# Patient Record
Sex: Female | Born: 1945 | Race: White | Hispanic: No | Smoking: Former smoker
Health system: Southern US, Community
[De-identification: ages and names within clinical notes are randomized; demographics above are authoritative.]

## PROBLEM LIST (undated history)

## (undated) DIAGNOSIS — I10 Essential (primary) hypertension: Secondary | ICD-10-CM

## (undated) DIAGNOSIS — K623 Rectal prolapse: Secondary | ICD-10-CM

## (undated) DIAGNOSIS — M1990 Unspecified osteoarthritis, unspecified site: Secondary | ICD-10-CM

## (undated) DIAGNOSIS — N200 Calculus of kidney: Secondary | ICD-10-CM

## (undated) DIAGNOSIS — K219 Gastro-esophageal reflux disease without esophagitis: Secondary | ICD-10-CM

## (undated) DIAGNOSIS — J45909 Unspecified asthma, uncomplicated: Secondary | ICD-10-CM

## (undated) DIAGNOSIS — G4730 Sleep apnea, unspecified: Secondary | ICD-10-CM

## (undated) DIAGNOSIS — D649 Anemia, unspecified: Secondary | ICD-10-CM

## (undated) DIAGNOSIS — K279 Peptic ulcer, site unspecified, unspecified as acute or chronic, without hemorrhage or perforation: Secondary | ICD-10-CM

## (undated) DIAGNOSIS — F419 Anxiety disorder, unspecified: Secondary | ICD-10-CM

## (undated) DIAGNOSIS — M797 Fibromyalgia: Secondary | ICD-10-CM

## (undated) DIAGNOSIS — E785 Hyperlipidemia, unspecified: Secondary | ICD-10-CM

## (undated) DIAGNOSIS — K8020 Calculus of gallbladder without cholecystitis without obstruction: Secondary | ICD-10-CM

## (undated) DIAGNOSIS — E039 Hypothyroidism, unspecified: Secondary | ICD-10-CM

## (undated) DIAGNOSIS — N390 Urinary tract infection, site not specified: Secondary | ICD-10-CM

## (undated) HISTORY — DX: Gastro-esophageal reflux disease without esophagitis: K21.9

## (undated) HISTORY — DX: Anxiety disorder, unspecified: F41.9

## (undated) HISTORY — PX: SHOULDER ARTHROSCOPY: SHX128

## (undated) HISTORY — DX: Essential (primary) hypertension: I10

## (undated) HISTORY — DX: Hyperlipidemia, unspecified: E78.5

## (undated) HISTORY — DX: Sleep apnea, unspecified: G47.30

## (undated) HISTORY — DX: Calculus of gallbladder without cholecystitis without obstruction: K80.20

## (undated) HISTORY — DX: Unspecified asthma, uncomplicated: J45.909

## (undated) HISTORY — DX: Rectal prolapse: K62.3

## (undated) HISTORY — DX: Anemia, unspecified: D64.9

## (undated) HISTORY — PX: CHOLECYSTECTOMY: SHX55

## (undated) HISTORY — PX: ELBOW ARTHROSCOPY: SUR87

## (undated) HISTORY — DX: Unspecified osteoarthritis, unspecified site: M19.90

## (undated) HISTORY — PX: LAPAROSCOPIC ASSISTED VAGINAL HYSTERECTOMY: SHX5398

## (undated) HISTORY — PX: OTHER SURGICAL HISTORY: SHX169

## (undated) HISTORY — DX: Hypothyroidism, unspecified: E03.9

## (undated) HISTORY — DX: Urinary tract infection, site not specified: N39.0

## (undated) HISTORY — DX: Calculus of kidney: N20.0

## (undated) HISTORY — PX: TONSILLECTOMY: SUR1361

## (undated) HISTORY — DX: Peptic ulcer, site unspecified, unspecified as acute or chronic, without hemorrhage or perforation: K27.9

## (undated) HISTORY — DX: Fibromyalgia: M79.7

---

## 1980-08-28 DIAGNOSIS — E063 Autoimmune thyroiditis: Secondary | ICD-10-CM | POA: Insufficient documentation

## 2000-08-28 DIAGNOSIS — M47819 Spondylosis without myelopathy or radiculopathy, site unspecified: Secondary | ICD-10-CM | POA: Insufficient documentation

## 2011-02-26 DIAGNOSIS — I10 Essential (primary) hypertension: Secondary | ICD-10-CM | POA: Insufficient documentation

## 2011-05-10 DIAGNOSIS — M3501 Sicca syndrome with keratoconjunctivitis: Secondary | ICD-10-CM | POA: Insufficient documentation

## 2011-05-10 DIAGNOSIS — H16229 Keratoconjunctivitis sicca, not specified as Sjogren's, unspecified eye: Secondary | ICD-10-CM | POA: Insufficient documentation

## 2011-05-10 DIAGNOSIS — H04129 Dry eye syndrome of unspecified lacrimal gland: Secondary | ICD-10-CM | POA: Insufficient documentation

## 2011-05-10 DIAGNOSIS — H2510 Age-related nuclear cataract, unspecified eye: Secondary | ICD-10-CM | POA: Insufficient documentation

## 2011-05-27 DIAGNOSIS — M797 Fibromyalgia: Secondary | ICD-10-CM | POA: Insufficient documentation

## 2011-07-02 DIAGNOSIS — K2100 Gastro-esophageal reflux disease with esophagitis, without bleeding: Secondary | ICD-10-CM | POA: Insufficient documentation

## 2012-04-23 DIAGNOSIS — K1120 Sialoadenitis, unspecified: Secondary | ICD-10-CM | POA: Insufficient documentation

## 2012-07-08 DIAGNOSIS — G250 Essential tremor: Secondary | ICD-10-CM | POA: Insufficient documentation

## 2012-11-26 DIAGNOSIS — L609 Nail disorder, unspecified: Secondary | ICD-10-CM | POA: Insufficient documentation

## 2013-01-12 DIAGNOSIS — Z0000 Encounter for general adult medical examination without abnormal findings: Secondary | ICD-10-CM | POA: Insufficient documentation

## 2013-01-12 DIAGNOSIS — R0602 Shortness of breath: Secondary | ICD-10-CM | POA: Insufficient documentation

## 2013-01-12 DIAGNOSIS — E039 Hypothyroidism, unspecified: Secondary | ICD-10-CM | POA: Insufficient documentation

## 2013-05-12 DIAGNOSIS — J45909 Unspecified asthma, uncomplicated: Secondary | ICD-10-CM | POA: Insufficient documentation

## 2013-05-12 DIAGNOSIS — R2689 Other abnormalities of gait and mobility: Secondary | ICD-10-CM | POA: Insufficient documentation

## 2013-05-12 DIAGNOSIS — L900 Lichen sclerosus et atrophicus: Secondary | ICD-10-CM | POA: Insufficient documentation

## 2014-12-27 DIAGNOSIS — B379 Candidiasis, unspecified: Secondary | ICD-10-CM | POA: Insufficient documentation

## 2015-01-24 DIAGNOSIS — M205X9 Other deformities of toe(s) (acquired), unspecified foot: Secondary | ICD-10-CM | POA: Insufficient documentation

## 2015-07-18 DIAGNOSIS — N941 Unspecified dyspareunia: Secondary | ICD-10-CM | POA: Insufficient documentation

## 2015-08-29 DIAGNOSIS — N3281 Overactive bladder: Secondary | ICD-10-CM | POA: Insufficient documentation

## 2015-08-29 DIAGNOSIS — M62838 Other muscle spasm: Secondary | ICD-10-CM | POA: Insufficient documentation

## 2016-07-02 DIAGNOSIS — Z9181 History of falling: Secondary | ICD-10-CM | POA: Insufficient documentation

## 2016-12-31 DIAGNOSIS — H9192 Unspecified hearing loss, left ear: Secondary | ICD-10-CM | POA: Insufficient documentation

## 2016-12-31 DIAGNOSIS — H9193 Unspecified hearing loss, bilateral: Secondary | ICD-10-CM | POA: Insufficient documentation

## 2016-12-31 DIAGNOSIS — G8929 Other chronic pain: Secondary | ICD-10-CM | POA: Insufficient documentation

## 2016-12-31 DIAGNOSIS — E785 Hyperlipidemia, unspecified: Secondary | ICD-10-CM | POA: Insufficient documentation

## 2016-12-31 DIAGNOSIS — R29898 Other symptoms and signs involving the musculoskeletal system: Secondary | ICD-10-CM | POA: Insufficient documentation

## 2017-02-20 DIAGNOSIS — H0288B Meibomian gland dysfunction left eye, upper and lower eyelids: Secondary | ICD-10-CM | POA: Insufficient documentation

## 2017-02-20 DIAGNOSIS — H0288A Meibomian gland dysfunction right eye, upper and lower eyelids: Secondary | ICD-10-CM | POA: Insufficient documentation

## 2017-02-20 DIAGNOSIS — H04123 Dry eye syndrome of bilateral lacrimal glands: Secondary | ICD-10-CM | POA: Insufficient documentation

## 2017-05-21 DIAGNOSIS — H0102B Squamous blepharitis left eye, upper and lower eyelids: Secondary | ICD-10-CM | POA: Insufficient documentation

## 2017-05-21 DIAGNOSIS — H0102A Squamous blepharitis right eye, upper and lower eyelids: Secondary | ICD-10-CM | POA: Insufficient documentation

## 2017-05-21 DIAGNOSIS — R55 Syncope and collapse: Secondary | ICD-10-CM | POA: Insufficient documentation

## 2017-05-21 DIAGNOSIS — R42 Dizziness and giddiness: Secondary | ICD-10-CM | POA: Insufficient documentation

## 2017-05-21 DIAGNOSIS — R200 Anesthesia of skin: Secondary | ICD-10-CM | POA: Insufficient documentation

## 2017-08-19 DIAGNOSIS — M79671 Pain in right foot: Secondary | ICD-10-CM | POA: Insufficient documentation

## 2017-08-19 DIAGNOSIS — M79672 Pain in left foot: Secondary | ICD-10-CM | POA: Insufficient documentation

## 2017-08-28 DIAGNOSIS — R251 Tremor, unspecified: Secondary | ICD-10-CM | POA: Insufficient documentation

## 2018-02-18 DIAGNOSIS — M26623 Arthralgia of bilateral temporomandibular joint: Secondary | ICD-10-CM | POA: Insufficient documentation

## 2019-02-06 DIAGNOSIS — Z634 Disappearance and death of family member: Secondary | ICD-10-CM | POA: Insufficient documentation

## 2019-10-13 DIAGNOSIS — R195 Other fecal abnormalities: Secondary | ICD-10-CM | POA: Insufficient documentation

## 2020-10-10 ENCOUNTER — Encounter: Payer: Self-pay | Admitting: Pulmonary Disease

## 2020-10-10 ENCOUNTER — Ambulatory Visit (INDEPENDENT_AMBULATORY_CARE_PROVIDER_SITE_OTHER): Payer: Medicare Other | Admitting: Pulmonary Disease

## 2020-10-10 ENCOUNTER — Other Ambulatory Visit: Payer: Self-pay

## 2020-10-10 VITALS — BP 128/76 | HR 91 | Temp 98.3°F | Ht 64.0 in | Wt 158.4 lb

## 2020-10-10 DIAGNOSIS — J4540 Moderate persistent asthma, uncomplicated: Secondary | ICD-10-CM | POA: Diagnosis not present

## 2020-10-10 MED ORDER — SPIRIVA RESPIMAT 1.25 MCG/ACT IN AERS: 2.0000 | INHALATION_SPRAY | Freq: Every day | RESPIRATORY_TRACT | 3 refills | Status: DC

## 2020-10-10 MED ORDER — ALBUTEROL SULFATE HFA 108 (90 BASE) MCG/ACT IN AERS: 2.0000 | INHALATION_SPRAY | Freq: Four times a day (QID) | RESPIRATORY_TRACT | 6 refills | Status: DC | PRN

## 2020-10-10 MED ORDER — SPIRIVA RESPIMAT 1.25 MCG/ACT IN AERS: 2.0000 | INHALATION_SPRAY | Freq: Every day | RESPIRATORY_TRACT | 0 refills | Status: DC

## 2020-10-10 NOTE — Progress Notes (Signed)
Synopsis: Referred in January 2022 for shortness of breath  Subjective:   PATIENT ID: Mariah Snyder GENDER: female DOB: Feb 11, 1946, MRN: [id number]   HPI  Chief Complaint  Patient presents with  . Consult    SOB   Mariah Snyder is a 75 year old woman, former smoker with asthma and GERD who is referred to pulmonary clinic for shortness of breath.   She reports having progressive shortness of breath and wheezing since September. She notices these symptoms when she walks. She reports being diagnosed with asthma in 2014 when she initially developed the shortness of breath and wheezing. She has been on advair HFA since then with adquate control of her symptoms.   She tested positive for covid on 10/03/20 which led to fatigue and worsening of her shortness of breath. She also reports sinus congestion and post nasal drip since covid.   Her asthma triggers include seasonal allergies worse in the fall and spring. Strong perfumes, colonges and cigarette smoke also make her breathing worse. She has not required prednisone or antibiotics in the past year.   She has positional sleep apnea based on previous sleep testing and trys to avoid sleeping on her back.   She was exposed to second hand smoke growing up as her father smoked in the home. She also smoked socially in the past having cigarettes when drinking.   Her mother and youngest brother have asthma.   Past Medical History:  Diagnosis Date  . Asthma      Family History  Problem Relation Age of Onset  . Asthma Mother      Social History   Socioeconomic History  . Marital status: Not on file    Spouse name: Not on file  . Number of children: Not on file  . Years of education: Not on file  . Highest education level: Not on file  Occupational History  . Not on file  Tobacco Use  . Smoking status: Former Smoker    Types: Cigarettes  . Smokeless tobacco: Never Used  Substance and Sexual Activity  . Alcohol use: Not on file  . Drug  use: Not on file  . Sexual activity: Not on file  Other Topics Concern  . Not on file  Social History Narrative  . Not on file   Social Determinants of Health   Financial Resource Strain: Not on file  Food Insecurity: Not on file  Transportation Needs: Not on file  Physical Activity: Not on file  Stress: Not on file  Social Connections: Not on file  Intimate Partner Violence: Not on file     Allergies  Allergen Reactions  . Sulfa Antibiotics Other (See Comments)    Causes headaches. Causes headaches. Causes headaches.   Dub Amis Extracts [Gramineae Pollens] Itching and Other (See Comments)    Runny itchy eyes & nose. Rhinitis  Runny itchy eyes & nose. Rhinitis  Rhinitis  Runny itchy eyes & nose. Rhinitis  Runny itchy eyes & nose. Rhinitis   stuffy nose, itchy eyes, asthma   . Molds & Smuts Itching and Other (See Comments)    Runny itchy eyes & nose. Runny itchy eyes & nose. Runny itchy eyes & nose. Runny itchy eyes & nose.      Outpatient Medications Prior to Visit  Medication Sig Dispense Refill  . acetaminophen (TYLENOL) 325 MG tablet Take 650 mg by mouth every 6 (six) hours as needed.    Marland Kitchen amLODipine (NORVASC) 5 MG tablet Take by mouth.    Marland Kitchen  aspirin 81 MG EC tablet Take by mouth.    Marland Kitchen atorvastatin (LIPITOR) 40 MG tablet Take by mouth.    . baclofen (LIORESAL) 10 MG tablet Take by mouth.    . Bepotastine Besilate 1.5 % SOLN Apply to eye.    . busPIRone (BUSPAR) 5 MG tablet 1 3 times daily for anxiety    . conjugated estrogens (PREMARIN) vaginal cream Place vaginally.    . fluticasone-salmeterol (ADVAIR HFA) 230-21 MCG/ACT inhaler Inhale 2 puffs into the lungs 2 (two) times daily.    Marland Kitchen levothyroxine (SYNTHROID) 88 MCG tablet Take by mouth.    Marland Kitchen lisinopril (ZESTRIL) 5 MG tablet Take 5 mg by mouth daily.    . Omega-3 Fatty Acids (FISH OIL) 1000 MG CAPS Take 1 capsule by mouth daily.    Marland Kitchen omeprazole (PRILOSEC) 20 MG capsule Take by mouth.    . pregabalin  (LYRICA) 75 MG capsule Take by mouth.    . propranolol (INDERAL) 10 MG tablet Take by mouth.    . simvastatin (ZOCOR) 20 MG tablet Take by mouth.    . tolterodine (DETROL LA) 4 MG 24 hr capsule Take by mouth.     No facility-administered medications prior to visit.    Review of Systems  Constitutional: Positive for malaise/fatigue. Negative for chills, fever and weight loss.  HENT: Positive for congestion. Negative for sinus pain and sore throat.   Eyes: Negative.   Respiratory: Positive for cough, sputum production, shortness of breath and wheezing. Negative for hemoptysis.   Cardiovascular: Negative for chest pain, palpitations, orthopnea, claudication, leg swelling and PND.  Gastrointestinal: Positive for heartburn. Negative for abdominal pain, nausea and vomiting.  Genitourinary: Negative.   Musculoskeletal: Negative.   Skin: Negative for rash.  Neurological: Negative.   Endo/Heme/Allergies: Negative.   Psychiatric/Behavioral: Negative.    Objective:   Vitals:   10/10/20 1446  BP: 128/76  Pulse: 91  Temp: 98.3 F (36.8 C)  SpO2: 97%  Weight: 158 lb 6.4 oz (71.8 kg)  Height: 5\' 4"  (1.626 m)     Physical Exam Constitutional:      General: She is not in acute distress.    Appearance: She is normal weight. She is not ill-appearing.  HENT:     Head: Normocephalic and atraumatic.     Nose:     Comments: Deferred due to Covid 19 mask requirement    Mouth/Throat:     Comments: Deferred due to Covid 19 mask requirement Eyes:     General: No scleral icterus.    Conjunctiva/sclera: Conjunctivae normal.     Pupils: Pupils are equal, round, and reactive to light.  Cardiovascular:     Rate and Rhythm: Normal rate and regular rhythm.     Pulses: Normal pulses.     Heart sounds: Normal heart sounds. No murmur heard.   Pulmonary:     Effort: Pulmonary effort is normal.     Breath sounds: Normal breath sounds. No wheezing, rhonchi or rales.  Abdominal:     General: Bowel  sounds are normal.     Palpations: Abdomen is soft.  Musculoskeletal:     Right lower leg: No edema.     Left lower leg: No edema.  Lymphadenopathy:     Cervical: No cervical adenopathy.  Skin:    General: Skin is warm and dry.  Neurological:     General: No focal deficit present.     Mental Status: She is alert.  Psychiatric:  Mood and Affect: Mood normal.        Behavior: Behavior normal.        Thought Content: Thought content normal.        Judgment: Judgment normal.    CBC No results found for: WBC, RBC, HGB, HCT, PLT, MCV, MCH, MCHC, RDW, LYMPHSABS, MONOABS, EOSABS, BASOSABS   Chest imaging:  PFT: No flowsheet data found.    Assessment & Plan:   Moderate persistent asthma without complication  Discussion: Nataya Bastedo is a 75 year old woman, former smoker with asthma and GERD who is referred to pulmonary clinic for shortness of breath.   She has moderate persistent asthma based on her current symptoms. She is to continue advair HFA 2 puffs twice daily. We will add LAMA therapy to her regimen and start her on spiriva respimat 2 puffs daily. She can use albuterol inhaler as needed.   We will consider obtaining pulmonary function testing and labs in the future. Her current symptoms may be related to her recent covid 19 infection.   Follow up in 3 months.  Freda Jackson, MD [facility] Office: [phone]   Current Outpatient Medications:  .  acetaminophen (TYLENOL) 325 MG tablet, Take 650 mg by mouth every 6 (six) hours as needed., Disp: , Rfl:  .  albuterol (VENTOLIN HFA) 108 (90 Base) MCG/ACT inhaler, Inhale 2 puffs into the lungs every 6 (six) hours as needed for wheezing or shortness of breath., Disp: 8 g, Rfl: 6 .  amLODipine (NORVASC) 5 MG tablet, Take by mouth., Disp: , Rfl:  .  aspirin 81 MG EC tablet, Take by mouth., Disp: , Rfl:  .  atorvastatin (LIPITOR) 40 MG tablet, Take by mouth., Disp: , Rfl:  .  baclofen (LIORESAL) 10  MG tablet, Take by mouth., Disp: , Rfl:  .  Bepotastine Besilate 1.5 % SOLN, Apply to eye., Disp: , Rfl:  .  busPIRone (BUSPAR) 5 MG tablet, 1 3 times daily for anxiety, Disp: , Rfl:  .  conjugated estrogens (PREMARIN) vaginal cream, Place vaginally., Disp: , Rfl:  .  fluticasone-salmeterol (ADVAIR HFA) 230-21 MCG/ACT inhaler, Inhale 2 puffs into the lungs 2 (two) times daily., Disp: , Rfl:  .  levothyroxine (SYNTHROID) 88 MCG tablet, Take by mouth., Disp: , Rfl:  .  lisinopril (ZESTRIL) 5 MG tablet, Take 5 mg by mouth daily., Disp: , Rfl:  .  Omega-3 Fatty Acids (FISH OIL) 1000 MG CAPS, Take 1 capsule by mouth daily., Disp: , Rfl:  .  omeprazole (PRILOSEC) 20 MG capsule, Take by mouth., Disp: , Rfl:  .  pregabalin (LYRICA) 75 MG capsule, Take by mouth., Disp: , Rfl:  .  propranolol (INDERAL) 10 MG tablet, Take by mouth., Disp: , Rfl:  .  simvastatin (ZOCOR) 20 MG tablet, Take by mouth., Disp: , Rfl:  .  Tiotropium Bromide Monohydrate (SPIRIVA RESPIMAT) 1.25 MCG/ACT AERS, Inhale 2 puffs into the lungs daily., Disp: 12 g, Rfl: 3 .  Tiotropium Bromide Monohydrate (SPIRIVA RESPIMAT) 1.25 MCG/ACT AERS, Inhale 2 puffs into the lungs daily., Disp: 4 g, Rfl: 0 .  tolterodine (DETROL LA) 4 MG 24 hr capsule, Take by mouth., Disp: , Rfl:

## 2020-10-10 NOTE — Patient Instructions (Signed)
Start spiriva respimat inhaler 2 puffs daily  Continue Advair inhaler 2 puffs twice daily  You can use albuterol inhaler 1-2 puffs every 4-6 hours as needed for shortness of breath, cough, wheezing or chest tightness.

## 2020-10-17 ENCOUNTER — Encounter: Payer: Self-pay | Admitting: Pulmonary Disease

## 2020-10-31 ENCOUNTER — Telehealth: Payer: Self-pay | Admitting: Pulmonary Disease

## 2020-10-31 NOTE — Telephone Encounter (Signed)
ATC patient unable to reach LM to call back office (x1)  

## 2020-10-31 NOTE — Telephone Encounter (Signed)
Has a medical provider examined her for this diagnosis? Or is it only based on complaints from the patient? What symptoms has she been having.   If this diagnosis was not made by an in person exam, then please schedule her to be seen by one of our Nurse practitioners this week.  Thanks, Wille Glaser

## 2020-10-31 NOTE — Telephone Encounter (Signed)
Noted.   Mariah Snyder

## 2020-10-31 NOTE — Telephone Encounter (Signed)
Spoke with pt and scheduled her to see Wyn Quaker., FNP on 11/01/20 at 11:30 to evaluate for possible thrush. Pt stated understanding. Nothing further needed at this time.   Routing to Wyn Quaker, FNP as Juluis Rainier

## 2020-10-31 NOTE — Telephone Encounter (Signed)
Spoke with pt who states she is currently using Adavir 2 puffs daily and that it has given her thrush. Pt denies f/n/v/d. Pt states she does rinse mouth thoroughly after Advair use. Dr. Erin Fulling can you please advise.

## 2020-10-31 NOTE — Telephone Encounter (Signed)
Patient is returning phone call. Patient phone number is [phone].

## 2020-11-01 ENCOUNTER — Encounter: Payer: Self-pay | Admitting: Pulmonary Disease

## 2020-11-01 ENCOUNTER — Ambulatory Visit (INDEPENDENT_AMBULATORY_CARE_PROVIDER_SITE_OTHER): Payer: Medicare Other | Admitting: Pulmonary Disease

## 2020-11-01 ENCOUNTER — Other Ambulatory Visit: Payer: Self-pay

## 2020-11-01 VITALS — BP 128/88 | HR 67 | Ht 64.0 in | Wt 157.0 lb

## 2020-11-01 DIAGNOSIS — Z8616 Personal history of COVID-19: Secondary | ICD-10-CM | POA: Insufficient documentation

## 2020-11-01 DIAGNOSIS — J4540 Moderate persistent asthma, uncomplicated: Secondary | ICD-10-CM

## 2020-11-01 DIAGNOSIS — B370 Candidal stomatitis: Secondary | ICD-10-CM | POA: Insufficient documentation

## 2020-11-01 DIAGNOSIS — K219 Gastro-esophageal reflux disease without esophagitis: Secondary | ICD-10-CM | POA: Insufficient documentation

## 2020-11-01 DIAGNOSIS — R490 Dysphonia: Secondary | ICD-10-CM | POA: Insufficient documentation

## 2020-11-01 MED ORDER — NYSTATIN 100000 UNIT/ML MT SUSP: 5.0000 mL | Freq: Four times a day (QID) | OROMUCOSAL | 0 refills | Status: DC

## 2020-11-01 MED ORDER — ADVAIR HFA 230-21 MCG/ACT IN AERO: 2.0000 | INHALATION_SPRAY | Freq: Two times a day (BID) | RESPIRATORY_TRACT | 6 refills | Status: DC

## 2020-11-01 MED ORDER — SPIRIVA RESPIMAT 1.25 MCG/ACT IN AERS: 2.0000 | INHALATION_SPRAY | Freq: Every day | RESPIRATORY_TRACT | 6 refills | Status: DC

## 2020-11-01 NOTE — Assessment & Plan Note (Signed)
Daily reflux symptoms Maintained on PPI No formal gastroenterology eval Symptoms are relieved by Tums  Plan: Continue PPI Would recommend gastroenterology referral, patient declined today and would like to discuss this with primary care Would encourage increasing PPI dose, will defer to primary care based off of patient's request Continue lifestyle recommendation changes as well as food avoidance for foods that can worsen acid reflux

## 2020-11-01 NOTE — Assessment & Plan Note (Signed)
1 week of hoarseness  Plan: If hoarseness persists despite nystatin as well as adequate treatment of acid reflux greater than 6 weeks would recommend referral to ENT for vocal cord visualization

## 2020-11-01 NOTE — Assessment & Plan Note (Signed)
Status post COVID-19 infection in January/2022 Vaccinated including booster Did not require hospitalization  Plan:   continue to clinically monitor

## 2020-11-01 NOTE — Patient Instructions (Addendum)
You were seen today by Lauraine Rinne, NP  for:   1. Moderate persistent asthma without complication  Continue advair   Spiriva Respimat 1.25 >>> 2 puffs daily >>> Do this every day >>>This is not a rescue inhaler  Only use your albuterol as a rescue medication to be used if you can't catch your breath by resting or doing a relaxed purse lip breathing pattern.  - The less you use it, the better it will work when you need it. - Ok to use up to 2 puffs  every 4 hours if you must but call for immediate appointment if use goes up over your usual need - Don't leave home without it !!  (think of it like the spare tire for your car)   2. Oral thrush  - nystatin (MYCOSTATIN) 100000 UNIT/ML suspension; Take 5 mLs (500,000 Units total) by mouth 4 (four) times daily.  Dispense: 60 mL; Refill: 0  3. Gastroesophageal reflux disease, unspecified whether esophagitis present  Omeprazole 20 mg tablet  >>>Please take 1 tablet daily 15 minutes to 30 minutes before your first meal of the day as well as before your other medications >>>Try to take at the same time each day >>>take this medication daily  GERD management: >>>Avoid laying flat until 2 hours after meals >>>Elevate head of the bed including entire chest >>>Reduce size of meals and amount of fat, acid, spices, caffeine and sweets >>>If you are smoking, Please stop! >>>Decrease alcohol consumption >>>Work on maintaining a healthy weight with normal BMI   At your request will send my note to your PCP for her to review and she can decide whether or not you should be referred to gastroenterology  4. History of COVID-19  We will continue to clinically monitor  5. Hoarseness   We will continue to monitor this.  If hoarseness persists greater than 6 weeks will recommend referring you to ENT   We recommend today:   Meds ordered this encounter  Medications  . DISCONTD: nystatin (MYCOSTATIN) 100000 UNIT/ML suspension    Sig: Take 5 mLs  (500,000 Units total) by mouth 4 (four) times daily.    Dispense:  60 mL    Refill:  0  . nystatin (MYCOSTATIN) 100000 UNIT/ML suspension    Sig: Take 5 mLs (500,000 Units total) by mouth 4 (four) times daily.    Dispense:  60 mL    Refill:  0    Follow Up:    Return in about 1 week (around 11/08/2020), or if symptoms worsen or fail to improve, for Follow up with Dr. Erin Fulling.   Notification of test results are managed in the following manner: If there are  any recommendations or changes to the  plan of care discussed in office today,  we will contact you and let you know what they are. If you do not hear from [place], then your results are normal and you can view them through your  MyChart account , or a letter will be sent to you. Thank you again for trusting [place] with your care  - Thank you, [facility]    It is flu season:   >>> Best ways to protect herself from the flu: Receive the yearly flu vaccine, practice good hand hygiene washing with soap and also using hand sanitizer when available, eat a nutritious meals, get adequate rest, hydrate appropriately       Please contact the office if your symptoms worsen or you have concerns that you  are not improving.   Thank you for choosing [facility] for your healthcare, and for allowing [place] to partner with you on your healthcare journey. I am thankful to be able to provide care to you today.   Wyn Quaker FNP-C    Gastroesophageal Reflux Disease, Adult  Gastroesophageal reflux (GER) happens when acid from the stomach flows up into the tube that connects the mouth and the stomach (esophagus). Normally, food travels down the esophagus and stays in the stomach to be digested. With GER, food and stomach acid sometimes move back up into the esophagus. You may have a disease called gastroesophageal reflux disease (GERD) if the reflux:  Happens often.  Causes frequent or very bad symptoms.  Causes problems such as damage to  the esophagus. When this happens, the esophagus becomes sore and swollen. Over time, GERD can make small holes (ulcers) in the lining of the esophagus. What are the causes? This condition is caused by a problem with the muscle between the esophagus and the stomach. When this muscle is weak or not normal, it does not close properly to keep food and acid from coming back up from the stomach. The muscle can be weak because of:  Tobacco use.  Pregnancy.  Having a certain type of hernia (hiatal hernia).  Alcohol use.  Certain foods and drinks, such as coffee, chocolate, onions, and peppermint. What increases the risk?  Being overweight.  Having a disease that affects your connective tissue.  Taking NSAIDs, such a ibuprofen. What are the signs or symptoms?  Heartburn.  Difficult or painful swallowing.  The feeling of having a lump in the throat.  A bitter taste in the mouth.  Bad breath.  Having a lot of saliva.  Having an upset or bloated stomach.  Burping.  Chest pain. Different conditions can cause chest pain. Make sure you see your doctor if you have chest pain.  Shortness of breath or wheezing.  A long-term cough or a cough at night.  Wearing away of the surface of teeth (tooth enamel).  Weight loss. How is this treated?  Making changes to your diet.  Taking medicine.  Having surgery. Treatment will depend on how bad your symptoms are. Follow these instructions at home: Eating and drinking  Follow a diet as told by your doctor. You may need to avoid foods and drinks such as: ? Coffee and tea, with or without caffeine. ? Drinks that contain alcohol. ? Energy drinks and sports drinks. ? Bubbly (carbonated) drinks or sodas. ? Chocolate and cocoa. ? Peppermint and mint flavorings. ? Garlic and onions. ? Horseradish. ? Spicy and acidic foods. These include peppers, chili powder, curry powder, vinegar, hot sauces, and BBQ sauce. ? Citrus fruit juices and  citrus fruits, such as oranges, lemons, and limes. ? Tomato-based foods. These include red sauce, chili, salsa, and pizza with red sauce. ? Fried and fatty foods. These include donuts, french fries, potato chips, and high-fat dressings. ? High-fat meats. These include hot dogs, rib eye steak, sausage, ham, and bacon. ? High-fat dairy items, such as whole milk, butter, and cream cheese.  Eat small meals often. Avoid eating large meals.  Avoid drinking large amounts of liquid with your meals.  Avoid eating meals during the 2-3 hours before bedtime.  Avoid lying down right after you eat.  Do not exercise right after you eat.   Lifestyle  Do not smoke or use any products that contain nicotine or tobacco. If you need help  quitting, ask your doctor.  Try to lower your stress. If you need help doing this, ask your doctor.  If you are overweight, lose an amount of weight that is healthy for you. Ask your doctor about a safe weight loss goal.   General instructions  Pay attention to any changes in your symptoms.  Take over-the-counter and prescription medicines only as told by your doctor.  Do not take aspirin, ibuprofen, or other NSAIDs unless your doctor says it is okay.  Wear loose clothes. Do not wear anything tight around your waist.  Raise (elevate) the head of your bed about 6 inches (15 cm). You may need to use a wedge to do this.  Avoid bending over if this makes your symptoms worse.  Keep all follow-up visits. Contact a doctor if:  You have new symptoms.  You lose weight and you do not know why.  You have trouble swallowing or it hurts to swallow.  You have wheezing or a cough that keeps happening.  You have a hoarse voice.  Your symptoms do not get better with treatment. Get help right away if:  You have sudden pain in your arms, neck, jaw, teeth, or back.  You suddenly feel sweaty, dizzy, or light-headed.  You have chest pain or shortness of breath.  You  vomit and the vomit is green, yellow, or black, or it looks like blood or coffee grounds.  You faint.  Your poop (stool) is red, bloody, or black.  You cannot swallow, drink, or eat. These symptoms may represent a serious problem that is an emergency. Do not wait to see if the symptoms will go away. Get medical help right away. Call your local emergency services (911 in the [place] Do not drive yourself to the hospital. Summary  If a person has gastroesophageal reflux disease (GERD), food and stomach acid move back up into the esophagus and cause symptoms or problems such as damage to the esophagus.  Treatment will depend on how bad your symptoms are.  Follow a diet as told by your doctor.  Take all medicines only as told by your doctor. This information is not intended to replace advice given to you by your health care provider. Make sure you discuss any questions you have with your health care provider. Document Revised: 03/14/2020 Document Reviewed: 03/14/2020 Elsevier Patient Education  2021 [employer] for Gastroesophageal Reflux Disease, Adult When you have gastroesophageal reflux disease (GERD), the foods you eat and your eating habits are very important. Choosing the right foods can help ease your discomfort. Think about working with a food expert (dietitian) to help you make good choices. What are tips for following this plan? Reading food labels  Look for foods that are low in saturated fat. Foods that may help with your symptoms include: ? Foods that have less than 5% of daily value (DV) of fat. ? Foods that have 0 grams of trans fat. Cooking  Do not fry your food.  Cook your food by baking, steaming, grilling, or broiling. These are all methods that do not need a lot of fat for cooking.  To add flavor, try to use herbs that are low in spice and acidity. Meal planning  Choose healthy foods that are low in fat, such as: ? Fruits and  vegetables. ? Whole grains. ? Low-fat dairy products. ? Lean meats, fish, and poultry.  Eat small meals often instead of eating 3 large meals each day. Eat your  meals slowly in a place where you are relaxed. Avoid bending over or lying down until 2-3 hours after eating.  Limit high-fat foods such as fatty meats or fried foods.  Limit your intake of fatty foods, such as oils, butter, and shortening.  Avoid the following as told by your doctor: ? Foods that cause symptoms. These may be different for different people. Keep a food diary to keep track of foods that cause symptoms. ? Alcohol. ? Drinking a lot of liquid with meals. ? Eating meals during the 2-3 hours before bed.   Lifestyle  Stay at a healthy weight. Ask your doctor what weight is healthy for you. If you need to lose weight, work with your doctor to do so safely.  Exercise for at least 30 minutes on 5 or more days each week, or as told by your doctor.  Wear loose-fitting clothes.  Do not smoke or use any products that contain nicotine or tobacco. If you need help quitting, ask your doctor.  Sleep with the head of your bed higher than your feet. Use a wedge under the mattress or blocks under the bed frame to raise the head of the bed.  Chew sugar-free gum after meals. What foods should eat? Eat a healthy, well-balanced diet of fruits, vegetables, whole grains, low-fat dairy products, lean meats, fish, and poultry. Each person is different. Foods that may cause symptoms in one person may not cause any symptoms in another person. Work with your doctor to find foods that are safe for you. The items listed above may not be a complete list of what you can eat and drink. Contact a food expert for more options.   What foods should I avoid? Limiting some of these foods may help in managing the symptoms of GERD. Everyone is different. Talk with a food expert or your doctor to help you find the exact foods to avoid, if  any. Fruits Any fruits prepared with added fat. Any fruits that cause symptoms. For some people, this may include citrus fruits, such as oranges, grapefruit, pineapple, and lemons. Vegetables Deep-fried vegetables. [place] fries. Any vegetables prepared with added fat. Any vegetables that cause symptoms. For some people, this may include tomatoes and tomato products, chili peppers, onions and garlic, and horseradish. Grains Pastries or quick breads with added fat. Meats and other proteins High-fat meats, such as fatty beef or pork, hot dogs, ribs, ham, sausage, salami, and bacon. Fried meat or protein, including fried fish and fried chicken. Nuts and nut butters, in large amounts. Dairy Whole milk and chocolate milk. Sour cream. Cream. Ice cream. Cream cheese. Milkshakes. Fats and oils Butter. Margarine. Shortening. Ghee. Beverages Coffee and tea, with or without caffeine. Carbonated beverages. Sodas. Energy drinks. Fruit juice made with acidic fruits, such as orange or grapefruit. Tomato juice. Alcoholic drinks. Sweets and desserts Chocolate and cocoa. Donuts. Seasonings and condiments Pepper. Peppermint and spearmint. Added salt. Any condiments, herbs, or seasonings that cause symptoms. For some people, this may include curry, hot sauce, or vinegar-based salad dressings. The items listed above may not be a complete list of what you should not eat and drink. Contact a food expert for more options. Questions to ask your doctor Diet and lifestyle changes are often the first steps that are taken to manage symptoms of GERD. If diet and lifestyle changes do not help, talk with your doctor about taking medicines. Where to find more information  International Foundation for Gastrointestinal Disorders: aboutgerd.org Summary  When you  have GERD, food and lifestyle choices are very important in easing your symptoms.  Eat small meals often instead of 3 large meals a day. Eat your meals slowly and  in a place where you are relaxed.  Avoid bending over or lying down until 2-3 hours after eating.  Limit high-fat foods such as fatty meats or fried foods. This information is not intended to replace advice given to you by your health care provider. Make sure you discuss any questions you have with your health care provider. Document Revised: 03/14/2020 Document Reviewed: 03/14/2020 Elsevier Patient Education  [place].

## 2020-11-01 NOTE — Assessment & Plan Note (Signed)
Mild erythema in pharynx No visible Candida on physical exam Patient adamant that dentist all white patches yesterday  Plan: Will treat with nystatin We will hold off on systemic antifungals Patient requesting 1 week follow-up, okay to coordinate Explained to patient that likely I believe the redness/hoarseness in the macular throat is simply related to her persisting acid reflux that is poorly managed

## 2020-11-01 NOTE — Assessment & Plan Note (Signed)
Plan: Continue Advair Continue Spiriva Continue oral rinse

## 2020-11-01 NOTE — Progress Notes (Signed)
@Patient  ID: Mariah Snyder, female    DOB: [birth year], 75 y.o.   MRN: [id number]  Chief Complaint  Patient presents with  . Follow-up    Reports her Dentist  thinks she has developed thrush from Spiriva. Started spiriva 10/10/20. Reports dentists reports she has thrush in the back of the throat. She has been using advair for years so she rinses throughly. She is now using advair and spiriva. She thinks she may have developed heart burn from the spiriva as well. She stopped using the spiriva 10/28/20.      Referring provider: Esmond Harps, MD  HPI:  75 year old female former smoker [facility]  PMH: GERD, history of COVID-19 Smoker/ Smoking History: Former smoker Maintenance: Advair, Spiriva Respimat 1.25 Pt of: Dr. Erin Fulling  11/01/2020  - Visit   75 year old female former smoker [facility] for asthma.  Last seen by Dr. Erin Fulling in January/2022.  At that office visit she was encouraged to trial Spiriva Respimat 1.25 in addition to her Advair.  Patient reports that she saw her dentist yesterday who diagnosed her with thrush.  She reports that she had white patchy areas in the back of her throat at that point in time.  She is also been dealing with ongoing issues with acid reflux.  Primary care has been managing this.  She has not yet been seen by gastroenterologist.  She reports that primary care just recently increased her PPI to omeprazole 20 mg twice daily.  Previously she was taking omeprazole 20 mg in the afternoon.  Patient also reports that she has had hoarseness over the last week.  She attributes this to her recent diagnosis of thrush.  She contacted our office yesterday and she was scheduled for an acute visit at the request of Dr. Erin Fulling.  Patient is unsure yet if the Spiriva Respimat was helping with her breathing.  She feels that it may have.  She is concerned because she feels that the Spiriva Respimat may have caused the thrush.  We will discuss this today.  Patient  also had COVID-19 in January/2022.  This was managed in the outpatient setting without any antibiotics or prednisone.  Did not require hospitalization.  Patient was vaccinated including booster.  Questionaires / Pulmonary Flowsheets:   ACT:  No flowsheet data found.  MMRC: No flowsheet data found.  Epworth:  No flowsheet data found.  Tests:   FENO:  No results found for: NITRICOXIDE  PFT: No flowsheet data found.  WALK:  SIX MIN WALK 11/01/2020  Tech Comments: Patient tolerated walk well    Imaging: No results found.  Lab Results:  CBC No results found for: WBC, RBC, HGB, HCT, PLT, MCV, MCH, MCHC, RDW, LYMPHSABS, MONOABS, EOSABS, BASOSABS  BMET No results found for: NA, K, CL, CO2, GLUCOSE, BUN, CREATININE, CALCIUM, GFRNONAA, GFRAA  BNP No results found for: BNP  ProBNP No results found for: PROBNP  Specialty Problems      Pulmonary Problems   Moderate persistent asthma without complication      Allergies  Allergen Reactions  . Sulfa Antibiotics Other (See Comments)    Causes headaches. Causes headaches. Causes headaches.   Dub Amis Extracts [Gramineae Pollens] Itching and Other (See Comments)    Runny itchy eyes & nose. Rhinitis  Runny itchy eyes & nose. Rhinitis  Rhinitis  Runny itchy eyes & nose. Rhinitis  Runny itchy eyes & nose. Rhinitis   stuffy nose, itchy eyes, asthma   . Molds & Smuts Itching  and Other (See Comments)    Runny itchy eyes & nose. Runny itchy eyes & nose. Runny itchy eyes & nose. Runny itchy eyes & nose.     Immunization History  Administered Date(s) Administered  . Moderna Sars-Covid-2 Vaccination 10/31/2019, 11/28/2019, 07/26/2020    Past Medical History:  Diagnosis Date  . Asthma     Tobacco History: Social History   Tobacco Use  Smoking Status Former Smoker  . Types: Cigarettes  Smokeless Tobacco Never Used   Counseling given: Not Answered   Continue to not smoke  Outpatient Encounter  Medications as of 11/01/2020  Medication Sig  . acetaminophen (TYLENOL) 325 MG tablet Take 650 mg by mouth every 6 (six) hours as needed.  Marland Kitchen albuterol (VENTOLIN HFA) 108 (90 Base) MCG/ACT inhaler Inhale 2 puffs into the lungs every 6 (six) hours as needed for wheezing or shortness of breath.  Marland Kitchen amLODipine (NORVASC) 5 MG tablet Take by mouth.  Marland Kitchen aspirin 81 MG EC tablet Take by mouth.  Marland Kitchen atorvastatin (LIPITOR) 40 MG tablet Take by mouth.  . baclofen (LIORESAL) 10 MG tablet Take by mouth.  . Bepotastine Besilate 1.5 % SOLN Apply to eye.  . busPIRone (BUSPAR) 5 MG tablet 1 3 times daily for anxiety  . conjugated estrogens (PREMARIN) vaginal cream Place vaginally.  . fluticasone-salmeterol (ADVAIR HFA) 230-21 MCG/ACT inhaler Inhale 2 puffs into the lungs 2 (two) times daily.  . fluticasone-salmeterol (ADVAIR HFA) 230-21 MCG/ACT inhaler Inhale 2 puffs into the lungs 2 (two) times daily.  Marland Kitchen levothyroxine (SYNTHROID) 88 MCG tablet Take by mouth.  Marland Kitchen lisinopril (ZESTRIL) 5 MG tablet Take 5 mg by mouth daily.  Marland Kitchen nystatin (MYCOSTATIN) 100000 UNIT/ML suspension Take 5 mLs (500,000 Units total) by mouth 4 (four) times daily.  . Omega-3 Fatty Acids (FISH OIL) 1000 MG CAPS Take 1 capsule by mouth daily.  Marland Kitchen omeprazole (PRILOSEC) 20 MG capsule Take by mouth.  . pregabalin (LYRICA) 75 MG capsule Take by mouth.  . propranolol (INDERAL) 10 MG tablet Take by mouth.  . simvastatin (ZOCOR) 20 MG tablet Take by mouth.  . Tiotropium Bromide Monohydrate (SPIRIVA RESPIMAT) 1.25 MCG/ACT AERS Inhale 2 puffs into the lungs daily.  . Tiotropium Bromide Monohydrate (SPIRIVA RESPIMAT) 1.25 MCG/ACT AERS Inhale 2 puffs into the lungs daily.  . Tiotropium Bromide Monohydrate (SPIRIVA RESPIMAT) 1.25 MCG/ACT AERS Inhale 2 puffs into the lungs daily.  . [DISCONTINUED] nystatin (MYCOSTATIN) 100000 UNIT/ML suspension Take 5 mLs (500,000 Units total) by mouth 4 (four) times daily.  Marland Kitchen tolterodine (DETROL LA) 4 MG 24 hr capsule Take by  mouth. (Patient not taking: Reported on 11/01/2020)   No facility-administered encounter medications on file as of 11/01/2020.     Review of Systems  Review of Systems  Constitutional: Negative for activity change, fatigue and fever.  HENT: Positive for trouble swallowing and voice change. Negative for sinus pressure, sinus pain and sore throat.   Respiratory: Negative for cough, shortness of breath and wheezing.   Cardiovascular: Negative for chest pain and palpitations.  Gastrointestinal: Negative for diarrhea, nausea and vomiting.       +gerd  Musculoskeletal: Negative for arthralgias.  Neurological: Negative for dizziness.  Psychiatric/Behavioral: Negative for sleep disturbance. The patient is not nervous/anxious.      Physical Exam  BP 128/88   Pulse 67   Ht 5\' 4"  (1.626 m)   Wt 157 lb (71.2 kg)   SpO2 99%   BMI 26.95 kg/m   Wt Readings from Last 5 Encounters:  11/01/20 157 lb (71.2 kg)  10/10/20 158 lb 6.4 oz (71.8 kg)    BMI Readings from Last 5 Encounters:  11/01/20 26.95 kg/m  10/10/20 27.19 kg/m     Physical Exam Vitals and nursing note reviewed.  Constitutional:      General: She is not in acute distress.    Appearance: Normal appearance.  HENT:     Head: Normocephalic and atraumatic.     Right Ear: Tympanic membrane, ear canal and external ear normal. There is no impacted cerumen.     Left Ear: Tympanic membrane, ear canal and external ear normal. There is no impacted cerumen.     Nose: Nose normal. No congestion.     Mouth/Throat:     Mouth: Mucous membranes are moist.     Pharynx: Oropharynx is clear. Posterior oropharyngeal erythema present.     Comments: No visible Candida on physical exam.  Patient adamant during physical exam that dental provider saw white patchy areas in back of throat yesterday.  Eyes:     Pupils: Pupils are equal, round, and reactive to light.  Cardiovascular:     Rate and Rhythm: Normal rate and regular rhythm.      Pulses: Normal pulses.     Heart sounds: Normal heart sounds. No murmur heard.   Pulmonary:     Effort: Pulmonary effort is normal. No respiratory distress.     Breath sounds: Normal breath sounds. No decreased air movement. No decreased breath sounds, wheezing or rales.  Musculoskeletal:     Cervical back: Normal range of motion.  Skin:    General: Skin is warm and dry.     Capillary Refill: Capillary refill takes less than 2 seconds.  Neurological:     General: No focal deficit present.     Mental Status: She is alert and oriented to person, place, and time. Mental status is at baseline.     Gait: Gait normal.  Psychiatric:        Mood and Affect: Mood normal.        Behavior: Behavior normal.        Thought Content: Thought content normal.        Judgment: Judgment normal.       Assessment & Plan:   Moderate persistent asthma without complication Plan: Continue Advair Continue Spiriva Continue oral rinse   GERD (gastroesophageal reflux disease) Daily reflux symptoms Maintained on PPI No formal gastroenterology eval Symptoms are relieved by Tums  Plan: Continue PPI Would recommend gastroenterology referral, patient declined today and would like to discuss this with primary care Would encourage increasing PPI dose, will defer to primary care based off of patient's request Continue lifestyle recommendation changes as well as food avoidance for foods that can worsen acid reflux  Oral thrush Mild erythema in pharynx No visible Candida on physical exam Patient adamant that dentist all white patches yesterday  Plan: Will treat with nystatin We will hold off on systemic antifungals Patient requesting 1 week follow-up, okay to coordinate Explained to patient that likely I believe the redness/hoarseness in the macular throat is simply related to her persisting acid reflux that is poorly managed  History of COVID-19 Status post COVID-19 infection in  January/2022 Vaccinated including booster Did not require hospitalization  Plan:   continue to clinically monitor  Hoarseness 1 week of hoarseness  Plan: If hoarseness persists despite nystatin as well as adequate treatment of acid reflux greater than 6 weeks would recommend referral to ENT for vocal cord  visualization    Return in about 1 week (around 11/08/2020), or if symptoms worsen or fail to improve, for Follow up with Dr. Erin Fulling.   Lauraine Rinne, NP 11/01/2020   This appointment required 32 minutes of patient care (this includes precharting, chart review, review of results, face-to-face care, etc.).

## 2020-11-08 ENCOUNTER — Encounter: Payer: Self-pay | Admitting: Pulmonary Disease

## 2020-11-08 ENCOUNTER — Other Ambulatory Visit: Payer: Self-pay

## 2020-11-08 ENCOUNTER — Ambulatory Visit (INDEPENDENT_AMBULATORY_CARE_PROVIDER_SITE_OTHER): Payer: Medicare Other | Admitting: Pulmonary Disease

## 2020-11-08 VITALS — BP 126/82 | HR 82 | Temp 97.0°F | Ht 64.0 in | Wt 155.6 lb

## 2020-11-08 DIAGNOSIS — K219 Gastro-esophageal reflux disease without esophagitis: Secondary | ICD-10-CM

## 2020-11-08 DIAGNOSIS — J4540 Moderate persistent asthma, uncomplicated: Secondary | ICD-10-CM | POA: Diagnosis not present

## 2020-11-08 DIAGNOSIS — R0982 Postnasal drip: Secondary | ICD-10-CM | POA: Diagnosis not present

## 2020-11-08 MED ORDER — FLUTICASONE PROPIONATE 50 MCG/ACT NA SUSP: 1.0000 | Freq: Every day | NASAL | 2 refills | Status: DC

## 2020-11-08 NOTE — Patient Instructions (Signed)
Continue advair 2 puffs twice daily - rinse mouth after each use  Use albuterol as needed  Start fluticasone nasal spray, 1 spray per nostril daily - rinse mouth out after each use  Call in 1 month if continue to experience post-nasal drainage and we will add ipratropium nasal spray

## 2020-11-08 NOTE — Progress Notes (Signed)
Synopsis: Referred in January 2022 for shortness of breath  Subjective:   PATIENT ID: Mariah Snyder GENDER: female DOB: [birth year], MRN: [id number]   HPI  Chief Complaint  Patient presents with  . Follow-up    1wk f/u. States she has been feeling better since last week. States the thrush is under control.    Mariah Snyder is a 75 year old woman, former smoker with asthma and GERD who returns to pulmonary clinic for asthma and recent issues since starting spiraiva respimat.   She had a dentist appointment who reported she had concerning findings for thrush with white patches and redness at the right posterior area of her throat. There was concern that this was related to the spiriva inhaler she was recently started on. She had an appointment with Wyn Quaker, NP on 11/01/20 for evaluation of potential medication reaction.   Prior to her dentist raising concern for thrush, she had no issues of sore throat or oral irritation. She has been on advair for many years and consistently rinses her mouth out and has not previously had issues with thrush.   The spiriva inhaler was stopped and she was treated with 2-3 days of nystatin.   She has continued on the advair and denies any issues with her breathing currently. She is not experiencing the wheezing she previously complained of.   She complains of cough and post-nasal sinus drainage. The cough is worse in the mornings with a whitish sputum.   OV 10/10/20 She reports having progressive shortness of breath and wheezing since September. She notices these symptoms when she walks. She reports being diagnosed with asthma in 2014 when she initially developed the shortness of breath and wheezing. She has been on advair HFA since then with adquate control of her symptoms.   She tested positive for covid on 10/03/20 which led to fatigue and worsening of her shortness of breath. She also reports sinus congestion and post nasal drip since covid.   Her asthma  triggers include seasonal allergies worse in the fall and spring. Strong perfumes, colonges and cigarette smoke also make her breathing worse. She has not required prednisone or antibiotics in the past year.   She has positional sleep apnea based on previous sleep testing and trys to avoid sleeping on her back.   She was exposed to second hand smoke growing up as her father smoked in the home. She also smoked socially in the past having cigarettes when drinking.   Her mother and youngest brother have asthma.   Past Medical History:  Diagnosis Date  . Asthma      Family History  Problem Relation Age of Onset  . Asthma Mother      Social History   Socioeconomic History  . Marital status: Widowed    Spouse name: Not on file  . Number of children: Not on file  . Years of education: Not on file  . Highest education level: Not on file  Occupational History  . Not on file  Tobacco Use  . Smoking status: Former Smoker    Types: Cigarettes  . Smokeless tobacco: Never Used  Substance and Sexual Activity  . Alcohol use: Not on file  . Drug use: Not on file  . Sexual activity: Not on file  Other Topics Concern  . Not on file  Social History Narrative  . Not on file   Social Determinants of Health   Financial Resource Strain: Not on file  Food Insecurity: Not on  file  Transportation Needs: Not on file  Physical Activity: Not on file  Stress: Not on file  Social Connections: Not on file  Intimate Partner Violence: Not on file     Allergies  Allergen Reactions  . Sulfa Antibiotics Other (See Comments)    Causes headaches. Causes headaches. Causes headaches.   Dub Amis Extracts [Gramineae Pollens] Itching and Other (See Comments)    Runny itchy eyes & nose. Rhinitis  Runny itchy eyes & nose. Rhinitis  Rhinitis  Runny itchy eyes & nose. Rhinitis  Runny itchy eyes & nose. Rhinitis   stuffy nose, itchy eyes, asthma   . Molds & Smuts Itching and Other (See Comments)     Runny itchy eyes & nose. Runny itchy eyes & nose. Runny itchy eyes & nose. Runny itchy eyes & nose.      Outpatient Medications Prior to Visit  Medication Sig Dispense Refill  . acetaminophen (TYLENOL) 325 MG tablet Take 650 mg by mouth every 6 (six) hours as needed.    Marland Kitchen albuterol (VENTOLIN HFA) 108 (90 Base) MCG/ACT inhaler Inhale 2 puffs into the lungs every 6 (six) hours as needed for wheezing or shortness of breath. 8 g 6  . amLODipine (NORVASC) 5 MG tablet Take by mouth.    Marland Kitchen aspirin 81 MG EC tablet Take by mouth.    Marland Kitchen atorvastatin (LIPITOR) 40 MG tablet Take by mouth.    . baclofen (LIORESAL) 10 MG tablet Take by mouth.    . Bepotastine Besilate 1.5 % SOLN Apply to eye.    . busPIRone (BUSPAR) 5 MG tablet 1 3 times daily for anxiety    . conjugated estrogens (PREMARIN) vaginal cream Place vaginally.    . darifenacin (ENABLEX) 15 MG 24 hr tablet Take 1 tablet by mouth daily.    . fluticasone-salmeterol (ADVAIR HFA) 230-21 MCG/ACT inhaler Inhale 2 puffs into the lungs 2 (two) times daily. 1 each 6  . levothyroxine (SYNTHROID) 88 MCG tablet Take by mouth.    Marland Kitchen lisinopril (ZESTRIL) 5 MG tablet Take 5 mg by mouth daily.    Marland Kitchen nystatin (MYCOSTATIN) 100000 UNIT/ML suspension Take 5 mLs (500,000 Units total) by mouth 4 (four) times daily. 60 mL 0  . Omega-3 Fatty Acids (FISH OIL) 1000 MG CAPS Take 1 capsule by mouth daily.    Marland Kitchen omeprazole (PRILOSEC) 20 MG capsule Take by mouth.    . pregabalin (LYRICA) 75 MG capsule Take by mouth.    . propranolol (INDERAL) 10 MG tablet Take by mouth.    . simvastatin (ZOCOR) 20 MG tablet Take by mouth.    . fluticasone-salmeterol (ADVAIR HFA) 230-21 MCG/ACT inhaler Inhale 2 puffs into the lungs 2 (two) times daily.    . Tiotropium Bromide Monohydrate (SPIRIVA RESPIMAT) 1.25 MCG/ACT AERS Inhale 2 puffs into the lungs daily. 12 g 3  . Tiotropium Bromide Monohydrate (SPIRIVA RESPIMAT) 1.25 MCG/ACT AERS Inhale 2 puffs into the lungs daily. 4 g 0  .  Tiotropium Bromide Monohydrate (SPIRIVA RESPIMAT) 1.25 MCG/ACT AERS Inhale 2 puffs into the lungs daily. 1 each 6  . tolterodine (DETROL LA) 4 MG 24 hr capsule Take by mouth. (Patient not taking: Reported on 11/01/2020)     No facility-administered medications prior to visit.    Review of Systems  Constitutional: Positive for malaise/fatigue. Negative for chills, fever and weight loss.  HENT: Positive for congestion. Negative for sinus pain and sore throat.   Eyes: Negative.   Respiratory: Positive for cough, sputum production, shortness of  breath and wheezing. Negative for hemoptysis.   Cardiovascular: Negative for chest pain, palpitations, orthopnea, claudication, leg swelling and PND.  Gastrointestinal: Positive for heartburn. Negative for abdominal pain, nausea and vomiting.  Genitourinary: Negative.   Musculoskeletal: Negative.   Skin: Negative for rash.  Neurological: Negative.   Endo/Heme/Allergies: Negative.   Psychiatric/Behavioral: Negative.    Objective:   Vitals:   11/08/20 1204  BP: 126/82  Pulse: 82  Temp: (!) 97 F (36.1 C)  TempSrc: Temporal  SpO2: 96%  Weight: 155 lb 9.6 oz (70.6 kg)  Height: 5\' 4"  (1.626 m)     Physical Exam Constitutional:      Appearance: Normal appearance. She is normal weight.  HENT:     Head: Normocephalic and atraumatic.     Nose: Nose normal.     Mouth/Throat:     Mouth: Mucous membranes are moist.     Pharynx: Posterior oropharyngeal erythema (mild) present.     Comments: Whitish secretions noted along the right posterior area of her throat.  No thrush noted Eyes:     General: No scleral icterus.    Conjunctiva/sclera: Conjunctivae normal.  Cardiovascular:     Rate and Rhythm: Normal rate and regular rhythm.     Pulses: Normal pulses.     Heart sounds: Normal heart sounds.  Pulmonary:     Effort: Pulmonary effort is normal.     Breath sounds: Normal breath sounds.  Abdominal:     General: Bowel sounds are normal.      Palpations: Abdomen is soft.  Musculoskeletal:     Right lower leg: No edema.     Left lower leg: No edema.  Skin:    General: Skin is warm and dry.  Neurological:     General: No focal deficit present.  Psychiatric:        Mood and Affect: Mood normal.        Behavior: Behavior normal.        Thought Content: Thought content normal.        Judgment: Judgment normal.     CBC No results found for: WBC, RBC, HGB, HCT, PLT, MCV, MCH, MCHC, RDW, LYMPHSABS, MONOABS, EOSABS, BASOSABS   Chest imaging: CXR 10/03/2017 Radiographically clear lungs.  No pleural effusion or pneumothorax.  Cardiomediastinal silhouette unremarkable.   PFT: No flowsheet data found.    Assessment & Plan:   Post-nasal drip - Plan: fluticasone (FLONASE) 50 MCG/ACT nasal spray  Moderate persistent asthma without complication  Gastroesophageal reflux disease, unspecified whether esophagitis present  Discussion: Mariah Snyder is a 75 year old woman, former smoker with asthma and GERD who returns to pulmonary clinic for asthma and recent issues since starting spiraiva respimat.    We discussed how thrush is not typically a side effect of spiriva and that it is more likely secondary to advair use with the corticosteroid.   I believe the findings noted at her dentist's office are possibly related to post-nasal drainage leading to erythema and white secretions appeared as thrush. All in all, it is difficult to say but the patient is overall better at this time without issues of wheezing.   She is to continue with advair HFA 2 puffs twice daily and as need albuterol. Her increase in wheezing was likely related to her recent covid infeciton and post-nasal drainage.   She continues to have post-nasal drainage. We will start her on flonase 1 spray per day and add ipratropium nasal spray if needed in the future.  Follow up in 4 months.   She has moderate persistent asthma based on her current symptoms. She is to  continue advair HFA 2 puffs twice daily. We will add LAMA therapy to her regimen and start her on spiriva respimat 2 puffs daily. She can use albuterol inhaler as needed.   Freda Jackson, MD [facility] Office: [phone]   Current Outpatient Medications:  .  acetaminophen (TYLENOL) 325 MG tablet, Take 650 mg by mouth every 6 (six) hours as needed., Disp: , Rfl:  .  albuterol (VENTOLIN HFA) 108 (90 Base) MCG/ACT inhaler, Inhale 2 puffs into the lungs every 6 (six) hours as needed for wheezing or shortness of breath., Disp: 8 g, Rfl: 6 .  amLODipine (NORVASC) 5 MG tablet, Take by mouth., Disp: , Rfl:  .  aspirin 81 MG EC tablet, Take by mouth., Disp: , Rfl:  .  atorvastatin (LIPITOR) 40 MG tablet, Take by mouth., Disp: , Rfl:  .  baclofen (LIORESAL) 10 MG tablet, Take by mouth., Disp: , Rfl:  .  Bepotastine Besilate 1.5 % SOLN, Apply to eye., Disp: , Rfl:  .  busPIRone (BUSPAR) 5 MG tablet, 1 3 times daily for anxiety, Disp: , Rfl:  .  conjugated estrogens (PREMARIN) vaginal cream, Place vaginally., Disp: , Rfl:  .  darifenacin (ENABLEX) 15 MG 24 hr tablet, Take 1 tablet by mouth daily., Disp: , Rfl:  .  fluticasone (FLONASE) 50 MCG/ACT nasal spray, Place 1 spray into both nostrils daily., Disp: 48 g, Rfl: 2 .  fluticasone-salmeterol (ADVAIR HFA) 230-21 MCG/ACT inhaler, Inhale 2 puffs into the lungs 2 (two) times daily., Disp: 1 each, Rfl: 6 .  levothyroxine (SYNTHROID) 88 MCG tablet, Take by mouth., Disp: , Rfl:  .  lisinopril (ZESTRIL) 5 MG tablet, Take 5 mg by mouth daily., Disp: , Rfl:  .  nystatin (MYCOSTATIN) 100000 UNIT/ML suspension, Take 5 mLs (500,000 Units total) by mouth 4 (four) times daily., Disp: 60 mL, Rfl: 0 .  Omega-3 Fatty Acids (FISH OIL) 1000 MG CAPS, Take 1 capsule by mouth daily., Disp: , Rfl:  .  omeprazole (PRILOSEC) 20 MG capsule, Take by mouth., Disp: , Rfl:  .  pregabalin (LYRICA) 75 MG capsule, Take by mouth., Disp: , Rfl:  .   propranolol (INDERAL) 10 MG tablet, Take by mouth., Disp: , Rfl:  .  simvastatin (ZOCOR) 20 MG tablet, Take by mouth., Disp: , Rfl:

## 2020-11-18 ENCOUNTER — Telehealth: Payer: Self-pay | Admitting: Pulmonary Disease

## 2020-11-18 MED ORDER — ADVAIR HFA 115-21 MCG/ACT IN AERO: 2.0000 | INHALATION_SPRAY | Freq: Two times a day (BID) | RESPIRATORY_TRACT | 12 refills | Status: DC

## 2020-11-18 NOTE — Telephone Encounter (Signed)
Pt returning missed call.//TM

## 2020-11-18 NOTE — Telephone Encounter (Signed)
It looks like she was on advair HFA 165mcg at the PCP visit prior to the office visit with me.   At the office visit with me on 10/10/20 it was listed as advair HFA 22mcg which continued on her subsequent visits with Aaron Edelman and myself.   My apologies for this error. Please change her advair HFA back to 173mcg 2 puffs twice daily.   In the mean time, she can use the Advair HFA 2103mcg 1 puff twice daily (instead of 2 puffs) until the correct inhaler (advair hfa 154mcg) reaches her.  Thanks, Wille Glaser

## 2020-11-18 NOTE — Telephone Encounter (Signed)
Pt states she has been on Advair HFA 115 since 2019.Pt recently received Advair 241mcg in the mail and does not know why.Pt states increase in medication was never discussed. Wyn Quaker ordered med. Wanting to know which to continue.Please advise [phone]

## 2020-11-18 NOTE — Telephone Encounter (Signed)
I have called and LM on VM for the pt to call back.  I only see in the chart that she has been on the advair 230 since she has been seen in our office.

## 2020-11-18 NOTE — Telephone Encounter (Signed)
I have called and spoke with the pt and she is aware of the change and recs from [place].  Nothing further is needed.

## 2020-11-18 NOTE — Telephone Encounter (Signed)
I have called the pt and she stated that she has only been taking the advair 115 HFA and when she got her last shipment she received the Advair HFA 230.  She stated that this was never discussed with her to increase the advair and she did not want to take this without an explanation of why the increase was made.  JD please advise. Thanks

## 2020-12-06 ENCOUNTER — Encounter: Payer: Self-pay | Admitting: Neurology

## 2021-03-07 ENCOUNTER — Ambulatory Visit (INDEPENDENT_AMBULATORY_CARE_PROVIDER_SITE_OTHER): Payer: Medicare Other | Admitting: Podiatry

## 2021-03-07 ENCOUNTER — Encounter: Payer: Self-pay | Admitting: Podiatry

## 2021-03-07 ENCOUNTER — Other Ambulatory Visit: Payer: Self-pay

## 2021-03-07 VITALS — BP 135/81 | HR 73 | Temp 97.4°F | Resp 14

## 2021-03-07 DIAGNOSIS — B351 Tinea unguium: Secondary | ICD-10-CM | POA: Diagnosis not present

## 2021-03-07 DIAGNOSIS — M79674 Pain in right toe(s): Secondary | ICD-10-CM | POA: Diagnosis not present

## 2021-03-07 DIAGNOSIS — L600 Ingrowing nail: Secondary | ICD-10-CM

## 2021-03-07 DIAGNOSIS — E7800 Pure hypercholesterolemia, unspecified: Secondary | ICD-10-CM | POA: Insufficient documentation

## 2021-03-07 DIAGNOSIS — M79675 Pain in left toe(s): Secondary | ICD-10-CM

## 2021-03-12 NOTE — Progress Notes (Signed)
Subjective:   Patient ID: Mariah Snyder, female   DOB: 75 y.o.   MRN: [id number]   HPI Patient presents to the office today for concerns for concerns of her toenails becoming thickened discolored and are curving and worse particularly over the right side.  She states that she previously went to another doctor earlier this year she had the corner of the nail removed with "acid" andand started to grow back causing discomfort on the ingrown portion.  Currently denies any redness or drainage or any swelling.  No recent treatment otherwise no other concerns.   Review of Systems  All other systems reviewed and are negative.  Past Medical History:  Diagnosis Date   Asthma     History reviewed. No pertinent surgical history.   Current Outpatient Medications:    amLODipine (NORVASC) 5 MG tablet, Take by mouth., Disp: , Rfl:    aspirin 81 MG EC tablet, Take 1 tablet by mouth daily., Disp: , Rfl:    clobetasol ointment (TEMOVATE) 0.05 %, Apply to affected area twice a week as directed, Disp: , Rfl:    diclofenac Sodium (VOLTAREN) 1 % GEL, Apply topically., Disp: , Rfl:    levothyroxine (SYNTHROID) 88 MCG tablet, Take by mouth., Disp: , Rfl:    lidocaine (LIDODERM) 5 %, Place onto the skin., Disp: , Rfl:    metroNIDAZOLE (METROGEL) 0.75 % gel, Apply topically., Disp: , Rfl:    omeprazole (PRILOSEC) 20 MG capsule, Take by mouth., Disp: , Rfl:    prednisoLONE acetate (PRED FORTE) 1 % ophthalmic suspension, Apply to eye., Disp: , Rfl:    pregabalin (LYRICA) 75 MG capsule, Take by mouth., Disp: , Rfl:    primidone (MYSOLINE) 50 MG tablet, Take by mouth., Disp: , Rfl:    simvastatin (ZOCOR) 20 MG tablet, Take 20 mg by mouth daily., Disp: , Rfl:    triamcinolone cream (KENALOG) 0.1 %, Apply 2 times a day to affected areas.  Stop when smooth., Disp: , Rfl:    acetaminophen (TYLENOL) 325 MG tablet, Take 650 mg by mouth every 6 (six) hours as needed., Disp: , Rfl:    acetaminophen (TYLENOL) 325 MG tablet,  Take by mouth., Disp: , Rfl:    albuterol (VENTOLIN HFA) 108 (90 Base) MCG/ACT inhaler, Inhale 2 puffs into the lungs every 6 (six) hours as needed for wheezing or shortness of breath., Disp: 8 g, Rfl: 6   baclofen (LIORESAL) 10 MG tablet, Take by mouth., Disp: , Rfl:    Bepotastine Besilate 1.5 % SOLN, Apply to eye., Disp: , Rfl:    busPIRone (BUSPAR) 5 MG tablet, 1 3 times daily for anxiety, Disp: , Rfl:    carboxymethylcellul-glycerin (REFRESH OPTIVE) 0.5-0.9 % ophthalmic solution, Apply to eye., Disp: , Rfl:    conjugated estrogens (PREMARIN) vaginal cream, Place vaginally., Disp: , Rfl:    fluticasone-salmeterol (ADVAIR HFA) 230-21 MCG/ACT inhaler, Inhale 2 puffs into the lungs 2 (two) times daily., Disp: 3 each, Rfl: 3   lisinopril (ZESTRIL) 5 MG tablet, Take 5 mg by mouth daily., Disp: , Rfl:    Omega-3 Fatty Acids (FISH OIL) 1000 MG CAPS, Take 1 capsule by mouth daily., Disp: , Rfl:    omeprazole (PRILOSEC OTC) 20 MG tablet, Take by mouth., Disp: , Rfl:    propranolol (INDERAL) 10 MG tablet, Take by mouth., Disp: , Rfl:    White Petrolatum-Mineral Oil ([place] PETROL-MINERAL OIL-LANOLIN) 0.1-0.1 % OINT, Apply to eye., Disp: , Rfl:   Allergies  Allergen Reactions  Sulfa Antibiotics Other (See Comments)    Causes headaches. Causes headaches. Causes headaches.    Grass Extracts [Gramineae Pollens] Itching and Other (See Comments)    Runny itchy eyes & nose. Rhinitis  Runny itchy eyes & nose. Rhinitis  Rhinitis  Runny itchy eyes & nose. Rhinitis  Runny itchy eyes & nose. Rhinitis   stuffy nose, itchy eyes, asthma    Molds & Smuts Itching and Other (See Comments)    Runny itchy eyes & nose. Runny itchy eyes & nose. Runny itchy eyes & nose. Runny itchy eyes & nose.           Objective:  Physical Exam  General: AAO x3, NAD  Dermatological: There is mild incurvation present to the hallux toenails.  Nails are mildly hypertrophic and dystrophic with yellow discoloration.   Pain at the ingrown portion the nail aspect.  There is no edema, erythema.  There is no open lesions.  Vascular: Dorsalis Pedis artery and Posterior Tibial artery pedal pulses are 2/4 bilateral with immedate capillary fill time.  There is no pain with calf compression, swelling, warmth, erythema.   Neruologic: Grossly intact via light touch bilateral.   Musculoskeletal: No gross boney pedal deformities bilateral.  Tenderness to the ingrowing portion of the nail but no other area discomfort.  Gait: Unassisted, Nonantalgic.       Assessment:   Ingrown toenail without infection, symptomatic onychomycosis     Plan:  We discussed partial nail avulsion with chemical matricectomy.  She had this done previously was not happy with the outcome does not want to have this performed again today.  I did debride the nail plate and complications of bleeding.  She wants to get her regular scheduled to have this done as she cannot do them herself after starting it thickened of color causing discomfort.  Also discussed treatment options topically for nail fungus.  Trula Slade DPM

## 2021-03-13 ENCOUNTER — Encounter: Payer: Self-pay | Admitting: Pulmonary Disease

## 2021-03-13 ENCOUNTER — Ambulatory Visit (INDEPENDENT_AMBULATORY_CARE_PROVIDER_SITE_OTHER): Payer: Medicare Other | Admitting: Pulmonary Disease

## 2021-03-13 ENCOUNTER — Other Ambulatory Visit: Payer: Self-pay

## 2021-03-13 VITALS — BP 132/70 | HR 75 | Temp 97.9°F | Resp 18 | Ht 64.0 in | Wt 158.8 lb

## 2021-03-13 DIAGNOSIS — J4540 Moderate persistent asthma, uncomplicated: Secondary | ICD-10-CM

## 2021-03-13 LAB — PULMONARY FUNCTION TEST
DL/VA % pred: 101 %
DL/VA: 4.2 ml/min/mmHg/L
DLCO cor % pred: 78 %
DLCO cor: 14.41 ml/min/mmHg
DLCO unc % pred: 78 %
DLCO unc: 14.41 ml/min/mmHg
FEF 25-75 Post: 1.81 L/sec
FEF 25-75 Pre: 1.68 L/sec
FEF2575-%Change-Post: 8 %
FEF2575-%Pred-Post: 116 %
FEF2575-%Pred-Pre: 107 %
FEV1-%Change-Post: 1 %
FEV1-%Pred-Post: 82 %
FEV1-%Pred-Pre: 81 %
FEV1-Post: 1.64 L
FEV1-Pre: 1.61 L
FEV1FVC-%Change-Post: 1 %
FEV1FVC-%Pred-Pre: 110 %
FEV6-%Change-Post: 0 %
FEV6-%Pred-Post: 78 %
FEV6-%Pred-Pre: 78 %
FEV6-Post: 1.96 L
FEV6-Pre: 1.96 L
FEV6FVC-%Pred-Post: 105 %
FEV6FVC-%Pred-Pre: 105 %
FVC-%Change-Post: 0 %
FVC-%Pred-Post: 74 %
FVC-%Pred-Pre: 74 %
FVC-Post: 1.96 L
FVC-Pre: 1.96 L
Post FEV1/FVC ratio: 83 %
Post FEV6/FVC ratio: 100 %
Pre FEV1/FVC ratio: 82 %
Pre FEV6/FVC Ratio: 100 %
RV % pred: 81 %
RV: 1.84 L
TLC % pred: 78 %
TLC: 3.84 L

## 2021-03-13 MED ORDER — ADVAIR HFA 230-21 MCG/ACT IN AERO: 2.0000 | INHALATION_SPRAY | Freq: Two times a day (BID) | RESPIRATORY_TRACT | 3 refills | Status: DC

## 2021-03-13 NOTE — Progress Notes (Signed)
PFT done today. 

## 2021-03-13 NOTE — Progress Notes (Signed)
Synopsis: Referred in January 2022 for shortness of breath  Subjective:   PATIENT ID: Mariah Snyder GENDER: female DOB: 31-Dec-1945, MRN: [id number]   HPI  Chief Complaint  Patient presents with   Follow-up    Have difficulty breathing with activity.   Mariah Snyder is a 75 year old woman, former smoker with asthma and GERD who returns to pulmonary clinic for asthma follow up.   She has had an increase in shortness of breath since last visit with activity.  She stopped using Fluticasone nasal spray as this gave her nausea which led to vomiting.  She is now using simple saline nasal spray.  She denies any further sinus drainage issues.  OV 10/2020: She had a dentist appointment who reported she had concerning findings for thrush with white patches and redness at the right posterior area of her throat. There was concern that this was related to the spiriva inhaler she was recently started on. She had an appointment with Wyn Quaker, NP on 11/01/20 for evaluation of potential medication reaction.   Prior to her dentist raising concern for thrush, she had no issues of sore throat or oral irritation. She has been on advair for many years and consistently rinses her mouth out and has not previously had issues with thrush.   The spiriva inhaler was stopped and she was treated with 2-3 days of nystatin.   She has continued on the advair and denies any issues with her breathing currently. She is not experiencing the wheezing she previously complained of.   She complains of cough and post-nasal sinus drainage. The cough is worse in the mornings with a whitish sputum.   OV 10/10/20 She reports having progressive shortness of breath and wheezing since September. She notices these symptoms when she walks. She reports being diagnosed with asthma in 2014 when she initially developed the shortness of breath and wheezing. She has been on advair HFA since then with adquate control of her symptoms.   She tested  positive for covid on 10/03/20 which led to fatigue and worsening of her shortness of breath. She also reports sinus congestion and post nasal drip since covid.   Her asthma triggers include seasonal allergies worse in the fall and spring. Strong perfumes, colonges and cigarette smoke also make her breathing worse. She has not required prednisone or antibiotics in the past year.   She has positional sleep apnea based on previous sleep testing and trys to avoid sleeping on her back.   She was exposed to second hand smoke growing up as her father smoked in the home. She also smoked socially in the past having cigarettes when drinking.   Her mother and youngest brother have asthma.   Past Medical History:  Diagnosis Date   Asthma      Family History  Problem Relation Age of Onset   Asthma Mother      Social History   Socioeconomic History   Marital status: Widowed    Spouse name: Not on file   Number of children: Not on file   Years of education: Not on file   Highest education level: Not on file  Occupational History   Not on file  Tobacco Use   Smoking status: Former    Pack years: 0.00    Types: Cigarettes   Smokeless tobacco: Never  Substance and Sexual Activity   Alcohol use: Not on file   Drug use: Not on file   Sexual activity: Not on file  Other Topics Concern   Not on file  Social History Narrative   Not on file   Social Determinants of Health   Financial Resource Strain: Not on file  Food Insecurity: Not on file  Transportation Needs: Not on file  Physical Activity: Not on file  Stress: Not on file  Social Connections: Not on file  Intimate Partner Violence: Not on file     Allergies  Allergen Reactions   Sulfa Antibiotics Other (See Comments)    Causes headaches. Causes headaches. Causes headaches.    Grass Extracts [Gramineae Pollens] Itching and Other (See Comments)    Runny itchy eyes & nose. Rhinitis  Runny itchy eyes & nose. Rhinitis   Rhinitis  Runny itchy eyes & nose. Rhinitis  Runny itchy eyes & nose. Rhinitis   stuffy nose, itchy eyes, asthma    Molds & Smuts Itching and Other (See Comments)    Runny itchy eyes & nose. Runny itchy eyes & nose. Runny itchy eyes & nose. Runny itchy eyes & nose.      Outpatient Medications Prior to Visit  Medication Sig Dispense Refill   acetaminophen (TYLENOL) 325 MG tablet Take 650 mg by mouth every 6 (six) hours as needed.     acetaminophen (TYLENOL) 325 MG tablet Take by mouth.     albuterol (VENTOLIN HFA) 108 (90 Base) MCG/ACT inhaler Inhale 2 puffs into the lungs every 6 (six) hours as needed for wheezing or shortness of breath. 8 g 6   amLODipine (NORVASC) 5 MG tablet Take by mouth.     aspirin 81 MG EC tablet Take 1 tablet by mouth daily.     baclofen (LIORESAL) 10 MG tablet Take by mouth.     Bepotastine Besilate 1.5 % SOLN Apply to eye.     busPIRone (BUSPAR) 5 MG tablet 1 3 times daily for anxiety     carboxymethylcellul-glycerin (REFRESH OPTIVE) 0.5-0.9 % ophthalmic solution Apply to eye.     clobetasol ointment (TEMOVATE) 0.05 % Apply to affected area twice a week as directed     conjugated estrogens (PREMARIN) vaginal cream Place vaginally.     diclofenac Sodium (VOLTAREN) 1 % GEL Apply topically.     levothyroxine (SYNTHROID) 88 MCG tablet Take by mouth.     lidocaine (LIDODERM) 5 % Place onto the skin.     lisinopril (ZESTRIL) 5 MG tablet Take 5 mg by mouth daily.     metroNIDAZOLE (METROGEL) 0.75 % gel Apply topically.     Omega-3 Fatty Acids (FISH OIL) 1000 MG CAPS Take 1 capsule by mouth daily.     omeprazole (PRILOSEC OTC) 20 MG tablet Take by mouth.     omeprazole (PRILOSEC) 20 MG capsule Take by mouth.     prednisoLONE acetate (PRED FORTE) 1 % ophthalmic suspension Apply to eye.     pregabalin (LYRICA) 75 MG capsule Take by mouth.     primidone (MYSOLINE) 50 MG tablet Take by mouth.     simvastatin (ZOCOR) 20 MG tablet Take 20 mg by mouth daily.      triamcinolone cream (KENALOG) 0.1 % Apply 2 times a day to affected areas.  Stop when smooth.     White Petrolatum-Mineral Oil ([place] PETROL-MINERAL OIL-LANOLIN) 0.1-0.1 % OINT Apply to eye.     amLODipine (NORVASC) 5 MG tablet Take by mouth.     aspirin 81 MG EC tablet Take by mouth.     fluticasone-salmeterol (ADVAIR HFA) 115-21 MCG/ACT inhaler Inhale 2 puffs into the lungs 2 (  two) times daily. 1 each 12   levothyroxine (SYNTHROID) 88 MCG tablet Take by mouth.     lisinopril (ZESTRIL) 5 MG tablet Take by mouth.     omeprazole (PRILOSEC) 20 MG capsule Take by mouth.     pregabalin (LYRICA) 75 MG capsule Take by mouth.     propranolol (INDERAL) 10 MG tablet Take by mouth.     fluorometholone (FML) 0.1 % ophthalmic suspension Apply to eye. (Patient not taking: Reported on 03/13/2021)     ketotifen (ZADITOR) 0.025 % ophthalmic solution Apply to eye. (Patient not taking: Reported on 03/13/2021)     No facility-administered medications prior to visit.    Review of Systems  Constitutional:  Negative for chills, fever, malaise/fatigue and weight loss.  HENT:  Negative for congestion, sinus pain and sore throat.   Eyes: Negative.   Respiratory:  Positive for shortness of breath. Negative for cough, hemoptysis, sputum production and wheezing.   Cardiovascular:  Negative for chest pain, palpitations, orthopnea, claudication and leg swelling.  Gastrointestinal:  Negative for abdominal pain, heartburn, nausea and vomiting.  Genitourinary: Negative.   Musculoskeletal:  Negative for joint pain and myalgias.  Skin:  Negative for rash.  Neurological:  Negative for weakness.  Endo/Heme/Allergies: Negative.   Psychiatric/Behavioral: Negative.       Objective:   Vitals:   03/13/21 0931  BP: 132/70  Pulse: 75  Resp: 18  Temp: 97.9 F (36.6 C)  TempSrc: Oral  SpO2: 96%  Weight: 158 lb 12.8 oz (72 kg)  Height: 5\' 4"  (1.626 m)     Physical Exam Constitutional:      Appearance: Normal  appearance. She is normal weight.  HENT:     Head: Normocephalic and atraumatic.     Nose: Nose normal.     Mouth/Throat:     Mouth: Mucous membranes are moist.  Eyes:     General: No scleral icterus.    Conjunctiva/sclera: Conjunctivae normal.  Cardiovascular:     Rate and Rhythm: Normal rate and regular rhythm.     Pulses: Normal pulses.     Heart sounds: Normal heart sounds.  Pulmonary:     Effort: Pulmonary effort is normal.     Breath sounds: Normal breath sounds.  Abdominal:     General: Bowel sounds are normal.     Palpations: Abdomen is soft.  Musculoskeletal:     Right lower leg: No edema.     Left lower leg: No edema.  Skin:    General: Skin is warm and dry.  Neurological:     General: No focal deficit present.  Psychiatric:        Mood and Affect: Mood normal.        Behavior: Behavior normal.        Thought Content: Thought content normal.        Judgment: Judgment normal.    CBC No results found for: WBC, RBC, HGB, HCT, PLT, MCV, MCH, MCHC, RDW, LYMPHSABS, MONOABS, EOSABS, BASOSABS   Chest imaging: CXR 10/03/2017 Radiographically clear lungs.  No pleural effusion or pneumothorax.  Cardiomediastinal silhouette unremarkable.   PFT: PFT Results Latest Ref Rng & Units 03/13/2021  FVC-Pre L 1.96  FVC-Predicted Pre % 74  FVC-Post L 1.96  FVC-Predicted Post % 74  Pre FEV1/FVC % % 82  Post FEV1/FCV % % 83  FEV1-Pre L 1.61  FEV1-Predicted Pre % 81  FEV1-Post L 1.64  DLCO uncorrected ml/min/mmHg 14.41  DLCO UNC% % 78  DLCO corrected ml/min/mmHg 14.Mariah Snyder  DLCO COR %Predicted % 78  DLVA Predicted % 101  TLC L 3.84  TLC % Predicted % 78  RV % Predicted % 81  PFT 2022: Mild restrictive defect    Assessment & Plan:   Moderate persistent asthma without complication - Plan: fluticasone-salmeterol (ADVAIR HFA) 230-21 MCG/ACT inhaler, Pulmonary function test  Discussion: Mariah Snyder is a 75 year old woman, former smoker with asthma and GERD who returns to  pulmonary clinic for asthma follow up.  PFTs today show mild restrictive defect.  She reports an increase in exertional shortness of breath since last visit.  We will increase her Advair dosing to 230-25 MCG 2 puffs twice daily.   If she continues to have progressive shortness of breath despite the change in her inhaler therapy we will repeat chest imaging with HRCT chest.  Follow up in 4 months.   Freda Jackson, MD [facility] Office: [phone]   Current Outpatient Medications:    acetaminophen (TYLENOL) 325 MG tablet, Take 650 mg by mouth every 6 (six) hours as needed., Disp: , Rfl:    acetaminophen (TYLENOL) 325 MG tablet, Take by mouth., Disp: , Rfl:    albuterol (VENTOLIN HFA) 108 (90 Base) MCG/ACT inhaler, Inhale 2 puffs into the lungs every 6 (six) hours as needed for wheezing or shortness of breath., Disp: 8 g, Rfl: 6   amLODipine (NORVASC) 5 MG tablet, Take by mouth., Disp: , Rfl:    aspirin 81 MG EC tablet, Take 1 tablet by mouth daily., Disp: , Rfl:    baclofen (LIORESAL) 10 MG tablet, Take by mouth., Disp: , Rfl:    Bepotastine Besilate 1.5 % SOLN, Apply to eye., Disp: , Rfl:    busPIRone (BUSPAR) 5 MG tablet, 1 3 times daily for anxiety, Disp: , Rfl:    carboxymethylcellul-glycerin (REFRESH OPTIVE) 0.5-0.9 % ophthalmic solution, Apply to eye., Disp: , Rfl:    clobetasol ointment (TEMOVATE) 0.05 %, Apply to affected area twice a week as directed, Disp: , Rfl:    conjugated estrogens (PREMARIN) vaginal cream, Place vaginally., Disp: , Rfl:    diclofenac Sodium (VOLTAREN) 1 % GEL, Apply topically., Disp: , Rfl:    fluticasone-salmeterol (ADVAIR HFA) 230-21 MCG/ACT inhaler, Inhale 2 puffs into the lungs 2 (two) times daily., Disp: 3 each, Rfl: 3   levothyroxine (SYNTHROID) 88 MCG tablet, Take by mouth., Disp: , Rfl:    lidocaine (LIDODERM) 5 %, Place onto the skin., Disp: , Rfl:    lisinopril (ZESTRIL) 5 MG tablet, Take 5 mg by mouth daily., Disp: ,  Rfl:    metroNIDAZOLE (METROGEL) 0.75 % gel, Apply topically., Disp: , Rfl:    Omega-3 Fatty Acids (FISH OIL) 1000 MG CAPS, Take 1 capsule by mouth daily., Disp: , Rfl:    omeprazole (PRILOSEC OTC) 20 MG tablet, Take by mouth., Disp: , Rfl:    omeprazole (PRILOSEC) 20 MG capsule, Take by mouth., Disp: , Rfl:    prednisoLONE acetate (PRED FORTE) 1 % ophthalmic suspension, Apply to eye., Disp: , Rfl:    pregabalin (LYRICA) 75 MG capsule, Take by mouth., Disp: , Rfl:    primidone (MYSOLINE) 50 MG tablet, Take by mouth., Disp: , Rfl:    simvastatin (ZOCOR) 20 MG tablet, Take 20 mg by mouth daily., Disp: , Rfl:    triamcinolone cream (KENALOG) 0.1 %, Apply 2 times a day to affected areas.  Stop when smooth., Disp: , Rfl:    White Petrolatum-Mineral Oil ([place] PETROL-MINERAL OIL-LANOLIN) 0.1-0.1 %  OINT, Apply to eye., Disp: , Rfl:    propranolol (INDERAL) 10 MG tablet, Take by mouth., Disp: , Rfl:

## 2021-03-13 NOTE — Patient Instructions (Signed)
Start advair 230-74mcg 2 puffs twice daily.   Stop using advair 115-62mcg inhaler  Use albuterol inhaler 1-2 puffs every 4-6 hours as needed for shortness of breath  We will schedule you for pulmonary function tests at your convenience.

## 2021-04-06 NOTE — Progress Notes (Signed)
Assessment/Plan:   1.  Essential Tremor.  -This is evidenced by the symmetrical nature and longstanding hx of gradually getting worse.  We discussed nature and pathophysiology.  We discussed that this can continue to gradually get worse with time.  She is pleased with primidone 50 mg, 3 po bid.  She does c/o EDS with it and told her she could try primidone 50 mg, 2 in the AM, 4 in the PM to see if that was any better.  She generally takes a nap at 3 PM and finds it restorative without interrupting her nighttime sleep.  -She does have some head tremor, possibly with a dystonic component.  We did talk about the value of Botox along with its risks and benefits.  She can think about that and decide if she would like to proceed in the future.  2.  Possible TIA, March, 2022  -Event was very short-lived (seconds) so it is questionable whether this really represented TIA.  Nonetheless, Dr. Philippa Chester at [facility] worked this up extensively.  MRI and MRA were negative for anything acute.  Zio patch was completed and was unremarkable.  Patient now on aspirin, 81 mg daily.   Subjective:   Mariah Snyder was seen in consultation in the movement disorder clinic at the request of Cheral Bay, MD.  The evaluation is for tremor.  Patient previously cared for by Dr. Philippa Chester and those notes are reviewed.  She has also seen [place] neurolog[facility].  Patient with long history of tremor, dating back at least since 2005 (pt states that it was earlier than that).  Tremor has affected her hands bilaterally, her head and her voice.    There is a family hx of tremor in her son.  Her mother also has a history of blepharospasm.  She was last seen by Dr. Philippa Chester on November 30, 2020.  At that point in time, she was told that she could increase her primidone to 150 mg twice per day if needed (was previously on 100 mg in the morning and 150 at night).  She did that but it makes her sleepy and about 3pm, she has to take an hour sleep.  It doesn't interfere  with her nighttime sleep.  Patient is also on propranolol, 10 mg twice per day.  At the visit in March, 2022 with Dr. Philippa Chester, patient mentioned episode of visual loss.  Patient was watching TV and she was unable to read the TV screen and felt that the TV was tilting.  It resolved within seconds.  She had been off of her aspirin for more than a year.  Dr. Philippa Chester ordered MRI, MRA, Zio patch and restarted her aspirin.  MR I and MRA were completed on December 12, 2020.  MRA was negative.  MRI brain was compared to April, 2018 MRI brain and demonstrated no new lesions, and similar small vessel findings.  Zio patch done in July, 2022 was essentially unremarkable.  Affected by caffeine:  No. (1 coffee and maybe a latte in afternoon) Affected by alcohol:  in past it seemed to but not now (1 drink/week) Affected by stress:  Yes.   Affected by fatigue:  Yes.   Spills soup if on spoon:  No. Spills glass of liquid if full:  No. Affects ADL's (tying shoes, brushing teeth, etc):  No.  Current/Previously tried tremor medications: Primidone; propranolol; Lyrica (took for fibromyalgia)   Outside reports reviewed: historical medical records and referral letter/letters.  Allergies  Allergen Reactions   Sulfa  Antibiotics Other (See Comments)    Causes headaches. Causes headaches. Causes headaches.    Grass Extracts [Gramineae Pollens] Itching and Other (See Comments)    Runny itchy eyes & nose. Rhinitis  Runny itchy eyes & nose. Rhinitis  Rhinitis  Runny itchy eyes & nose. Rhinitis  Runny itchy eyes & nose. Rhinitis   stuffy nose, itchy eyes, asthma    Molds & Smuts Itching and Other (See Comments)    Runny itchy eyes & nose. Runny itchy eyes & nose. Runny itchy eyes & nose. Runny itchy eyes & nose.     Current Outpatient Medications  Medication Instructions   acetaminophen (TYLENOL) 325 MG tablet Oral   acetaminophen (TYLENOL) 650 mg, Oral, Every 6 hours PRN   albuterol (VENTOLIN HFA) 108 (90  Base) MCG/ACT inhaler 2 puffs, Inhalation, Every 6 hours PRN   amLODipine (NORVASC) 5 MG tablet Oral   aspirin 81 MG EC tablet 1 tablet, Oral, Daily   baclofen (LIORESAL) 10 MG tablet Oral   Bepotastine Besilate 1.5 % SOLN Ophthalmic   busPIRone (BUSPAR) 5 MG tablet 1 3 times daily for anxiety   carboxymethylcellul-glycerin (REFRESH OPTIVE) 0.5-0.9 % ophthalmic solution Ophthalmic   clobetasol ointment (TEMOVATE) 0.05 % Apply to affected area twice a week as directed   conjugated estrogens (PREMARIN) vaginal cream Vaginal   diclofenac Sodium (VOLTAREN) 1 % GEL Topical   fluticasone-salmeterol (ADVAIR HFA) 230-21 MCG/ACT inhaler 2 puffs, Inhalation, 2 times daily   levothyroxine (SYNTHROID) 88 MCG tablet Oral   lidocaine (LIDODERM) 5 % Transdermal   lisinopril (ZESTRIL) 5 mg, Oral, Daily   metroNIDAZOLE (METROGEL) 0.75 % gel Topical   Omega-3 Fatty Acids (FISH OIL) 1000 MG CAPS 1 capsule, Oral, Daily   omeprazole (PRILOSEC OTC) 20 MG tablet Oral   omeprazole (PRILOSEC) 20 MG capsule Oral   prednisoLONE acetate (PRED FORTE) 1 % ophthalmic suspension Ophthalmic   pregabalin (LYRICA) 75 MG capsule Oral   primidone (MYSOLINE) 50 MG tablet Oral   propranolol (INDERAL) 10 MG tablet Oral   simvastatin (ZOCOR) 20 mg, Oral, Daily   triamcinolone cream (KENALOG) 0.1 % Apply 2 times a day to affected areas.  Stop when smooth.   White Petrolatum-Mineral Oil ([place] PETROL-MINERAL OIL-LANOLIN) 0.1-0.1 % OINT Ophthalmic     Objective:   VITALS:   Vitals:   04/10/21 1011  BP: 112/72  Pulse: 73  SpO2: 93%  Weight: 157 lb (71.2 kg)  Height: 5\' 4"  (1.626 m)   Gen:  Appears stated age and in NAD. HEENT:  Normocephalic, atraumatic. The mucous membranes are moist. The superficial temporal arteries are without ropiness or tenderness. Cardiovascular: Regular rate and rhythm. Lungs: Clear to auscultation bilaterally. Neck: There are no carotid bruits noted bilaterally.  Head slightly to the left with  complex head titubation  NEUROLOGICAL:  Orientation:  The patient is alert and oriented x 3.   Cranial nerves: There is good facial symmetry. Extraocular muscles are intact and visual fields are full to confrontational testing. Speech is fluent and clear. Soft palate rises symmetrically and there is no tongue deviation. Hearing is intact to conversational tone. Tone: Tone is good throughout. Sensation: Sensation is intact to light touch touch throughout (facial, trunk, extremities). Vibration is intact at the bilateral big toe. There is no extinction with double simultaneous stimulation. There is no sensory dermatomal level identified. Coordination:  The patient has no dysdiadichokinesia or dysmetria. Motor: Strength is 5/5 in the bilateral upper and lower extremities.  Shoulder shrug is equal bilaterally.  There is no pronator drift.  There are no fasciculations noted. DTR's: Deep tendon reflexes are 1/4 at the bilateral biceps, triceps, brachioradialis, patella and achilles.  Plantar responses are downgoing bilaterally. Gait and Station: The patient is able to ambulate without difficulty. She is wide based with some decreased arm swing, R>L  MOVEMENT EXAM: Tremor:  There is tremor in the UE, noted most significantly with action/intention.  No rest tremor.  The patient is able to draw Archimedes spirals without significant difficulty.  The patient is  able to pour water from one glass to another without spilling it.      Total time spent on today's visit was 50 minutes, including both face-to-face time and nonface-to-face time.  Time included that spent on review of records (prior notes available to me/labs/imaging if pertinent), discussing treatment and goals, answering patient's questions and coordinating care.  CC:  Esmond Harps, MD

## 2021-04-10 ENCOUNTER — Ambulatory Visit (INDEPENDENT_AMBULATORY_CARE_PROVIDER_SITE_OTHER): Payer: Medicare Other | Admitting: Neurology

## 2021-04-10 ENCOUNTER — Encounter: Payer: Self-pay | Admitting: Neurology

## 2021-04-10 ENCOUNTER — Other Ambulatory Visit: Payer: Self-pay

## 2021-04-10 VITALS — BP 112/72 | HR 73 | Ht 64.0 in | Wt 157.0 lb

## 2021-04-10 DIAGNOSIS — G250 Essential tremor: Secondary | ICD-10-CM | POA: Diagnosis not present

## 2021-04-10 MED ORDER — PROPRANOLOL HCL 10 MG PO TABS: 10.0000 mg | ORAL_TABLET | Freq: Two times a day (BID) | ORAL | 1 refills | Status: DC

## 2021-04-10 MED ORDER — PRIMIDONE 50 MG PO TABS: 150.0000 mg | ORAL_TABLET | Freq: Two times a day (BID) | ORAL | 1 refills | Status: DC

## 2021-04-10 NOTE — Patient Instructions (Signed)
Essential Tremor A tremor is trembling or shaking that a person cannot control. Most tremors affect the hands or arms. Tremors can also affect the head, vocal cords, legs, and other parts of the body. Essential tremor is a tremor without a known cause. Usually, it occurs while a person is trying to perform an action. Ittends to get worse gradually as a person ages. What are the causes? The cause of this condition is not known. What increases the risk? You are more likely to develop this condition if: You have a family member with essential tremor. You are age 40 or older. You take certain medicines. What are the signs or symptoms? The main sign of a tremor is a rhythmic shaking of certain parts of your body that is uncontrolled and unintentional. You may: Have difficulty eating with a spoon or fork. Have difficulty writing. Nod your head up and down or side to side. Have a quivering voice. The shaking may: Get worse over time. Come and go. Be more noticeable on one side of your body. Get worse due to stress, fatigue, caffeine, and extreme heat or cold. How is this diagnosed? This condition may be diagnosed based on: Your symptoms and medical history. A physical exam. There is no single test to diagnose an essential tremor. However, your health care provider may order tests to rule out other causes of your condition. These may include: Blood and urine tests. Imaging studies of your brain, such as CT scan and MRI. A test that measures involuntary muscle movement (electromyogram). How is this treated? Treatment for essential tremor depends on the severity of the condition. Some tremors may go away without treatment. Mild tremors may not need treatment if they do not affect your day-to-day life. Severe tremors may need to be treated using one or more of the following options: Medicines. Lifestyle changes. Occupational or physical therapy. Follow these instructions at  home: Lifestyle  Do not use any products that contain nicotine or tobacco, such as cigarettes and e-cigarettes. If you need help quitting, ask your health care provider. Limit your caffeine intake as told by your health care provider. Try to get 8 hours of sleep each night. Find ways to manage your stress that fits your lifestyle and personality. Consider trying meditation or yoga. Try to anticipate stressful situations and allow extra time to manage them. If you are struggling emotionally with the effects of your tremor, consider working with a mental health provider.  General instructions Take over-the-counter and prescription medicines only as told by your health care provider. Avoid extreme heat and extreme cold. Keep all follow-up visits as told by your health care provider. This is important. Visits may include physical therapy visits. Contact a health care provider if: You experience any changes in the location or intensity of your tremors. You start having a tremor after starting a new medicine. You have tremor with other symptoms, such as: Numbness. Tingling. Pain. Weakness. Your tremor gets worse. Your tremor interferes with your daily life. You feel down, blue, or sad for at least 2 weeks in a row. Worrying about your tremor and what other people think about you interferes with your everyday life functions, including relationships, work, or school. Summary Essential tremor is a tremor without a known cause. Usually, it occurs when you are trying to perform an action. You are more likely to develop this condition if you have a family member with essential tremor. The main sign of a tremor is a rhythmic shaking of   certain parts of your body that is uncontrolled and unintentional. Treatment for essential tremor depends on the severity of the condition. This information is not intended to replace advice given to you by your health care provider. Make sure you discuss any  questions you have with your healthcare provider. Document Revised: 05/27/2020 Document Reviewed: 05/27/2020 Elsevier Patient Education  2022 Elsevier Inc.  

## 2021-04-28 ENCOUNTER — Other Ambulatory Visit: Payer: Self-pay

## 2021-04-28 ENCOUNTER — Ambulatory Visit (INDEPENDENT_AMBULATORY_CARE_PROVIDER_SITE_OTHER): Payer: Medicare Other | Admitting: Internal Medicine

## 2021-04-28 ENCOUNTER — Encounter: Payer: Self-pay | Admitting: Internal Medicine

## 2021-04-28 VITALS — BP 122/80 | HR 74 | Temp 97.9°F | Resp 18 | Ht 64.0 in | Wt 158.6 lb

## 2021-04-28 DIAGNOSIS — M545 Low back pain, unspecified: Secondary | ICD-10-CM | POA: Diagnosis not present

## 2021-04-28 DIAGNOSIS — L900 Lichen sclerosus et atrophicus: Secondary | ICD-10-CM

## 2021-04-28 DIAGNOSIS — Z634 Disappearance and death of family member: Secondary | ICD-10-CM

## 2021-04-28 DIAGNOSIS — H9193 Unspecified hearing loss, bilateral: Secondary | ICD-10-CM

## 2021-04-28 DIAGNOSIS — G250 Essential tremor: Secondary | ICD-10-CM

## 2021-04-28 DIAGNOSIS — I10 Essential (primary) hypertension: Secondary | ICD-10-CM

## 2021-04-28 DIAGNOSIS — E785 Hyperlipidemia, unspecified: Secondary | ICD-10-CM | POA: Diagnosis not present

## 2021-04-28 DIAGNOSIS — K2100 Gastro-esophageal reflux disease with esophagitis, without bleeding: Secondary | ICD-10-CM

## 2021-04-28 DIAGNOSIS — E063 Autoimmune thyroiditis: Secondary | ICD-10-CM

## 2021-04-28 DIAGNOSIS — N3281 Overactive bladder: Secondary | ICD-10-CM

## 2021-04-28 DIAGNOSIS — H9190 Unspecified hearing loss, unspecified ear: Secondary | ICD-10-CM | POA: Diagnosis not present

## 2021-04-28 DIAGNOSIS — J4540 Moderate persistent asthma, uncomplicated: Secondary | ICD-10-CM

## 2021-04-28 DIAGNOSIS — E7800 Pure hypercholesterolemia, unspecified: Secondary | ICD-10-CM

## 2021-04-28 DIAGNOSIS — G8929 Other chronic pain: Secondary | ICD-10-CM

## 2021-04-28 LAB — COMPREHENSIVE METABOLIC PANEL
ALT: 16 U/L (ref 0–35)
AST: 18 U/L (ref 0–37)
Albumin: 4 g/dL (ref 3.5–5.2)
Alkaline Phosphatase: 76 U/L (ref 39–117)
BUN: 18 mg/dL (ref 6–23)
CO2: 27 mEq/L (ref 19–32)
Calcium: 9.2 mg/dL (ref 8.4–10.5)
Chloride: 107 mEq/L (ref 96–112)
Creatinine, Ser: 0.71 mg/dL (ref 0.40–1.20)
GFR: 82.75 mL/min (ref >60.00)
Glucose, Bld: 87 mg/dL (ref 70–99)
Potassium: 4 mEq/L (ref 3.5–5.1)
Sodium: 141 mEq/L (ref 135–145)
Total Bilirubin: 0.2 mg/dL (ref 0.2–1.2)
Total Protein: 7.2 g/dL (ref 6.0–8.3)

## 2021-04-28 LAB — LIPID PANEL
Cholesterol: 167 mg/dL (ref 0–200)
HDL: 53 mg/dL (ref >39.00)
LDL Cholesterol: 75 mg/dL (ref 0–99)
NonHDL: 114.22
Total CHOL/HDL Ratio: 3
Triglycerides: 197 mg/dL — ABNORMAL HIGH (ref 0.0–149.0)
VLDL: 39.4 mg/dL (ref 0.0–40.0)

## 2021-04-28 LAB — CBC
HCT: 40.5 % (ref 36.0–46.0)
Hemoglobin: 13.4 g/dL (ref 12.0–15.0)
MCHC: 33 g/dL (ref 30.0–36.0)
MCV: 88 fl (ref 78.0–100.0)
Platelets: 244 10*3/uL (ref 150.0–400.0)
RBC: 4.61 Mil/uL (ref 3.87–5.11)
RDW: 14 % (ref 11.5–15.5)
WBC: 7.3 10*3/uL (ref 4.0–10.5)

## 2021-04-28 LAB — T4, FREE: Free T4: 0.89 ng/dL (ref 0.60–1.60)

## 2021-04-28 LAB — TSH: TSH: 14.22 u[IU]/mL — ABNORMAL HIGH (ref 0.35–5.50)

## 2021-04-28 MED ORDER — BUSPIRONE HCL 5 MG PO TABS: 5.0000 mg | ORAL_TABLET | Freq: Two times a day (BID) | ORAL | 3 refills | Status: DC

## 2021-04-28 MED ORDER — ESTROGENS, CONJUGATED 0.625 MG/GM VA CREA: TOPICAL_CREAM | VAGINAL | 3 refills | Status: DC

## 2021-04-28 MED ORDER — AMLODIPINE BESYLATE 5 MG PO TABS: 5.0000 mg | ORAL_TABLET | Freq: Every day | ORAL | 3 refills | Status: DC

## 2021-04-28 MED ORDER — SIMVASTATIN 20 MG PO TABS: 20.0000 mg | ORAL_TABLET | Freq: Every day | ORAL | 3 refills | Status: DC

## 2021-04-28 MED ORDER — METRONIDAZOLE 0.75 % EX GEL: Freq: Two times a day (BID) | CUTANEOUS | 3 refills | Status: DC

## 2021-04-28 MED ORDER — LISINOPRIL 5 MG PO TABS: 5.0000 mg | ORAL_TABLET | Freq: Every day | ORAL | 3 refills | Status: DC

## 2021-04-28 MED ORDER — CLOBETASOL PROPIONATE 0.05 % EX OINT: TOPICAL_OINTMENT | CUTANEOUS | 3 refills | Status: DC

## 2021-04-28 NOTE — Assessment & Plan Note (Signed)
Refilled clobetasol and estrogen ointments to use topically. Stable overall.

## 2021-04-28 NOTE — Addendum Note (Signed)
Addended by: Pricilla Holm A on: 04/28/2021 12:46 PM   Modules accepted: Level of Service

## 2021-04-28 NOTE — Progress Notes (Signed)
   Subjective:   Patient ID: Mariah Snyder, female    DOB: [birth year], 75 y.o.   MRN: [id number]  HPI The patient is a 75 YO female coming in new for ongoing medical concerns and specific concerns about thyroid (recent TSH 12, free T4 1, having fatigue and muscle cramps and other symptoms that it may be too low dosing).   PMH, [facility], social history reviewed and updated  Review of Systems  Constitutional:  Positive for fatigue.  HENT:  Positive for hearing loss.   Eyes: Negative.   Respiratory:  Negative for cough, chest tightness and shortness of breath.   Cardiovascular:  Negative for chest pain, palpitations and leg swelling.  Gastrointestinal:  Negative for abdominal distention, abdominal pain, constipation, diarrhea, nausea and vomiting.  Genitourinary:        Incontinence rare  Musculoskeletal: Negative.   Skin: Negative.   Neurological:  Positive for tremors.  Psychiatric/Behavioral: Negative.     Objective:  Physical Exam Constitutional:      Appearance: She is well-developed.  HENT:     Head: Normocephalic and atraumatic.  Cardiovascular:     Rate and Rhythm: Normal rate and regular rhythm.  Pulmonary:     Effort: Pulmonary effort is normal. No respiratory distress.     Breath sounds: Normal breath sounds. No wheezing or rales.  Abdominal:     General: Bowel sounds are normal. There is no distension.     Palpations: Abdomen is soft.     Tenderness: There is no abdominal tenderness. There is no rebound.  Musculoskeletal:     Cervical back: Normal range of motion.  Skin:    General: Skin is warm and dry.  Neurological:     Mental Status: She is alert and oriented to person, place, and time.     Coordination: Coordination normal.     Comments: tremor    Vitals:   04/28/21 1004  BP: 122/80  Pulse: 74  Resp: 18  Temp: 97.9 F (36.6 C)  TempSrc: Oral  SpO2: 94%  Weight: 158 lb 9.6 oz (71.9 kg)  Height: '5\' 4"'$  (1.626 m)    This visit occurred during the  SARS-CoV-2 public health emergency.  Safety protocols were in place, including screening questions prior to the visit, additional usage of staff PPE, and extensive cleaning of exam room while observing appropriate contact time as indicated for disinfecting solutions.   Assessment & Plan:

## 2021-04-28 NOTE — Assessment & Plan Note (Signed)
BP at goal on amlodipine 5 mg daily and lisinopril 5 mg daily. Checking CMP and adjust as needed.

## 2021-04-28 NOTE — Assessment & Plan Note (Signed)
Checking TSH and free T4 and adjust synthroid 88mcg daily as needed. 

## 2021-04-28 NOTE — Assessment & Plan Note (Signed)
Previously doing yoga which was helpful and is wanting to resume has not looked into this in the area yet since moving.

## 2021-04-28 NOTE — Assessment & Plan Note (Signed)
Checking lipid panel and adjust simvastatin 20 mg daily as needed.  

## 2021-04-28 NOTE — Assessment & Plan Note (Addendum)
Doing well advair dosing and walking regularly.

## 2021-04-28 NOTE — Assessment & Plan Note (Signed)
Taking omeprazole and continue.

## 2021-04-28 NOTE — Assessment & Plan Note (Signed)
Using buspar 5 mg BID and refilled today. Coping okay and gets enjoyment from her 2 dogs and family she sees regularly.

## 2021-04-28 NOTE — Assessment & Plan Note (Signed)
Referral to audiology for hearing assessment. 

## 2021-04-28 NOTE — Assessment & Plan Note (Signed)
Seeing neurology and reasonable control with current regimen of primidone and propranolol.

## 2021-04-28 NOTE — Assessment & Plan Note (Signed)
Denies needing any additional treatment. Has done pelvic floor training and keeps up with those exercises.

## 2021-04-28 NOTE — Patient Instructions (Signed)
We will get you in with the audiologist and for the labs today.

## 2021-05-01 ENCOUNTER — Other Ambulatory Visit: Payer: Self-pay | Admitting: Internal Medicine

## 2021-05-01 ENCOUNTER — Encounter: Payer: Self-pay | Admitting: Internal Medicine

## 2021-05-01 DIAGNOSIS — E063 Autoimmune thyroiditis: Secondary | ICD-10-CM

## 2021-05-01 MED ORDER — LEVOTHYROXINE SODIUM 100 MCG PO TABS: 100.0000 ug | ORAL_TABLET | Freq: Every day | ORAL | 1 refills | Status: DC

## 2021-05-08 MED ORDER — LEVOTHYROXINE SODIUM 112 MCG PO TABS: 112.0000 ug | ORAL_TABLET | Freq: Every day | ORAL | 3 refills | Status: DC

## 2021-05-08 MED ORDER — LEVOTHYROXINE SODIUM 100 MCG PO TABS: 100.0000 ug | ORAL_TABLET | Freq: Every day | ORAL | 1 refills | Status: DC

## 2021-05-16 ENCOUNTER — Encounter: Payer: Self-pay | Admitting: Internal Medicine

## 2021-05-23 ENCOUNTER — Other Ambulatory Visit: Payer: Self-pay

## 2021-05-23 ENCOUNTER — Ambulatory Visit: Payer: Medicare Other | Attending: Internal Medicine | Admitting: Audiologist

## 2021-05-23 DIAGNOSIS — H903 Sensorineural hearing loss, bilateral: Secondary | ICD-10-CM | POA: Insufficient documentation

## 2021-05-23 NOTE — Procedures (Signed)
  Outpatient [facility] [place], [place]  [id number] [phone]  AUDIOLOGICAL  EVALUATION  NAME: Vani Borgese     DOB:   05-31-1946      MRN: [id number]                                                                                     DATE: 05/23/2021     REFERENT: Hoyt Koch, MD STATUS: Outpatient DIAGNOSIS: Sensorineural hearing loss (SNHL) of both ears    History: Jerrianne Morine was seen for an audiological evaluation.  Laroyce Wujcik is receiving a hearing evaluation due to concerns for not hearing well and having balance issues. Kay Boeck has difficulty hearing in background noise, crowds, and when people are at a distance. She can hear that people are speaking but it is not clear. She also has a stopped up feeling in her left ear. This difficulty began gradually. No pain or pressure reported in either ear. No tinnitus present in both ears. Sharmika Parody has a history of a balance disorder. She has received physical therapy to help her compensate. She says the balance issue is due to her neurological disease. Lacosta Ax also ruptured her left eardrum at age 3, a child stuck something in her hear. It has since healed.   No other relevant case history reported.   Evaluation:  Otoscopy showed a clear view of the tympanic membranes, bilaterally Tympanometry results were consistent with normal middle ear function, bilaterally with more mobility in the left ear likely due to healing thinner after the rupture Audiometric testing was completed using conventional audiometry with supraural transducer. Speech Recognition Thresholds were consistent with pure tone averages. Word Recognition was excellent at conversation and an elevated level. Pure tone thresholds show normal sloping to a mild sensorineural hearing loss in both ears. Test results are consistent with symmetric mild hearing loss. Hearing loss not consistent with a syndrome associated with balance  issues.  Results:  The test results were reviewed with Audrea Muscat. She understood 100% of words presented at conversational level, and her hearing loss is only in the high pitches. We discussed healthy communication habits, such as only speaking face to face within five feet. Tell people not to try and talk to her from another room or with their back turned. She reported understanding. Annual follow up recommended. No need for hearing aids at this time unless symptoms change.    Recommendations: 1.   No further audiologic testing is needed unless future hearing concerns arise.    Alfonse Alpers  Audiologist, Au.D., CCC-A 05/23/2021  10:29 AM  Cc: Hoyt Koch, MD

## 2021-06-08 ENCOUNTER — Ambulatory Visit: Payer: Medicare Other | Admitting: Podiatry

## 2021-06-12 ENCOUNTER — Other Ambulatory Visit: Payer: Self-pay

## 2021-06-12 ENCOUNTER — Ambulatory Visit (INDEPENDENT_AMBULATORY_CARE_PROVIDER_SITE_OTHER): Payer: Medicare Other | Admitting: Podiatry

## 2021-06-12 DIAGNOSIS — L600 Ingrowing nail: Secondary | ICD-10-CM | POA: Diagnosis not present

## 2021-06-12 DIAGNOSIS — B351 Tinea unguium: Secondary | ICD-10-CM

## 2021-06-12 DIAGNOSIS — M79674 Pain in right toe(s): Secondary | ICD-10-CM

## 2021-06-12 DIAGNOSIS — M79675 Pain in left toe(s): Secondary | ICD-10-CM | POA: Diagnosis not present

## 2021-06-16 NOTE — Progress Notes (Signed)
Subjective: 75 year old female presents the office today for follow-up evaluation of ingrown toenails mostly to the big toes.  She states that she recently had a partial nail avulsion performed and she states it took a long time to heal and she does not want to have that performed again if possible.  She gets tenderness to the toenails but no swelling or redness or any drainage.  Objective: AAO x3, NAD DP/PT pulses palpable bilaterally, CRT less than 3 seconds There is incurvation noted to the hallux toenails bilaterally without any edema, erythema or signs of infection.  The nails are mildly hypertrophic, dystrophic with yellow discoloration.  There is no open lesions bilaterally. No pain with calf compression, swelling, warmth, erythema  Assessment: Ingrown toenails, symptomatic onychomycosis  Plan: -All treatment options discussed with the patient including all alternatives, risks, complications.  -I again discussed with her partial nail avulsion different types of medication we can use to help prevent the nail from coming back again.  She does not want it at this point discussed with in the future if symptoms continue or worsen.  I did debride the central portion ingrown toenails and complications of bleeding given the pain, ingrown toenails well to the mild nail fungus.  As a courtesy debrided other nails x8 without any complications or bleeding. -Patient encouraged to call the office with any questions, concerns, change in symptoms.

## 2021-06-23 ENCOUNTER — Ambulatory Visit (INDEPENDENT_AMBULATORY_CARE_PROVIDER_SITE_OTHER): Payer: Medicare Other

## 2021-06-23 ENCOUNTER — Other Ambulatory Visit (INDEPENDENT_AMBULATORY_CARE_PROVIDER_SITE_OTHER): Payer: Medicare Other

## 2021-06-23 ENCOUNTER — Other Ambulatory Visit: Payer: Self-pay

## 2021-06-23 DIAGNOSIS — Z23 Encounter for immunization: Secondary | ICD-10-CM

## 2021-06-23 DIAGNOSIS — E063 Autoimmune thyroiditis: Secondary | ICD-10-CM | POA: Diagnosis not present

## 2021-06-23 LAB — T4, FREE: Free T4: 0.97 ng/dL (ref 0.60–1.60)

## 2021-06-23 LAB — TSH: TSH: 6.05 u[IU]/mL — ABNORMAL HIGH (ref 0.35–5.50)

## 2021-06-23 NOTE — Progress Notes (Signed)
Pt was given High Dose flu vacc w/o any complications.

## 2021-06-26 ENCOUNTER — Encounter: Payer: Self-pay | Admitting: Internal Medicine

## 2021-06-26 DIAGNOSIS — E063 Autoimmune thyroiditis: Secondary | ICD-10-CM

## 2021-06-26 MED ORDER — LEVOTHYROXINE SODIUM 112 MCG PO TABS: 112.0000 ug | ORAL_TABLET | Freq: Every day | ORAL | 1 refills | Status: DC

## 2021-06-29 ENCOUNTER — Telehealth: Payer: Self-pay | Admitting: Internal Medicine

## 2021-06-29 NOTE — Telephone Encounter (Signed)
Left message for patient to call me back at [phone] to schedule Medicare Annual Wellness Visit   No hx of AWV eligible as of 05/04/21  Please schedule at anytime with [facility] Advisor if patient calls the office back.    40 Minutes appointment   Any questions, please call me at [phone]

## 2021-07-19 ENCOUNTER — Ambulatory Visit (INDEPENDENT_AMBULATORY_CARE_PROVIDER_SITE_OTHER): Payer: Medicare Other | Admitting: Pulmonary Disease

## 2021-07-19 ENCOUNTER — Other Ambulatory Visit: Payer: Self-pay

## 2021-07-19 ENCOUNTER — Encounter: Payer: Self-pay | Admitting: Pulmonary Disease

## 2021-07-19 VITALS — BP 118/82 | HR 71 | Temp 97.7°F | Ht 64.0 in | Wt 160.2 lb

## 2021-07-19 DIAGNOSIS — J4540 Moderate persistent asthma, uncomplicated: Secondary | ICD-10-CM

## 2021-07-19 NOTE — Progress Notes (Signed)
Synopsis: Referred in January 2022 for shortness of breath  Subjective:   PATIENT ID: [name]: female DOB: 05/16/1946, MRN: [id number]  HPI  Chief Complaint  Patient presents with   Follow-up    Wheezing when pt walks up a hill.   Mariah Snyder is a 75 year old woman, former smoker with asthma and GERD who returns to pulmonary clinic for asthma follow up.   She reports her shortness of breath is better since last visit with increasing her advair inhaler to 230-48mcg 2 puffs twice daily at last visit. She continues to have some dyspnea and wheezing with exertion on walking uphill or on an incline.   She denies any changes with her breathing due to the cold weather.  OV 03/13/21 She has had an increase in shortness of breath since last visit with activity.  She stopped using Fluticasone nasal spray as this gave her nausea which led to vomiting.  She is now using simple saline nasal spray.  She denies any further sinus drainage issues.  OV 10/2020: She had a dentist appointment who reported she had concerning findings for thrush with white patches and redness at the right posterior area of her throat. There was concern that this was related to the spiriva inhaler she was recently started on. She had an appointment with Wyn Quaker, NP on 11/01/20 for evaluation of potential medication reaction.   Prior to her dentist raising concern for thrush, she had no issues of sore throat or oral irritation. She has been on advair for many years and consistently rinses her mouth out and has not previously had issues with thrush.   The spiriva inhaler was stopped and she was treated with 2-3 days of nystatin.   She has continued on the advair and denies any issues with her breathing currently. She is not experiencing the wheezing she previously complained of.   She complains of cough and post-nasal sinus drainage. The cough is worse in the mornings with a whitish sputum.   OV 10/10/20 She  reports having progressive shortness of breath and wheezing since September. She notices these symptoms when she walks. She reports being diagnosed with asthma in 2014 when she initially developed the shortness of breath and wheezing. She has been on advair HFA since then with adquate control of her symptoms.   She tested positive for covid on 10/03/20 which led to fatigue and worsening of her shortness of breath. She also reports sinus congestion and post nasal drip since covid.   Her asthma triggers include seasonal allergies worse in the fall and spring. Strong perfumes, colonges and cigarette smoke also make her breathing worse. She has not required prednisone or antibiotics in the past year.   She has positional sleep apnea based on previous sleep testing and trys to avoid sleeping on her back.   She was exposed to second hand smoke growing up as her father smoked in the home. She also smoked socially in the past having cigarettes when drinking.   Her mother and youngest brother have asthma.   Past Medical History:  Diagnosis Date   Asthma    Asthma    Fibromyalgia    GERD (gastroesophageal reflux disease)    Hyperlipidemia    Hypertension    Hypothyroidism      Family History  Problem Relation Age of Onset   Asthma Mother    Tremor Mother    Aneurysm Father    Heart attack Brother  Died from Heart Attack at age 41   Other Brother        Post Polio Syndrome   Tremor Child      Social History   Socioeconomic History   Marital status: Widowed    Spouse name: Not on file   Number of children: 2   Years of education: Not on file   Highest education level: Not on file  Occupational History   Occupation: retired    Comment: worked in radiology  Tobacco Use   Smoking status: Former    Types: Cigarettes   Smokeless tobacco: Never  Scientific laboratory technician Use: Never used  Substance and Sexual Activity   Alcohol use: Yes    Comment: Has a beer or glass of wine  occasionally   Drug use: Never   Sexual activity: Not on file  Other Topics Concern   Not on file  Social History Narrative   Right Handed    Lives in a two story home   Social Determinants of Health   Financial Resource Strain: Not on file  Food Insecurity: Not on file  Transportation Needs: Not on file  Physical Activity: Not on file  Stress: Not on file  Social Connections: Not on file  Intimate Partner Violence: Not on file     Allergies  Allergen Reactions   Sulfa Antibiotics Other (See Comments)    Causes headaches. Causes headaches. Causes headaches.    Grass Extracts [Gramineae Pollens] Itching and Other (See Comments)    Runny itchy eyes & nose. Rhinitis  Runny itchy eyes & nose. Rhinitis  Rhinitis  Runny itchy eyes & nose. Rhinitis  Runny itchy eyes & nose. Rhinitis   stuffy nose, itchy eyes, asthma    Molds & Smuts Itching and Other (See Comments)    Runny itchy eyes & nose. Runny itchy eyes & nose. Runny itchy eyes & nose. Runny itchy eyes & nose.      Outpatient Medications Prior to Visit  Medication Sig Dispense Refill   acetaminophen (TYLENOL) 325 MG tablet Take 650 mg by mouth every 6 (six) hours as needed.     amLODipine (NORVASC) 5 MG tablet Take 1 tablet (5 mg total) by mouth daily. 90 tablet 3   aspirin 81 MG EC tablet Take 1 tablet by mouth daily.     busPIRone (BUSPAR) 5 MG tablet Take 1 tablet (5 mg total) by mouth 2 (two) times daily. Take one tablet in the morning and 1 tablet at bedtime 180 tablet 3   Carboxymethylcell-Hypromellose (GENTEAL) 0.25-0.3 % GEL Apply to eye. Apply to each eye at bedtime     carboxymethylcellul-glycerin (REFRESH OPTIVE) 0.5-0.9 % ophthalmic solution Apply to eye.     Cholecalciferol (VITAMIN D3) 50 MCG (2000 UT) capsule Take by mouth. Take one capsule daily     clobetasol ointment (TEMOVATE) 0.05 % Apply to affected area twice a week as directed 90 g 3   conjugated estrogens (PREMARIN) vaginal cream Place  vaginally 2 (two) times a week. Apply 0.5g into vagina 2 x per week 90 g 3   fluticasone-salmeterol (ADVAIR HFA) 230-21 MCG/ACT inhaler Inhale 2 puffs into the lungs 2 (two) times daily. (Patient taking differently: Inhale 2 puffs into the lungs 2 (two) times daily. 2 puffs in the morning and 2 puffs in the evening.) 3 each 3   levothyroxine (SYNTHROID) 112 MCG tablet Take 1 tablet (112 mcg total) by mouth daily. 90 tablet 1   lisinopril (ZESTRIL) 5 MG  tablet Take 1 tablet (5 mg total) by mouth daily. 90 tablet 3   metroNIDAZOLE (METROGEL) 0.75 % gel Apply topically 2 (two) times daily. 180 g 3   Omega-3 Fatty Acids (FISH OIL) 1000 MG CAPS Take 1 capsule by mouth daily.     omeprazole (PRILOSEC) 20 MG capsule Take 20 mg by mouth daily. Take two capsules daily     prednisoLONE acetate (PRED FORTE) 1 % ophthalmic suspension Apply to eye.     pregabalin (LYRICA) 75 MG capsule Take 75 mg by mouth. Take 2-3 capsules daily for nerve pain     primidone (MYSOLINE) 50 MG tablet Take 3 tablets (150 mg total) by mouth 2 (two) times daily. 540 tablet 1   propranolol (INDERAL) 10 MG tablet Take 1 tablet (10 mg total) by mouth 2 (two) times daily. 180 tablet 1   simvastatin (ZOCOR) 20 MG tablet Take 1 tablet (20 mg total) by mouth daily. 90 tablet 3   triamcinolone cream (KENALOG) 0.1 % Apply 2 times a day to affected areas.  Stop when smooth.     olopatadine (PATANOL) 0.1 % ophthalmic solution 1 drop 2 (two) times daily.     No facility-administered medications prior to visit.   Review of Systems  Constitutional:  Negative for chills, fever, malaise/fatigue and weight loss.  HENT:  Negative for congestion, sinus pain and sore throat.   Eyes: Negative.   Respiratory:  Positive for shortness of breath and wheezing. Negative for cough, hemoptysis and sputum production.   Cardiovascular:  Negative for chest pain, palpitations, orthopnea, claudication and leg swelling.  Gastrointestinal:  Negative for abdominal  pain, heartburn, nausea and vomiting.  Genitourinary: Negative.   Musculoskeletal:  Negative for joint pain and myalgias.  Skin:  Negative for rash.  Neurological:  Negative for weakness.  Endo/Heme/Allergies: Negative.   Psychiatric/Behavioral: Negative.      Objective:   Vitals:   07/19/21 0942  BP: 118/82  Pulse: 71  Temp: 97.7 F (36.5 C)  SpO2: 97%  Weight: 160 lb 3.2 oz (72.7 kg)  Height: 5\' 4"  (1.626 m)     Physical Exam Constitutional:      Appearance: Normal appearance. She is normal weight.  HENT:     Head: Normocephalic and atraumatic.     Nose: Nose normal.     Mouth/Throat:     Mouth: Mucous membranes are moist.  Eyes:     General: No scleral icterus.    Conjunctiva/sclera: Conjunctivae normal.  Cardiovascular:     Rate and Rhythm: Normal rate and regular rhythm.     Pulses: Normal pulses.     Heart sounds: Normal heart sounds.  Pulmonary:     Effort: Pulmonary effort is normal.     Breath sounds: Normal breath sounds.  Musculoskeletal:     Right lower leg: No edema.     Left lower leg: No edema.  Skin:    General: Skin is warm and dry.  Neurological:     General: No focal deficit present.    CBC    Component Value Date/Time   WBC 7.3 04/28/2021 1050   RBC 4.61 04/28/2021 1050   HGB 13.4 04/28/2021 1050   HCT 40.5 04/28/2021 1050   PLT 244.0 04/28/2021 1050   MCV 88.0 04/28/2021 1050   MCHC 33.0 04/28/2021 1050   RDW 14.0 04/28/2021 1050   Chest imaging: CXR 10/03/2017 Radiographically clear lungs.  No pleural effusion or pneumothorax.  Cardiomediastinal silhouette unremarkable.   PFT: PFT Results Latest  Ref Rng & Units 03/13/2021  FVC-Pre L 1.96  FVC-Predicted Pre % 74  FVC-Post L 1.96  FVC-Predicted Post % 74  Pre FEV1/FVC % % 82  Post FEV1/FCV % % 83  FEV1-Pre L 1.61  FEV1-Predicted Pre % 81  FEV1-Post L 1.64  DLCO uncorrected ml/min/mmHg 14.41  DLCO UNC% % 78  DLCO corrected ml/min/mmHg 14.41  DLCO COR %Predicted % 78   DLVA Predicted % 101  TLC L 3.84  TLC % Predicted % 78  RV % Predicted % 81  PFT 2022: Mild restrictive defect    Assessment & Plan:   Moderate persistent asthma without complication  Discussion: Mariah Snyder is a 75 year old woman, former smoker with asthma and GERD who returns to pulmonary clinic for asthma follow up.  She is to continue Advair 230-25 MCG 2 puffs twice daily and as needed albuterol. She will try using albuterol prior to her walks that include hills or inclines to monitor for any improvement in her symptoms of dyspnea and wheezing. There may be concern for excessive dynamic airway collapse during times of heavy exertion which could account for her symptoms. We will consider HRCT chest in the future.   Follow up in 9 months.   Freda Jackson, MD [facility] Office: [phone]   Current Outpatient Medications:    acetaminophen (TYLENOL) 325 MG tablet, Take 650 mg by mouth every 6 (six) hours as needed., Disp: , Rfl:    amLODipine (NORVASC) 5 MG tablet, Take 1 tablet (5 mg total) by mouth daily., Disp: 90 tablet, Rfl: 3   aspirin 81 MG EC tablet, Take 1 tablet by mouth daily., Disp: , Rfl:    busPIRone (BUSPAR) 5 MG tablet, Take 1 tablet (5 mg total) by mouth 2 (two) times daily. Take one tablet in the morning and 1 tablet at bedtime, Disp: 180 tablet, Rfl: 3   Carboxymethylcell-Hypromellose (GENTEAL) 0.25-0.3 % GEL, Apply to eye. Apply to each eye at bedtime, Disp: , Rfl:    carboxymethylcellul-glycerin (REFRESH OPTIVE) 0.5-0.9 % ophthalmic solution, Apply to eye., Disp: , Rfl:    Cholecalciferol (VITAMIN D3) 50 MCG (2000 UT) capsule, Take by mouth. Take one capsule daily, Disp: , Rfl:    clobetasol ointment (TEMOVATE) 0.05 %, Apply to affected area twice a week as directed, Disp: 90 g, Rfl: 3   conjugated estrogens (PREMARIN) vaginal cream, Place vaginally 2 (two) times a week. Apply 0.5g into vagina 2 x per week, Disp: 90 g, Rfl: 3    fluticasone-salmeterol (ADVAIR HFA) 230-21 MCG/ACT inhaler, Inhale 2 puffs into the lungs 2 (two) times daily. (Patient taking differently: Inhale 2 puffs into the lungs 2 (two) times daily. 2 puffs in the morning and 2 puffs in the evening.), Disp: 3 each, Rfl: 3   levothyroxine (SYNTHROID) 112 MCG tablet, Take 1 tablet (112 mcg total) by mouth daily., Disp: 90 tablet, Rfl: 1   lisinopril (ZESTRIL) 5 MG tablet, Take 1 tablet (5 mg total) by mouth daily., Disp: 90 tablet, Rfl: 3   metroNIDAZOLE (METROGEL) 0.75 % gel, Apply topically 2 (two) times daily., Disp: 180 g, Rfl: 3   Omega-3 Fatty Acids (FISH OIL) 1000 MG CAPS, Take 1 capsule by mouth daily., Disp: , Rfl:    omeprazole (PRILOSEC) 20 MG capsule, Take 20 mg by mouth daily. Take two capsules daily, Disp: , Rfl:    prednisoLONE acetate (PRED FORTE) 1 % ophthalmic suspension, Apply to eye., Disp: , Rfl:    pregabalin (LYRICA)  75 MG capsule, Take 75 mg by mouth. Take 2-3 capsules daily for nerve pain, Disp: , Rfl:    primidone (MYSOLINE) 50 MG tablet, Take 3 tablets (150 mg total) by mouth 2 (two) times daily., Disp: 540 tablet, Rfl: 1   propranolol (INDERAL) 10 MG tablet, Take 1 tablet (10 mg total) by mouth 2 (two) times daily., Disp: 180 tablet, Rfl: 1   simvastatin (ZOCOR) 20 MG tablet, Take 1 tablet (20 mg total) by mouth daily., Disp: 90 tablet, Rfl: 3   triamcinolone cream (KENALOG) 0.1 %, Apply 2 times a day to affected areas.  Stop when smooth., Disp: , Rfl:    olopatadine (PATANOL) 0.1 % ophthalmic solution, 1 drop 2 (two) times daily., Disp: , Rfl:

## 2021-07-19 NOTE — Patient Instructions (Addendum)
Continue advair inhaler 2 puffs twice daily  Use albuterol 1-2 puffs every 4-6 hours as needed. You can try to use the albuterol prior to walking uphill to see if it helps with the shortness of breath or wheezing.   Follow up in 9 months.

## 2021-08-14 ENCOUNTER — Encounter: Payer: Self-pay | Admitting: Podiatry

## 2021-08-14 ENCOUNTER — Other Ambulatory Visit: Payer: Self-pay

## 2021-08-14 ENCOUNTER — Ambulatory Visit (INDEPENDENT_AMBULATORY_CARE_PROVIDER_SITE_OTHER): Payer: Medicare Other | Admitting: Podiatry

## 2021-08-14 DIAGNOSIS — L600 Ingrowing nail: Secondary | ICD-10-CM | POA: Diagnosis not present

## 2021-08-14 DIAGNOSIS — M79674 Pain in right toe(s): Secondary | ICD-10-CM

## 2021-08-14 MED ORDER — FLUCONAZOLE 150 MG PO TABS: 150.0000 mg | ORAL_TABLET | Freq: Once | ORAL | 0 refills | Status: AC

## 2021-08-14 MED ORDER — CEPHALEXIN 500 MG PO CAPS: 500.0000 mg | ORAL_CAPSULE | Freq: Three times a day (TID) | ORAL | 0 refills | Status: DC

## 2021-08-14 NOTE — Patient Instructions (Signed)

## 2021-08-16 NOTE — Progress Notes (Signed)
Subjective: 75 year old female presents the office today for concerns of continued pain on ingrown toenail mostly her right big toe, medial aspect.  She said this point she was going to have the corner removed this is causing quite a bit of discomfort.  No recent drainage or pus.  The area is tender with pressure and with ambulation.  Objective: AAO x3, NAD DP/PT pulses palpable bilaterally, CRT less than 3 seconds Incurvation present the right medial hallux nail border with localized edema.  There is no drainage or pus or ascending cellulitis.  No significant pain to the lateral nail border today or to the other nails.  No pain with calf compression, swelling, warmth, erythema  Assessment: Ingrown toenail right medial hallux  Plan: -All treatment options discussed with the patient including all alternatives, risks, complications.  -At this time, the patient is requesting partial nail removal with chemical matricectomy to the symptomatic portion of the nail. Risks and complications were discussed with the patient for which they understand and written consent was obtained. Under sterile conditions a total of 3 mL of a mixture of 2% lidocaine plain and 0.5% Marcaine plain was infiltrated in a hallux block fashion. Once anesthetized, the skin was prepped in sterile fashion. A tourniquet was then applied. Next the medial aspect of hallux nail border was then sharply excised making sure to remove the entire offending nail border. Once the nails were ensured to be removed area was debrided and the underlying skin was intact. There is no purulence identified in the procedure. Next phenol was then applied under standard conditions and copiously irrigated. Silvadene was applied. A dry sterile dressing was applied. After application of the dressing the tourniquet was removed and there is found to be an immediate capillary refill time to the digit. The patient tolerated the procedure well any complications. Post  procedure instructions were discussed the patient for which he verbally understood. Follow-up in one week for nail check or sooner if any problems are to arise. Discussed signs/symptoms of infection and directed to call the office immediately should any occur or go directly to the emergency room. In the meantime, encouraged to call the office with any questions, concerns, changes symptoms. -Keflex -Patient encouraged to call the office with any questions, concerns, change in symptoms.

## 2021-08-18 ENCOUNTER — Encounter: Payer: Self-pay | Admitting: Internal Medicine

## 2021-08-18 ENCOUNTER — Other Ambulatory Visit (INDEPENDENT_AMBULATORY_CARE_PROVIDER_SITE_OTHER): Payer: Medicare Other

## 2021-08-18 ENCOUNTER — Other Ambulatory Visit: Payer: Self-pay

## 2021-08-18 DIAGNOSIS — E063 Autoimmune thyroiditis: Secondary | ICD-10-CM

## 2021-08-18 LAB — TSH: TSH: 1.87 u[IU]/mL (ref 0.35–5.50)

## 2021-08-18 LAB — T4, FREE: Free T4: 1.2 ng/dL (ref 0.60–1.60)

## 2021-08-28 ENCOUNTER — Encounter: Payer: Self-pay | Admitting: Podiatry

## 2021-08-28 ENCOUNTER — Other Ambulatory Visit: Payer: Self-pay

## 2021-08-28 ENCOUNTER — Ambulatory Visit (INDEPENDENT_AMBULATORY_CARE_PROVIDER_SITE_OTHER): Payer: Medicare Other | Admitting: Podiatry

## 2021-08-28 DIAGNOSIS — M79674 Pain in right toe(s): Secondary | ICD-10-CM

## 2021-08-28 DIAGNOSIS — L600 Ingrowing nail: Secondary | ICD-10-CM

## 2021-08-30 NOTE — Progress Notes (Signed)
Subjective: 75 year old female presents the office today for follow-up evaluation after undergoing right medial hallux nail border partial nail removal.  She said that the corners been doing well but she started some discomfort on the lateral aspect.  She states it is difficult for her to trim her toenails.  No swelling or redness or any drainage that she reports.  She has no new concerns.  Objective: AAO x3, NAD DP/PT pulses palpable bilaterally, CRT less than 3 seconds Status post partial nail avulsion right medial hallux and small granulation tissue is present and a scab is present.  No tenderness palpation of this border.  There is mild incurvation to the right lateral nail border with mild tenderness palpation distally but there is no edema, erythema or signs of infection.  The remainder the nails are also elongated mildly dystrophic. No pain with calf compression, swelling, warmth, erythema  Assessment: Status post partial nail avulsion, ingrown toenail  Plan: -All treatment options discussed with the patient including all alternatives, risks, complications.  -Procedure site appears to be healing well.  I did sharply debride the symptomatic portion of the right lateral nail border without any complications or bleeding.  If symptoms continue may need to pursue the partial nail avulsion.  I continue soaking, wash the foot with soap and water daily and apply a small amount of antibiotic ointment and a bandage of the day but leave the area open at nighttime.  As a courtesy debride the other nails with any complications or bleeding as well. -Patient encouraged to call the office with any questions, concerns, change in symptoms.   Trula Slade DPM

## 2021-10-04 ENCOUNTER — Encounter: Payer: Self-pay | Admitting: Internal Medicine

## 2021-10-06 ENCOUNTER — Encounter: Payer: Self-pay | Admitting: Internal Medicine

## 2021-10-09 ENCOUNTER — Other Ambulatory Visit: Payer: Self-pay

## 2021-10-09 MED ORDER — LEVOTHYROXINE SODIUM 112 MCG PO TABS: 112.0000 ug | ORAL_TABLET | Freq: Every day | ORAL | 1 refills | Status: DC

## 2021-10-11 ENCOUNTER — Other Ambulatory Visit: Payer: Self-pay

## 2021-10-11 ENCOUNTER — Encounter: Payer: Self-pay | Admitting: Internal Medicine

## 2021-10-11 ENCOUNTER — Ambulatory Visit (INDEPENDENT_AMBULATORY_CARE_PROVIDER_SITE_OTHER): Payer: Medicare Other

## 2021-10-11 ENCOUNTER — Ambulatory Visit (INDEPENDENT_AMBULATORY_CARE_PROVIDER_SITE_OTHER): Payer: Medicare Other | Admitting: Internal Medicine

## 2021-10-11 VITALS — BP 122/76 | HR 75 | Temp 97.9°F | Ht 64.0 in | Wt 158.6 lb

## 2021-10-11 DIAGNOSIS — G44329 Chronic post-traumatic headache, not intractable: Secondary | ICD-10-CM

## 2021-10-11 DIAGNOSIS — G8929 Other chronic pain: Secondary | ICD-10-CM

## 2021-10-11 DIAGNOSIS — M25552 Pain in left hip: Secondary | ICD-10-CM

## 2021-10-11 DIAGNOSIS — M5442 Lumbago with sciatica, left side: Secondary | ICD-10-CM

## 2021-10-11 NOTE — Progress Notes (Signed)
° °  Subjective:   Patient ID: Mariah [place], female    DOB: 28-Aug-1946, 76 y.o.   MRN: [id number]  HPI The patient is a 76 YO female coming in for 2 falls and injuries which have not resolved. 3 months ago fell off golf cart and pain in the low pain and radiating down into left hip and left which has not really caused tons of problems. Hurts only with lying down at night. No pain with walking. Then fall about 2 months ago with injury to head no LOC. Headaches started within a few days and have been daily multiple times a day since then. No nausea or vomiting, no change in vision. Did not seek care for them.   Review of Systems  Constitutional: Negative.   HENT: Negative.    Eyes: Negative.   Respiratory:  Negative for cough, chest tightness and shortness of breath.   Cardiovascular:  Negative for chest pain, palpitations and leg swelling.  Gastrointestinal:  Negative for abdominal distention, abdominal pain, constipation, diarrhea, nausea and vomiting.  Musculoskeletal:  Positive for arthralgias, back pain and myalgias.  Skin: Negative.   Neurological:  Positive for headaches. Negative for dizziness, speech difficulty, weakness, light-headedness and numbness.  Psychiatric/Behavioral: Negative.     Objective:  Physical Exam Constitutional:      Appearance: She is well-developed.  HENT:     Head: Normocephalic and atraumatic.  Eyes:     Extraocular Movements: Extraocular movements intact.     Pupils: Pupils are equal, round, and reactive to light.  Cardiovascular:     Rate and Rhythm: Normal rate and regular rhythm.  Pulmonary:     Effort: Pulmonary effort is normal. No respiratory distress.     Breath sounds: Normal breath sounds. No wheezing or rales.  Abdominal:     General: Bowel sounds are normal. There is no distension.     Palpations: Abdomen is soft.     Tenderness: There is no abdominal tenderness. There is no rebound.  Musculoskeletal:        General: Tenderness present.      Cervical back: Normal range of motion.     Comments: Tenderness to palpation SI region left  Skin:    General: Skin is warm and dry.  Neurological:     Mental Status: She is alert and oriented to person, place, and time.     Coordination: Coordination normal.    Vitals:   10/11/21 0907  BP: 122/76  Pulse: 75  Temp: 97.9 F (36.6 C)  TempSrc: Oral  SpO2: 96%  Weight: 158 lb 9.6 oz (71.9 kg)  Height: 5\' 4"  (1.626 m)    This visit occurred during the SARS-CoV-2 public health emergency.  Safety protocols were in place, including screening questions prior to the visit, additional usage of staff PPE, and extensive cleaning of exam room while observing appropriate contact time as indicated for disinfecting solutions.   Assessment & Plan:  Visit time 25 minutes in face to face communication with patient and coordination of care, additional 10 minutes spent in record review, coordination or care, ordering tests, communicating/referring to other healthcare professionals, documenting in medical records all on the same day of the visit for total time  35 minutes spent on the visit.

## 2021-10-11 NOTE — Patient Instructions (Signed)
We will check the x-rays today. We will hold off on the brain CT and if no change in the headaches we can do this.

## 2021-10-12 DIAGNOSIS — G44309 Post-traumatic headache, unspecified, not intractable: Secondary | ICD-10-CM | POA: Insufficient documentation

## 2021-10-12 DIAGNOSIS — M25552 Pain in left hip: Secondary | ICD-10-CM | POA: Insufficient documentation

## 2021-10-12 NOTE — Assessment & Plan Note (Signed)
Given lack of improvement in 3 months x-ray lumbar is indicated as well as pelvis and left hip.

## 2021-10-12 NOTE — Assessment & Plan Note (Signed)
She has not taken anything for these. They are daily. We will plan to give this 2-3 weeks to see if they improve. If they do not we will plan CT head. She does not have any signs of increased ICP on exam today and given that we are 2 months out from injury if there was significant ICH this would have presented by now. She could have had small ICH and the blood products could be irritating the lining of the brain causing daily headaches. Can try otc tylenol or advil for these or heat/ice.

## 2021-10-12 NOTE — Assessment & Plan Note (Signed)
Left pelvis and hip x-ray is indicated as she has not had improvement in 3 months and this is post-traumatic.

## 2021-10-13 NOTE — Progress Notes (Signed)
Assessment/Plan:    1.  Essential Tremor  -Continue primidone, 50 mg, 3 tablets twice per day.  She asked if an increase would help, but I really do not think so.  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  Discussed again about the value of Botox along with risks and benefits.  She is not interested in this therapy right now.  2.  Possible TIA, March, 2022             -Event was very short-lived (seconds) so it is questionable whether this really represented TIA.  Nonetheless, Dr. Philippa Chester at [facility] worked this up extensively.  MRI and MRA were negative for anything acute.  Zio patch was completed and was unremarkable.  Patient now on aspirin, 81 mg daily.  3.  Probable Lumbar radiculopathy  -MRI lumbar spine  -discussed PT.  Will consider once MRI completed.  Can also consider injections with Dr. Nelva Bush   Subjective:   Mariah Snyder was seen today in follow up for essential tremor.  My previous records were reviewed prior to todays visit.  Last visit, she was having some daytime sleepiness and we told the patient she could trial taking primidone, 2 tablets in the morning and 4 tablets at night instead of 3 tablets twice per day and see how she did.  She reports that she is still taking 3 po bid.  States that it was helping until she had a change in her thyroid medication.  Overall, she is feeling a bit weak.    Note that she fell about 3 months ago off a golf cart and ended up injuring her and radiating into the left leg.  She also fell about 2 months ago and hit her head.  She saw her primary care physician for these falls last week.  She was told to try over-the-counter Tylenol or Advil and see how she does for the headaches for a few weeks.  Pt states that things are just not getting better - "its in the sacroiliac joint and down the L leg."  Issue is mostly at night.  Radiates mostly posterior leg  and down lateral L leg.  She had another fall when trying to stand up and put on shoes.    Current prescribed movement disorder medications: Primidone, 50 mg, 3 tablets twice per day   ALLERGIES:   Allergies  Allergen Reactions   Sulfa Antibiotics Other (See Comments)    Causes headaches. Causes headaches. Causes headaches.    Grass Extracts [Gramineae Pollens] Itching and Other (See Comments)    Runny itchy eyes & nose. Rhinitis  Runny itchy eyes & nose. Rhinitis  Rhinitis  Runny itchy eyes & nose. Rhinitis  Runny itchy eyes & nose. Rhinitis   stuffy nose, itchy eyes, asthma    Molds & Smuts Itching and Other (See Comments)    Runny itchy eyes & nose. Runny itchy eyes & nose. Runny itchy eyes & nose. Runny itchy eyes & nose.     CURRENT MEDICATIONS:  Current Meds  Medication Sig   acetaminophen (TYLENOL) 325 MG tablet Take 650 mg by mouth every 6 (six) hours as needed.   amLODipine (NORVASC) 5 MG tablet Take 1 tablet (5 mg total) by mouth daily.   aspirin 81 MG EC tablet Take 1 tablet by mouth daily.   busPIRone (BUSPAR) 5 MG tablet Take 1 tablet (5 mg total)  by mouth 2 (two) times daily. Take one tablet in the morning and 1 tablet at bedtime   Carboxymethylcell-Hypromellose (GENTEAL) 0.25-0.3 % GEL Apply to eye. Apply to each eye at bedtime   carboxymethylcellul-glycerin (REFRESH OPTIVE) 0.5-0.9 % ophthalmic solution Apply to eye.   Cholecalciferol (VITAMIN D3) 50 MCG (2000 UT) capsule Take by mouth. Take one capsule daily   clobetasol ointment (TEMOVATE) 0.05 % Apply to affected area twice a week as directed   conjugated estrogens (PREMARIN) vaginal cream Place vaginally 2 (two) times a week. Apply 0.5g into vagina 2 x per week   fluticasone-salmeterol (ADVAIR HFA) 230-21 MCG/ACT inhaler Inhale 2 puffs into the lungs 2 (two) times daily. (Patient taking differently: Inhale 2 puffs into the lungs 2 (two) times daily. 2 puffs in the morning and 2 puffs in the evening.)    levothyroxine (SYNTHROID) 112 MCG tablet Take 1 tablet (112 mcg total) by mouth daily.   lisinopril (ZESTRIL) 5 MG tablet Take 1 tablet (5 mg total) by mouth daily.   metroNIDAZOLE (METROGEL) 0.75 % gel Apply topically 2 (two) times daily.   Omega-3 Fatty Acids (FISH OIL) 1000 MG CAPS Take 1 capsule by mouth daily.   omeprazole (PRILOSEC) 20 MG capsule Take 20 mg by mouth daily. Take two capsules daily   prednisoLONE acetate (PRED FORTE) 1 % ophthalmic suspension Apply to eye.   pregabalin (LYRICA) 75 MG capsule Take 75 mg by mouth. Take 2-3 capsules daily for nerve pain   primidone (MYSOLINE) 50 MG tablet Take 3 tablets (150 mg total) by mouth 2 (two) times daily.   propranolol (INDERAL) 10 MG tablet Take 1 tablet (10 mg total) by mouth 2 (two) times daily.   simvastatin (ZOCOR) 20 MG tablet Take 1 tablet (20 mg total) by mouth daily.   triamcinolone cream (KENALOG) 0.1 % Apply 2 times a day to affected areas.  Stop when smooth.      Objective:    PHYSICAL EXAMINATION:    VITALS:   Vitals:   10/17/21 1044  BP: 132/84  Pulse: 63  SpO2: 99%  Height: 5\' 3"  (1.6 m)    GEN:  The patient appears stated age and is in NAD. HEENT:  Normocephalic, atraumatic.  The mucous membranes are moist.  Neck/HEME:  There are no carotid bruits bilaterally.  Head to the right with head tremor, complex.  Head tremor better when neck to the L  Neurological examination:  Orientation: The patient is alert and oriented x3. Cranial nerves: There is good facial symmetry. The speech is fluent and clear. Soft palate rises symmetrically and there is no tongue deviation. Hearing is intact to conversational tone. Sensation: Sensation is intact to light touch throughout.  Vibration intact bilateral ankle Motor: Strength is at least antigravity x4. DTR's:0-1 throughout  Movement examination: Tone: There is normal tone in the UE/LE Abnormal movements: there is head tremor, as above.  There is postural  tremor, mild, RUE>LUE Coordination:  There is no decremation with RAM's Gait and Station: The patient's stride length is good I have reviewed and interpreted the following labs independently   Chemistry      Component Value Date/Time   NA 141 04/28/2021 1050   K 4.0 04/28/2021 1050   CL 107 04/28/2021 1050   CO2 27 04/28/2021 1050   BUN 18 04/28/2021 1050   CREATININE 0.71 04/28/2021 1050      Component Value Date/Time   CALCIUM 9.2 04/28/2021 1050   ALKPHOS 76 04/28/2021 1050   AST  18 04/28/2021 1050   ALT 16 04/28/2021 1050   BILITOT 0.2 04/28/2021 1050      Lab Results  Component Value Date   WBC 7.3 04/28/2021   HGB 13.4 04/28/2021   HCT 40.5 04/28/2021   MCV 88.0 04/28/2021   PLT 244.0 04/28/2021   Lab Results  Component Value Date   TSH 1.87 08/18/2021     Chemistry      Component Value Date/Time   NA 141 04/28/2021 1050   K 4.0 04/28/2021 1050   CL 107 04/28/2021 1050   CO2 27 04/28/2021 1050   BUN 18 04/28/2021 1050   CREATININE 0.71 04/28/2021 1050      Component Value Date/Time   CALCIUM 9.2 04/28/2021 1050   ALKPHOS 76 04/28/2021 1050   AST 18 04/28/2021 1050   ALT 16 04/28/2021 1050   BILITOT 0.2 04/28/2021 1050         Total time spent on today's visit was 7minutes, including both face-to-face time and nonface-to-face time.  Time included that spent on review of records (prior notes available to me/labs/imaging if pertinent), discussing treatment and goals, answering patient's questions and coordinating care.  Cc:  Hoyt Koch, MD

## 2021-10-17 ENCOUNTER — Ambulatory Visit (INDEPENDENT_AMBULATORY_CARE_PROVIDER_SITE_OTHER): Payer: Medicare Other | Admitting: Neurology

## 2021-10-17 ENCOUNTER — Other Ambulatory Visit: Payer: Self-pay

## 2021-10-17 ENCOUNTER — Encounter: Payer: Self-pay | Admitting: Neurology

## 2021-10-17 VITALS — BP 132/84 | HR 63 | Ht 63.0 in

## 2021-10-17 DIAGNOSIS — G243 Spasmodic torticollis: Secondary | ICD-10-CM | POA: Diagnosis not present

## 2021-10-17 DIAGNOSIS — G250 Essential tremor: Secondary | ICD-10-CM

## 2021-10-17 DIAGNOSIS — M5416 Radiculopathy, lumbar region: Secondary | ICD-10-CM | POA: Diagnosis not present

## 2021-10-17 NOTE — Patient Instructions (Signed)
A referral to [facility] has been placed for your MRI someone will contact you directly to schedule your appt. They are located at [address] Please contact them directly by calling [phone] with any questions regarding your referral.  

## 2021-10-30 ENCOUNTER — Ambulatory Visit (INDEPENDENT_AMBULATORY_CARE_PROVIDER_SITE_OTHER): Payer: Medicare Other | Admitting: Podiatry

## 2021-10-30 ENCOUNTER — Other Ambulatory Visit: Payer: Self-pay

## 2021-10-30 ENCOUNTER — Encounter: Payer: Self-pay | Admitting: Podiatry

## 2021-10-30 DIAGNOSIS — M79675 Pain in left toe(s): Secondary | ICD-10-CM

## 2021-10-30 DIAGNOSIS — M79674 Pain in right toe(s): Secondary | ICD-10-CM

## 2021-10-30 DIAGNOSIS — B351 Tinea unguium: Secondary | ICD-10-CM

## 2021-11-01 ENCOUNTER — Encounter: Payer: Self-pay | Admitting: Internal Medicine

## 2021-11-01 NOTE — Progress Notes (Signed)
Subjective: 76 year old female presents the office today for concerns of her nails becoming thickened discolored and elongated she cannot trim himself and causing discomfort.  In particular the right hallux nail has become somewhat thicker.  She said the ingrown toenails been doing well any significant pain.  She does not see any drainage or pus from the toenail sites.  She has no other concerns.  Objective: AAO x3, NAD DP/PT pulses palpable bilaterally, CRT less than 3 seconds Nails are hypertrophic, dystrophic, brittle, discolored, elongated 10.  Most notably the right hallux toenail is the most thickened.  Procedure site for the ingrown toenail is healed well.  No surrounding redness or drainage. Tenderness nails 1-5 bilaterally. No open lesions or pre-ulcerative lesions are identified today. No pain with calf compression, swelling, warmth, erythema  Assessment: Ingrown toenail, symptomatic onychomycosis  Plan: -All treatment options discussed with the patient including all alternatives, risks, complications.  -Sharply debrided nails x10 without any complications or bleeding.  Discussed urea nail gel to help with the thickening. -Patient encouraged to call the office with any questions, concerns, change in symptoms.   Trula Slade DPM

## 2021-11-02 ENCOUNTER — Ambulatory Visit
Admission: RE | Admit: 2021-11-02 | Discharge: 2021-11-02 | Disposition: A | Discharge status: Home / Self Care | Payer: Medicare Other | Source: Ambulatory Visit | Attending: Neurology | Admitting: Neurology

## 2021-11-02 ENCOUNTER — Telehealth: Payer: Self-pay | Admitting: Internal Medicine

## 2021-11-02 DIAGNOSIS — M5416 Radiculopathy, lumbar region: Secondary | ICD-10-CM

## 2021-11-02 NOTE — Telephone Encounter (Signed)
1) Levolthyroxine 112 mcg. 2.  Clobetasol ointment 0.05% 3.  Conjugated estrogens 0.623 mg cream 4.  Buspirone 5 mg 5.  Amlodipine 5 mg 6.  Lisinopril 5 mg. 7.  Simvastatin 20 mg 8.  Primidene 50 mg 9.  Propranolol 10 mg 10.  Advair HFA 230-21 mcg inhaler  Please send all to [facility] by mail

## 2021-11-03 ENCOUNTER — Encounter: Payer: Self-pay | Admitting: Internal Medicine

## 2021-11-03 ENCOUNTER — Encounter: Payer: Self-pay | Admitting: Neurology

## 2021-11-07 ENCOUNTER — Other Ambulatory Visit: Payer: Self-pay | Admitting: Internal Medicine

## 2021-11-07 ENCOUNTER — Encounter: Payer: Self-pay | Admitting: Gastroenterology

## 2021-11-07 MED ORDER — BUSPIRONE HCL 5 MG PO TABS: 5.0000 mg | ORAL_TABLET | Freq: Two times a day (BID) | ORAL | 3 refills | Status: DC

## 2021-11-07 MED ORDER — SIMVASTATIN 20 MG PO TABS: 20.0000 mg | ORAL_TABLET | Freq: Every day | ORAL | 3 refills | Status: DC

## 2021-11-07 MED ORDER — LISINOPRIL 5 MG PO TABS: 5.0000 mg | ORAL_TABLET | Freq: Every day | ORAL | 3 refills | Status: DC

## 2021-11-07 MED ORDER — AMLODIPINE BESYLATE 5 MG PO TABS: 5.0000 mg | ORAL_TABLET | Freq: Every day | ORAL | 3 refills | Status: DC

## 2021-11-07 MED ORDER — CLOBETASOL PROPIONATE 0.05 % EX OINT: TOPICAL_OINTMENT | CUTANEOUS | 3 refills | Status: DC

## 2021-11-07 MED ORDER — ESTROGENS CONJUGATED 0.625 MG/GM VA CREA: 1.0000 | TOPICAL_CREAM | Freq: Every day | VAGINAL | 1 refills | Status: DC

## 2021-11-07 NOTE — Telephone Encounter (Signed)
Called patient to offer an appointment to be seen for a referral to GI. Patient stated she already contacted the GI office and has scheduled an appointment with them.

## 2021-11-07 NOTE — Telephone Encounter (Signed)
Refills have been sent to the pt's mail order pharmacy.

## 2021-11-07 NOTE — Telephone Encounter (Signed)
Last fill for both medications: 02/28/21  LOV: 10/11/21

## 2021-11-08 NOTE — Telephone Encounter (Signed)
There are multiple sigs for both medications. Please call and verify dosing/sig on omeprazole and lyrica.

## 2021-11-16 ENCOUNTER — Encounter: Payer: Self-pay | Admitting: Internal Medicine

## 2021-11-17 MED ORDER — FLUCONAZOLE 150 MG PO TABS: 150.0000 mg | ORAL_TABLET | Freq: Once | ORAL | 0 refills | Status: AC

## 2021-12-04 ENCOUNTER — Other Ambulatory Visit: Payer: Medicare Other

## 2021-12-04 ENCOUNTER — Ambulatory Visit (INDEPENDENT_AMBULATORY_CARE_PROVIDER_SITE_OTHER): Payer: Medicare Other | Admitting: Gastroenterology

## 2021-12-04 ENCOUNTER — Other Ambulatory Visit: Payer: Self-pay

## 2021-12-04 ENCOUNTER — Ambulatory Visit (INDEPENDENT_AMBULATORY_CARE_PROVIDER_SITE_OTHER)
Admission: RE | Admit: 2021-12-04 | Discharge: 2021-12-04 | Disposition: A | Discharge status: Home / Self Care | Payer: Medicare Other | Source: Ambulatory Visit | Attending: Gastroenterology | Admitting: Gastroenterology

## 2021-12-04 ENCOUNTER — Encounter: Payer: Self-pay | Admitting: Gastroenterology

## 2021-12-04 VITALS — BP 124/80 | HR 66 | Ht 64.0 in | Wt 157.2 lb

## 2021-12-04 DIAGNOSIS — R152 Fecal urgency: Secondary | ICD-10-CM | POA: Diagnosis not present

## 2021-12-04 DIAGNOSIS — R197 Diarrhea, unspecified: Secondary | ICD-10-CM

## 2021-12-04 DIAGNOSIS — R159 Full incontinence of feces: Secondary | ICD-10-CM

## 2021-12-04 MED ORDER — NA SULFATE-K SULFATE-MG SULF 17.5-3.13-1.6 GM/177ML PO SOLN
1.0000 | Freq: Once | ORAL | 0 refills | Status: AC
Start: 2021-12-04 — End: 2021-12-04

## 2021-12-04 NOTE — Patient Instructions (Addendum)
It was my pleasure to provide care to you today. Based on our discussion, I am providing you with my recommendations below: ? ?RECOMMENDATION(S):  ? ?I have recommended an abdominal x-ray, stool studies, and a colonoscopy for further evaluation.  ? ?In the meantime, I would add a daily dose of Metamucil or Benefiber every day to bulk up the stools. ? ?Avoid sugars and caffeine.  ? ?Keep a food and symptom diary to identify causitive factors. ? ?Keep your bottom clean and dry without excessive wiping or using astringent cleaners. ? ?Apply a barrier cream such as zinc oxide to the perianal skin. ? ?Consider using incontinence pads to protect your skin and clothes from fecal soiling.   ? ?IMAGING: ? ?Your provider has requested that you have an abdominal x ray before leaving today. Please go to the basement floor to our Radiology department for the test. ? ?LABS:  ? ?Please proceed to the basement level for lab work before leaving today. Press "B" on the elevator. The lab is located at the first door on the left as you exit the elevator. ? ?COLONOSCOPY:  ? ?You have been scheduled for a colonoscopy. Please follow written instructions given to you at your visit today.  ? ?PREP:  ? ?Please pick up your prep supplies at the pharmacy within the next 1-3 days. ? ?INHALERS:  ? ?If you use inhalers (even only as needed), please bring them with you on the day of your procedure. ? ?COLONOSCOPY TIPS: ? ?To reduce nausea and dehydration, stay well hydrated for 3-4 days prior to the exam.  ?To prevent skin/hemorrhoid irritation - prior to wiping, put A&Dointment or vaseline on the toilet paper. ?Keep a towel or pad on the bed.  ?BEFORE STARTING YOUR PREP, drink  64oz of clear liquids in the morning. This will help to flush the colon and will ensure you are well hydrated!!!!  ?NOTE - This is in addition to the fluids required for to complete your prep. ?Use of a flavored hard candy, such as grape Anise Salvo, can counteract some  of the flavor of the prep and may prevent some nausea.  ? ?FOLLOW UP: ? ?After your procedure, you will receive a call from my office staff regarding my recommendation for follow up. ? ?BMI: ? ?If you are age 39 or older, your body mass index should be between 23-30. Your Body mass index is 26.98 kg/m?Marland Kitchen If this is out of the aforementioned range listed, please consider follow up with your Primary Care Provider. ? ?MY CHART: ? ?The [facility] providers would like to encourage you to use [facility] to communicate with providers for non-urgent requests or questions.  Due to long hold times on the telephone, sending your provider a message by [facility] may be a faster and more efficient way to get a response.  Please allow 48 business hours for a response.  Please remember that this is for non-urgent requests.  ? ?Thank you for trusting me with your gastrointestinal care!   ? ?Thornton Park, MD, MPH ? ?

## 2021-12-04 NOTE — Progress Notes (Signed)
Referring Provider: Myrlene Broker, * Primary Care Physician:  Myrlene Broker, MD   Reason for Consultation:  Diarrhea   IMPRESSION:  Diarrhea with fecal incontinence -  No improvement despite pelvic floor PT.  Must consider overflow incontinence, carbohydrate intolerance, bacterial overgrowth, diet, medications, bile salt malabsorption, autonomic dysfunction, IBS, microscopic colitis, and IBD. Colonoscopy with random colon biopsies recommended. Psyllium to add stool bulk. Loperamide to treat the diarrhea. If loperamide is not working, will add a bile acid binder.  Will proceed with anorectal manometry +/- endoscopic ultrasound +/- pelvic floor MRI given the poor response to medical therapy  History of tortuous colon requiring hospitalized colonoscopy under anesthesia at [facility] 2014  Rectal prolapse with history of pelvic floor physical therapy  PLAN: - Add a daily dose or Metamucil or Benefiber - Fecal calprotectin, pancreatic elastase, and GI pathogen panel - Abdominal x-ray - Colonoscopy with random biopsies - See patient instructions for additional recommendations   HPI: Mariah Snyder is a 76 y.o. female preferred by Dr. Okey Dupre for evaluation of diarrhea with associated abdominal discomfort. The history is obtained through the patient and review of her electronic health record. She has a history of rectal prolapse with prior physical therapy.  History is also remarkable for anemia, anxiety, asthma, fibromyalgia, reflux, hypercholesterolemia, hypertension, nephrolithiasis, sleep apnea.  She had a cholecystectomy for gallstones in 2012.  Recently relocated to [facility] from [facility] to be closer to family including her 66 year old grandmother.  Loose stools with urgency started in 2021. Did not have trouble with control until more recently. Some mucous. No blood in the stool. Some associated lower abdominal discomfort. She has concurrent urinary incontinence. Seen by  Urogynecology at [facility] 10/2020 for overactive bladder having failed Vesicare and Detrol. She completed physical therapy at [facility] for both last year but this did not help with the fecal incontinence.   Frequently feels constipated but she has had two bowel movements. No sense of complete evacuation. Significant straining. Has previously tried fiber, Metamucil, stool softeners, Miralax with incomplete relieve of symptoms.   Having frequent accidents including while singing during a recent choral concert.   She stopped eating greens because this triggers the episodes.   Seen by neurology. MRI/MRA of the head negative.   She had a colonoscopy at [facility] that had to be rescheduled to the hospital under anesthesia due to a tortuous colon. Colonoscopy 2014 at [facility] with Dr. Council Mechanic showed sigmoid diverticulosis and was otherwise normal. She had the procedure performed at the hospital because she failed conscious sedation. Surveillance recommended in 10 years.   Symptoms were not present at the time of her colonoscopy.   There is no known family history of colon cancer or polyps. No family history of stomach cancer or other GI malignancy. No family history of inflammatory bowel disease or celiac.    Past Medical History:  Diagnosis Date   Anemia    Anxiety    Arthritis    Asthma    Fibromyalgia    Gallstones    GERD (gastroesophageal reflux disease)    Hyperlipidemia    Hypertension    Hypothyroidism    Kidney stones    Peptic ulcer    Rectal prolapse    Sleep apnea    Urinary tract infection     Past Surgical History:  Procedure Laterality Date   bunyionectomy Left    CHOLECYSTECTOMY     ELBOW ARTHROSCOPY Left    LAPAROSCOPIC ASSISTED VAGINAL HYSTERECTOMY  SHOULDER ARTHROSCOPY Right    TONSILLECTOMY       Current Outpatient Medications  Medication Sig Dispense Refill   acetaminophen (TYLENOL) 325 MG tablet Take 650 mg by mouth every 6 (six) hours as needed.     amLODipine  (NORVASC) 5 MG tablet Take 1 tablet (5 mg total) by mouth daily. 90 tablet 3   busPIRone (BUSPAR) 5 MG tablet Take 1 tablet (5 mg total) by mouth 2 (two) times daily. Take one tablet in the morning and 1 tablet at bedtime 180 tablet 3   Carboxymethylcell-Hypromellose (GENTEAL) 0.25-0.3 % GEL Apply to eye. Apply to each eye at bedtime     carboxymethylcellul-glycerin (REFRESH OPTIVE) 0.5-0.9 % ophthalmic solution Apply to eye.     Cholecalciferol (VITAMIN D3) 50 MCG (2000 UT) capsule Take by mouth. Take one capsule daily     clobetasol ointment (TEMOVATE) 0.05 % Apply to affected area twice a week as directed 90 g 3   conjugated estrogens (PREMARIN) vaginal cream Place vaginally 2 (two) times a week. Apply 0.5g into vagina 2 x per week 90 g 3   fluticasone-salmeterol (ADVAIR HFA) 230-21 MCG/ACT inhaler Inhale 2 puffs into the lungs 2 (two) times daily. (Patient taking differently: Inhale 2 puffs into the lungs 2 (two) times daily. 2 puffs in the morning and 2 puffs in the evening.) 3 each 3   levothyroxine (SYNTHROID) 112 MCG tablet Take 1 tablet (112 mcg total) by mouth daily. 90 tablet 1   lisinopril (ZESTRIL) 5 MG tablet Take 1 tablet (5 mg total) by mouth daily. 90 tablet 3   Omega-3 Fatty Acids (FISH OIL) 1000 MG CAPS Take 1 capsule by mouth daily.     omeprazole (PRILOSEC) 20 MG capsule Take 20 mg by mouth daily. Take two capsules daily     prednisoLONE acetate (PRED FORTE) 1 % ophthalmic suspension Apply to eye.     pregabalin (LYRICA) 75 MG capsule Take 75 mg by mouth. Take 2-3 capsules daily for nerve pain     primidone (MYSOLINE) 50 MG tablet Take 3 tablets (150 mg total) by mouth 2 (two) times daily. 540 tablet 1   propranolol (INDERAL) 10 MG tablet Take 1 tablet (10 mg total) by mouth 2 (two) times daily. 180 tablet 1   simvastatin (ZOCOR) 20 MG tablet Take 1 tablet (20 mg total) by mouth daily. 90 tablet 3   No current facility-administered medications for this visit.    Allergies as  of 12/04/2021 - Review Complete 12/04/2021  Allergen Reaction Noted   Sulfa antibiotics Other (See Comments) 04/07/2013   Grass extracts [gramineae pollens] Itching and Other (See Comments) 07/23/2012   Molds & smuts Itching and Other (See Comments) 07/23/2012    Family History  Problem Relation Age of Onset   Asthma Mother    Tremor Mother    Aneurysm Father        AAA   Heart attack Brother        Died from Heart Attack at age 62   Heart disease Brother    Other Brother        Post Polio Syndrome   Heart disease Paternal Grandmother    Tremor Child    Colon cancer Neg Hx    Esophageal cancer Neg Hx    Pancreatic cancer Neg Hx    Colon polyps Neg Hx     Social History   Socioeconomic History   Marital status: Widowed    Spouse name: Not on file  Number of children: 2   Years of education: Not on file   Highest education level: Not on file  Occupational History   Occupation: retired    Comment: worked in radiology  Tobacco Use   Smoking status: Former    Types: Cigarettes   Smokeless tobacco: Never  Building services engineer Use: Never used  Substance and Sexual Activity   Alcohol use: Yes    Comment: Has a beer or glass of wine occasionally   Drug use: Never   Sexual activity: Not on file  Other Topics Concern   Not on file  Social History Narrative   Right Handed    Lives in a two story home   Social Determinants of Health   Financial Resource Strain: Not on file  Food Insecurity: Not on file  Transportation Needs: Not on file  Physical Activity: Not on file  Stress: Not on file  Social Connections: Not on file  Intimate Partner Violence: Not on file    Review of Systems: 12 system ROS is negative except as noted above with the addition of allergies, anxiety, arthritis, back pain, vision changes, muscle pains, shortness of breath, insomnia, urinary incontinence.   Physical Exam: General:   Alert,  well-nourished, pleasant and cooperative in NAD Head:   Normocephalic and atraumatic. Eyes:  Sclera clear, no icterus.   Conjunctiva pink. Ears:  Normal auditory acuity. Nose:  No deformity, discharge,  or lesions. Mouth:  No deformity or lesions.   Neck:  Supple; no masses or thyromegaly. Lungs:  Clear throughout to auscultation.   No wheezes. Heart:  Regular rate and rhythm; no murmurs. Abdomen:  Soft, nontender, nondistended, normal bowel sounds, no rebound or guarding. No hepatosplenomegaly.   Rectal:  Deferred  Msk:  Symmetrical. No boney deformities LAD: No inguinal or umbilical LAD Extremities:  No clubbing or edema. Neurologic:  Alert and  oriented x4;  grossly nonfocal Skin:  Intact without significant lesions or rashes. Psych:  Alert and cooperative. Normal mood and affect.  I spent over 45 minutes, including in depth chart review, independent review of results, communicating results with the patient directly, face-to-face time with the patient, coordinating care, ordering studies and medications as appropriate, and documentation.     Hayly Litsey L. Orvan Falconer, MD, MPH 12/04/2021, 10:42 AM

## 2021-12-08 ENCOUNTER — Other Ambulatory Visit: Payer: Medicare Other

## 2021-12-08 DIAGNOSIS — R152 Fecal urgency: Secondary | ICD-10-CM

## 2021-12-08 DIAGNOSIS — R197 Diarrhea, unspecified: Secondary | ICD-10-CM

## 2021-12-12 ENCOUNTER — Telehealth: Payer: Self-pay

## 2021-12-12 LAB — GI PROFILE, STOOL, PCR

## 2021-12-12 LAB — CALPROTECTIN, FECAL: Calprotectin, Fecal: <16 ug/g (ref 0–120)

## 2021-12-12 NOTE — Telephone Encounter (Signed)
Called pt and informed of results and recommendations as reviewed and documented by Dr. Tarri Glenn. Verbalized acceptance and understanding. ? ?

## 2021-12-12 NOTE — Telephone Encounter (Signed)
Call received from [employer] with following alert: ? ?Norovirus GI/GII  Detected Abnormal    ?Comment:                   Client Requested Flag  ?Routing to Dr. Tarri Glenn for her to review and address ?

## 2021-12-14 LAB — PANCREATIC ELASTASE, FECAL: Pancreatic Elastase-1, Stool: >500 mcg/g

## 2021-12-19 ENCOUNTER — Encounter: Payer: Self-pay | Admitting: Gastroenterology

## 2021-12-19 ENCOUNTER — Ambulatory Visit (AMBULATORY_SURGERY_CENTER): Payer: Medicare Other | Admitting: Gastroenterology

## 2021-12-19 VITALS — BP 115/67 | HR 73 | Temp 96.6°F | Resp 21 | Ht 64.0 in | Wt 157.0 lb

## 2021-12-19 DIAGNOSIS — D122 Benign neoplasm of ascending colon: Secondary | ICD-10-CM | POA: Diagnosis not present

## 2021-12-19 DIAGNOSIS — K5730 Diverticulosis of large intestine without perforation or abscess without bleeding: Secondary | ICD-10-CM | POA: Diagnosis not present

## 2021-12-19 DIAGNOSIS — K633 Ulcer of intestine: Secondary | ICD-10-CM | POA: Diagnosis not present

## 2021-12-19 DIAGNOSIS — D123 Benign neoplasm of transverse colon: Secondary | ICD-10-CM

## 2021-12-19 DIAGNOSIS — K529 Noninfective gastroenteritis and colitis, unspecified: Secondary | ICD-10-CM | POA: Diagnosis not present

## 2021-12-19 DIAGNOSIS — R152 Fecal urgency: Secondary | ICD-10-CM

## 2021-12-19 DIAGNOSIS — D125 Benign neoplasm of sigmoid colon: Secondary | ICD-10-CM

## 2021-12-19 DIAGNOSIS — R197 Diarrhea, unspecified: Secondary | ICD-10-CM

## 2021-12-19 MED ORDER — AMOXICILLIN-POT CLAVULANATE 875-125 MG PO TABS: 1.0000 | ORAL_TABLET | Freq: Two times a day (BID) | ORAL | 0 refills | Status: AC

## 2021-12-19 MED ORDER — SODIUM CHLORIDE 0.9 % IV SOLN: 500.0000 mL | Freq: Once | INTRAVENOUS | Status: DC

## 2021-12-19 MED ORDER — FLUCONAZOLE 150 MG PO TABS: 150.0000 mg | ORAL_TABLET | Freq: Every day | ORAL | 0 refills | Status: AC

## 2021-12-19 NOTE — Progress Notes (Signed)
Upon discharge patient complain of chest discomfort when taking a breathe. Antibiotic called in to pharmacy for preventive measures  patient encourage to cough and deep breathe and drink plenty of fluids.  ?

## 2021-12-19 NOTE — Progress Notes (Signed)
Pt's states no medical or surgical changes since previsit or office visit. 

## 2021-12-19 NOTE — Patient Instructions (Signed)
Discharge instructions given. ?Handouts on polyps and diverticulosis. ?Biopsies taken. ?Office will call to schedule follow up. ?Resume previous medications. ?YOU HAD AN ENDOSCOPIC PROCEDURE TODAY AT [facility]:   Refer to the procedure report that was given to you for any specific questions about what was found during the examination.  If the procedure report does not answer your questions, please call your gastroenterologist to clarify.  If you requested that your care partner not be given the details of your procedure findings, then the procedure report has been included in a sealed envelope for you to review at your convenience later. ? ?YOU SHOULD EXPECT: Some feelings of bloating in the abdomen. Passage of more gas than usual.  Walking can help get rid of the air that was put into your GI tract during the procedure and reduce the bloating. If you had a lower endoscopy (such as a colonoscopy or flexible sigmoidoscopy) you may notice spotting of blood in your stool or on the toilet paper. If you underwent a bowel prep for your procedure, you may not have a normal bowel movement for a few days. ? ?Please Note:  You might notice some irritation and congestion in your nose or some drainage.  This is from the oxygen used during your procedure.  There is no need for concern and it should clear up in a day or so. ? ?SYMPTOMS TO REPORT IMMEDIATELY: ? ?Following lower endoscopy (colonoscopy or flexible sigmoidoscopy): ? Excessive amounts of blood in the stool ? Significant tenderness or worsening of abdominal pains ? Swelling of the abdomen that is new, acute ? Fever of 100?F or higher ? ? ?For urgent or emergent issues, a gastroenterologist can be reached at any hour by calling [phone]. ?Do not use MyChart messaging for urgent concerns.  ? ? ?DIET:  We do recommend a small meal at first, but then you may proceed to your regular diet.  Drink plenty of fluids but you should avoid alcoholic  beverages for 24 hours. ? ?ACTIVITY:  You should plan to take it easy for the rest of today and you should NOT DRIVE or use heavy machinery until tomorrow (because of the sedation medicines used during the test).   ? ?FOLLOW UP: ?Our staff will call the number listed on your records 48-72 hours following your procedure to check on you and address any questions or concerns that you may have regarding the information given to you following your procedure. If we do not reach you, we will leave a message.  We will attempt to reach you two times.  During this call, we will ask if you have developed any symptoms of COVID 19. If you develop any symptoms (ie: fever, flu-like symptoms, shortness of breath, cough etc.) before then, please call [phone].  If you test positive for Covid 19 in the 2 weeks post procedure, please call and report this information to [place].   ? ?If any biopsies were taken you will be contacted by phone or by letter within the next 1-3 weeks.  Please call us at [phone] if you have not heard about the biopsies in 3 weeks.  ? ? ?SIGNATURES/CONFIDENTIALITY: ?You and/or your care partner have signed paperwork which will be entered into your electronic medical record.  These signatures attest to the fact that that the information above on your After Visit Summary has been reviewed and is understood.  Full responsibility of the confidentiality of this discharge information lies with you and/or your  care-partner.  ?

## 2021-12-19 NOTE — Progress Notes (Signed)
PT taken to PACU. Monitors in place. VSS. Report given to RN. Pt informed that she vomited during the procedure. The possibility of aspiration was discussed, and she was informed to seek medical attention should any changes occur.  ?

## 2021-12-19 NOTE — Op Note (Addendum)
[facility] ?Patient Name: Mariah Snyder ?Procedure Date: 12/19/2021 10:14 AM ?MRN: [id number] ?Endoscopist: Thornton Park MD, MD ?Age: 76 ?Referring MD:  ?Date of Birth: 01-Feb-1946 ?Gender: Female ?Account #: [id number] ?Procedure:                Colonoscopy ?Indications:              Chronic diarrhea ?Medicines:                Monitored Anesthesia Care ?Procedure:                Pre-Anesthesia Assessment: ?                          - Prior to the procedure, a History and Physical  ?                          was performed, and patient medications and  ?                          allergies were reviewed. The patient's tolerance of  ?                          previous anesthesia was also reviewed. The risks  ?                          and benefits of the procedure and the sedation  ?                          options and risks were discussed with the patient.  ?                          All questions were answered, and informed consent  ?                          was obtained. Prior Anticoagulants: The patient has  ?                          taken no previous anticoagulant or antiplatelet  ?                          agents. ASA Grade Assessment: II - A patient with  ?                          mild systemic disease. After reviewing the risks  ?                          and benefits, the patient was deemed in  ?                          satisfactory condition to undergo the procedure. ?                          After obtaining informed consent, the colonoscope  ?  was passed under direct vision. Throughout the  ?                          procedure, the patient's blood pressure, pulse, and  ?                          oxygen saturations were monitored continuously. The  ?                          Olympus PCF-H190DL (#[id number]) Colonoscope was  ?                          introduced through the anus and advanced to the 3  ?                          cm into the ileum. A second forward view  of the  ?                          right colon was performed. The colonoscopy was  ?                          technically difficult and complex due to restricted  ?                          mobility of the colon, significant looping and a  ?                          tortuous colon. Successful completion of the  ?                          procedure was aided by changing the patient's  ?                          position, withdrawing the scope and replacing with  ?                          the pediatric colonoscope and applying abdominal  ?                          pressure. ?Scope In: 10:21:32 AM ?Scope Out: 63:14:97 AM ?Scope Withdrawal Time: 0 hours 14 minutes 7 seconds  ?Total Procedure Duration: 0 hours 24 minutes 46 seconds  ?Findings:                 The perianal and digital rectal examinations were  ?                          normal. ?                          Multiple small and large-mouthed diverticula were  ?                          found in the sigmoid colon and descending colon.  ?  The colon is fixed in this area. ?                          A single (solitary) three mm ulcer was found in the  ?                          sigmoid colon. The remainder of the examined  ?                          colonic mucosa appeared normal. No bleeding was  ?                          present. Biopsies were taken from the ulcer and  ?                          throughout the colon for further evaluation with a  ?                          cold forceps for histology. Estimated blood loss  ?                          was minimal however some oozing did occur. ?                          A 3 mm polyp was found in the sigmoid colon. The  ?                          polyp was sessile. The polyp was removed with a  ?                          cold snare. Resection and retrieval were complete.  ?                          Estimated blood loss was minimal. ?                          A 6 mm polyp was found in the  hepatic flexure. The  ?                          polyp was sessile. The polyp was removed with a  ?                          cold snare. Resection and retrieval were complete.  ?                          Estimated blood loss was minimal. ?                          A 3 mm polyp was found in the ascending colon. The  ?                          polyp was sessile. The polyp was removed with  a  ?                          cold snare. Resection and retrieval were complete. ?                          The colon is tortuous and redundant. The exam was  ?                          otherwise without abnormality on direct and  ?                          retroflexion views. ?Complications:            Vomited during the procedure. No witnessed  ?                          aspiration. Zofran '4mg'$  IV given during the procedure ?Estimated Blood Loss:     Estimated blood loss was minimal. Oozing occured  ?                          with cold forcep biopsies but not with the  ?                          polypectomies. ?Impression:               - Diverticulosis in the sigmoid colon and in the  ?                          descending colon. ?                          - A very small ulcer was seen in sigmoid colon. The  ?                          colon was otherwise normal. Biopsied. ?                          - One 3 mm polyp in the sigmoid colon, removed with  ?                          a cold snare. Resected and retrieved. ?                          - One 6 mm polyp at the hepatic flexure, removed  ?                          with a cold snare. Resected and retrieved. ?                          - One 3 mm polyp in the ascending colon, removed  ?                          with a cold snare. Resected and retrieved. ?                          -  The examination was otherwise normal on direct  ?                          and retroflexion views. ?Recommendation:           - Patient has a contact number available for  ?                           emergencies. The signs and symptoms of potential  ?                          delayed complications were discussed with the  ?                          patient. Return to normal activities tomorrow.  ?                          Written discharge instructions were provided to the  ?                          patient. ?                          - High fiber diet. Use a daily dose of Metamucil or  ?                          Benefiber. ?                          - Continue present medications. ?                          - Given concerns for possible aspiration, Augmentin  ?                          875 mg BID x 7 days. At her request, given  ?                          fluconazole 150 mg x 1 to use if a yeast infection  ?                          occurs related to antibiotics treatment. ?                          - Await pathology results. ?                          - Repeat colonoscopy is not recommended due to  ?                          current age routine colon cancer surveillance. ?                          - Emerging evidence supports eating a diet of  ?  fruits, vegetables, grains, calcium, and yogurt  ?                          while reducing red meat and alcohol may reduce the  ?                          risk of colon cancer. ?                          - Office follow-up to review these results ?Thornton Park MD, MD ?12/19/2021 10:58:51 AM ?This report has been signed electronically. ?

## 2021-12-19 NOTE — Progress Notes (Signed)
Indication for colonoscopy: Diarrhea with fecal incontinence ? ?Please see the office visit from 12/04/2021 for full details.  There is been no change in history of physical exam.  The patient remains an appropriate candidate for monitored anesthesia care in the endoscopy center. ?

## 2021-12-19 NOTE — Progress Notes (Signed)
Called to room to assist during endoscopic procedure.  Patient ID and intended procedure confirmed with present staff. Received instructions for my participation in the procedure from the performing physician.  

## 2021-12-19 NOTE — Progress Notes (Signed)
Pt vomited a small amount of bile colored fluid during procedure.Tech was applying abdominal pressure, and stopped immediately. Oral suctioning performed. Zofran given. No dental or soft tissue damage noted.  ? ? ?

## 2021-12-21 ENCOUNTER — Ambulatory Visit: Payer: Medicare Other | Admitting: Family Medicine

## 2021-12-21 ENCOUNTER — Encounter: Payer: Self-pay | Admitting: Internal Medicine

## 2021-12-21 ENCOUNTER — Telehealth: Payer: Self-pay | Admitting: Pulmonary Disease

## 2021-12-21 ENCOUNTER — Ambulatory Visit (INDEPENDENT_AMBULATORY_CARE_PROVIDER_SITE_OTHER): Payer: Medicare Other

## 2021-12-21 ENCOUNTER — Telehealth: Payer: Self-pay

## 2021-12-21 ENCOUNTER — Ambulatory Visit (INDEPENDENT_AMBULATORY_CARE_PROVIDER_SITE_OTHER): Payer: Medicare Other | Admitting: Internal Medicine

## 2021-12-21 VITALS — BP 118/70 | HR 76 | Resp 18 | Ht 64.0 in | Wt 156.4 lb

## 2021-12-21 DIAGNOSIS — J4540 Moderate persistent asthma, uncomplicated: Secondary | ICD-10-CM | POA: Diagnosis not present

## 2021-12-21 DIAGNOSIS — J690 Pneumonitis due to inhalation of food and vomit: Secondary | ICD-10-CM | POA: Insufficient documentation

## 2021-12-21 MED ORDER — ALBUTEROL SULFATE (2.5 MG/3ML) 0.083% IN NEBU: 2.5000 mg | INHALATION_SOLUTION | Freq: Four times a day (QID) | RESPIRATORY_TRACT | 1 refills | Status: DC | PRN

## 2021-12-21 NOTE — Assessment & Plan Note (Signed)
Minimal rhonchi on exam and no SOB. No hypoxia on exam today. Checking CXR but likely chemical pneumonitis. Given that she is having worsening diarrhea from augmentin and she has taken 3 days if CXR consistent with aspiration we will stop augmentin. Advised to continue deep breathing. Rx nebulizer to help better administer albuterol rx albuterol solution as well. We talked about how symptoms would typically resolve within 1-2 weeks. She is gradually improving at day 3-4 today.  ?

## 2021-12-21 NOTE — Telephone Encounter (Signed)
Please have her start the antibiotic, she has left lung infiltrates. If she continues to cough up small amounts of blood then she is to call our office back for a visit. She may need a short course of steroids.  ? ?If the bleeding worsens, she should go to the ER. ? ?Thanks, ?JD ?

## 2021-12-21 NOTE — Progress Notes (Signed)
? ?  Subjective:  ? ?Patient ID: Mariah Snyder, female    DOB: Jun 29, 1946, 76 y.o.   MRN: [id number] ? ?HPI ?The patient is a 76 YO female coming in for SOB and chest pain s/p aspiration during colonoscopy. Burning sensation in chest immediately afterwards with vomiting small amount noted during procedure. She was placed on augmentin preventatively by GI.  ? ?Review of Systems  ?Constitutional: Negative.   ?HENT: Negative.    ?Eyes: Negative.   ?Respiratory:  Positive for cough and shortness of breath. Negative for chest tightness.   ?Cardiovascular:  Positive for chest pain. Negative for palpitations and leg swelling.  ?Gastrointestinal:  Positive for abdominal pain and diarrhea. Negative for abdominal distention, constipation, nausea and vomiting.  ?Musculoskeletal: Negative.   ?Skin: Negative.   ?Neurological: Negative.   ?Psychiatric/Behavioral: Negative.    ? ?Objective:  ?Physical Exam ?Constitutional:   ?   Appearance: She is well-developed.  ?HENT:  ?   Head: Normocephalic and atraumatic.  ?Cardiovascular:  ?   Rate and Rhythm: Normal rate and regular rhythm.  ?Pulmonary:  ?   Effort: Pulmonary effort is normal. No respiratory distress.  ?   Breath sounds: Rhonchi present. No wheezing or rales.  ?Abdominal:  ?   General: Bowel sounds are normal. There is no distension.  ?   Palpations: Abdomen is soft.  ?   Tenderness: There is no abdominal tenderness. There is no rebound.  ?Musculoskeletal:  ?   Cervical back: Normal range of motion.  ?Skin: ?   General: Skin is warm and dry.  ?Neurological:  ?   Mental Status: She is alert and oriented to person, place, and time.  ?   Coordination: Coordination normal.  ? ? ?Vitals:  ? 12/21/21 1112  ?BP: 118/70  ?Pulse: 76  ?Resp: 18  ?SpO2: 96%  ?Weight: 156 lb 6.4 oz (70.9 kg)  ?Height: '5\' 4"'$  (1.626 m)  ? ? ?This visit occurred during the SARS-CoV-2 public health emergency.  Safety protocols were in place, including screening questions prior to the visit, additional usage  of staff PPE, and extensive cleaning of exam room while observing appropriate contact time as indicated for disinfecting solutions.  ? ?Assessment & Plan:  ?Visit time 20 minutes in face to face communication with patient and coordination of care, additional 10 minutes spent in record review, coordination or care, ordering tests, communicating/referring to other healthcare professionals, documenting in medical records all on the same day of the visit for total time 30 minutes spent on the visit.  ? ?

## 2021-12-21 NOTE — Telephone Encounter (Signed)
I called and spoke with the pt and notified of response per Dr Erin Fulling. She verbalized understanding. She will call for ov if not improving.  ?

## 2021-12-21 NOTE — Assessment & Plan Note (Signed)
Rx for nebulizer given to patient and rx albuterol solution to her pharmacy for better usage given current aspiration pneumonia. Continue advair BID and albuterol either inhaler or nebulized q 6 hr prn.  ?

## 2021-12-21 NOTE — Telephone Encounter (Signed)
?  Follow up Call- ? ? ?  12/19/2021  ?  9:06 AM  ?Call back number  ?Post procedure Call Back phone  # [phone]  ?Permission to leave phone message Yes  ?  ? ?Patient questions: ? ?Do you have a fever, pain , or abdominal swelling? No. ?Pain Score  0 * ? ?Have you tolerated food without any problems? Yes.   ? ?Have you been able to return to your normal activities? Yes.   ? ?Do you have any questions about your discharge instructions: ?Diet   No. ?Medications  No. ?Follow up visit  No. ? ?Do you have questions or concerns about your Care? Yes.   ? ?Actions: ?* If pain score is 4 or above: ?No action needed, pain <4. ?Pt states still having diarrhea but is most concerned about wheezing and cough following aspiration during the procedure. Suggested calling her PCP and/or pulmonologist due to her history of respiratory issues. ?Dr. Tarri Glenn please advise is there anything else you would like the patient to do. ? ? ?

## 2021-12-21 NOTE — Telephone Encounter (Signed)
Spoke with the pt and notified of response per Dr Erin Fulling. She says since she called [place] earlier she was set up to see Dr Sharlet Salina, her PCP and also cxr was done (in epic).  She was prescribed albuterol neb solution but no neb machine. She says they told her to hold off on abx until her cxr was back. Dr Erin Fulling, do you want to review this and let us know what to tell her to do? Thanks! ?

## 2021-12-21 NOTE — Telephone Encounter (Signed)
She should continue augmentin for a 7 day course. It may take a couple days for her to notice improvement in coughing up blood. I would recommend she come in for an x-ray today so I can review it. Further recommendations after the x-ray.  ? ?If symptoms progress, then she should be seen in an ER. ? ?Thanks, ?JD ?

## 2021-12-21 NOTE — Patient Instructions (Addendum)
We will check the chest x-ray today. ? ?This will likely heal on its own and we will advise you once the x-ray is back if you can stop the antibiotics. ? ?We have given you the prescription for the nebulizer and the albuterol medicine for it to the pharmacy. ?

## 2021-12-21 NOTE — Telephone Encounter (Signed)
Called and spoke with patient. She stated that she had a colonoscopy on 04/04 with [facility]. She was told that she aspirated during the procedure. When she woke up after the procedure, she felt a burning sensation in her chest. She was prescribed augmentin which she has been taking since 04/05.  ? ?She is now coughing up small amounts of bright red blood. She has also noticed an increase in wheezing. She is still using the Advair inhaler as prescribed.  ? ?She called GI but they told her to call pulmonary instead. She has not had a recent CXR. I attempted to get her scheduled with one of the APPS but there are no openings.  ? ?JD, can you please advise? Thanks!  ?

## 2021-12-22 ENCOUNTER — Telehealth: Payer: Self-pay | Admitting: Pulmonary Disease

## 2021-12-22 NOTE — Telephone Encounter (Signed)
See encounter from 4/6. States she is feeling better today. Improved wheezing. Still coughing, but the pheglm is less and it's only brown and no longer tinged with blood. Still very weak but she has no appetite.Did not want to come in for an appt. 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 ?

## 2022-01-03 ENCOUNTER — Ambulatory Visit (INDEPENDENT_AMBULATORY_CARE_PROVIDER_SITE_OTHER): Payer: Medicare Other | Admitting: Pulmonary Disease

## 2022-01-03 ENCOUNTER — Encounter: Payer: Self-pay | Admitting: Pulmonary Disease

## 2022-01-03 ENCOUNTER — Other Ambulatory Visit: Payer: Self-pay

## 2022-01-03 VITALS — BP 118/72 | HR 66 | Ht 64.0 in | Wt 157.8 lb

## 2022-01-03 DIAGNOSIS — J690 Pneumonitis due to inhalation of food and vomit: Secondary | ICD-10-CM | POA: Diagnosis not present

## 2022-01-03 DIAGNOSIS — J4540 Moderate persistent asthma, uncomplicated: Secondary | ICD-10-CM

## 2022-01-03 DIAGNOSIS — R197 Diarrhea, unspecified: Secondary | ICD-10-CM

## 2022-01-03 MED ORDER — RIFAXIMIN 550 MG PO TABS: 550.0000 mg | ORAL_TABLET | Freq: Three times a day (TID) | ORAL | 0 refills | Status: DC

## 2022-01-03 NOTE — Patient Instructions (Signed)
Continue advair inhaler 2 puffs twice daily ? ?Use albuterol nebulizer twice daily once you receive nebulizer machine for the first week or two, twice daily. Then after that you can use as needed for wheezing, cough and mucous production.  ? ?Continue walk throughout the week as this will help you take good deep breaths as you recover from the pneumonia.  ? ?Please call if symptoms worsen or do not continue to get better.  ? ?Follow up in 6 months  ?

## 2022-01-03 NOTE — Progress Notes (Signed)
? ?Synopsis: Referred in January 2022 for shortness of breath ? ?Subjective:  ? ?PATIENT ID: Mariah Snyder GENDER: female DOB: [birth year], MRN: [id number] ? ?HPI ? ?Chief Complaint  ?Patient presents with  ? Follow-up  ?  4 mo f/u for asthma. Had a bad reaction during colonoscopy a few weeks ago. Increased SOB especially with exertion.   ? ?Mariah Snyder is a 76 year old woman, former smoker with asthma and GERD who returns to pulmonary clinic for asthma follow up.  ? ?She had c-scope on 4/4 and noted to have aspiration event during the procedure. She called our clinic 4/6 reporting productive cough with blood tinged sputum. She was treated with augmentin for 7 days. Chest radiograph showed left lower lobe infiltrates. ? ?She continues to have cough with yellowish sputum and no reports of blood tinged sputum.  ? ?Her breathing prior to the procedure was getting better as she was walking 2 miles per day multiple times per week.  ? ?She is using advair 2 puffs twice daily.  ? ?OV 07/19/21 ?She reports her shortness of breath is better since last visit with increasing her advair inhaler to 230-75mg 2 puffs twice daily at last visit. She continues to have some dyspnea and wheezing with exertion on walking uphill or on an incline.  ? ?She denies any changes with her breathing due to the cold weather. ? ?OV 03/13/21 ?She has had an increase in shortness of breath since last visit with activity.  She stopped using Fluticasone nasal spray as this gave her nausea which led to vomiting.  She is now using simple saline nasal spray.  She denies any further sinus drainage issues. ? ?OV 10/2020: ?She had a dentist appointment who reported she had concerning findings for thrush with white patches and redness at the right posterior area of her throat. There was concern that this was related to the spiriva inhaler she was recently started on. She had an appointment with BWyn Quaker NP on 11/01/20 for evaluation of potential medication  reaction.  ? ?Prior to her dentist raising concern for thrush, she had no issues of sore throat or oral irritation. She has been on advair for many years and consistently rinses her mouth out and has not previously had issues with thrush.  ? ?The spiriva inhaler was stopped and she was treated with 2-3 days of nystatin.  ? ?She has continued on the advair and denies any issues with her breathing currently. She is not experiencing the wheezing she previously complained of.  ? ?She complains of cough and post-nasal sinus drainage. The cough is worse in the mornings with a whitish sputum.  ? ?OV 10/10/20 ?She reports having progressive shortness of breath and wheezing since September. She notices these symptoms when she walks. She reports being diagnosed with asthma in 2014 when she initially developed the shortness of breath and wheezing. She has been on advair HFA since then with adquate control of her symptoms.  ? ?She tested positive for covid on 10/03/20 which led to fatigue and worsening of her shortness of breath. She also reports sinus congestion and post nasal drip since covid.  ? ?Her asthma triggers include seasonal allergies worse in the fall and spring. Strong perfumes, colonges and cigarette smoke also make her breathing worse. She has not required prednisone or antibiotics in the past year.  ? ?She has positional sleep apnea based on previous sleep testing and trys to avoid sleeping on her back.  ? ?She was  exposed to second hand smoke growing up as her father smoked in the home. She also smoked socially in the past having cigarettes when drinking.  ? ?Her mother and youngest brother have asthma.  ? ?Past Medical History:  ?Diagnosis Date  ? Anemia   ? Anxiety   ? Arthritis   ? Asthma   ? Fibromyalgia   ? Gallstones   ? GERD (gastroesophageal reflux disease)   ? Hyperlipidemia   ? Hypertension   ? Hypothyroidism   ? Kidney stones   ? Peptic ulcer   ? Rectal prolapse   ? Sleep apnea   ? Urinary tract  infection   ?  ? ?Family History  ?Problem Relation Age of Onset  ? Asthma Mother   ? Tremor Mother   ? Aneurysm Father   ?     AAA  ? Heart attack Brother   ?     Died from Heart Attack at age 35  ? Heart disease Brother   ? Other Brother   ?     Post Polio Syndrome  ? Heart disease Paternal Grandmother   ? Tremor Child   ? Colon cancer Neg Hx   ? Esophageal cancer Neg Hx   ? Pancreatic cancer Neg Hx   ? Colon polyps Neg Hx   ?  ? ?Social History  ? ?Socioeconomic History  ? Marital status: Widowed  ?  Spouse name: Not on file  ? Number of children: 2  ? Years of education: Not on file  ? Highest education level: Not on file  ?Occupational History  ? Occupation: retired  ?  Comment: worked in radiology  ?Tobacco Use  ? Smoking status: Former  ?  Types: Cigarettes  ? Smokeless tobacco: Never  ?Vaping Use  ? Vaping Use: Never used  ?Substance and Sexual Activity  ? Alcohol use: Yes  ?  Comment: Has a beer or glass of wine occasionally  ? Drug use: Never  ? Sexual activity: Not on file  ?Other Topics Concern  ? Not on file  ?Social History Narrative  ? Right Handed   ? Lives in a two story home  ? ?Social Determinants of Health  ? ?Financial Resource Strain: Not on file  ?Food Insecurity: Not on file  ?Transportation Needs: Not on file  ?Physical Activity: Not on file  ?Stress: Not on file  ?Social Connections: Not on file  ?Intimate Partner Violence: Not on file  ?  ? ?Allergies  ?Allergen Reactions  ? Sulfa Antibiotics Other (See Comments)  ?  Causes headaches. ?Causes headaches. ?Causes headaches. ?  ? Grass Extracts [Gramineae Pollens] Itching and Other (See Comments)  ?  Runny itchy eyes & nose. ?Rhinitis  ?Runny itchy eyes & nose. ?Rhinitis  ?Rhinitis  ?Runny itchy eyes & nose. ?Rhinitis  ?Runny itchy eyes & nose. ?Rhinitis  ? ?stuffy nose, itchy eyes, asthma   ? Molds & Smuts Itching and Other (See Comments)  ?  Runny itchy eyes & nose. ?Runny itchy eyes & nose. ?Runny itchy eyes & nose. ?Runny itchy eyes &  nose. ?  ?  ? ?Outpatient Medications Prior to Visit  ?Medication Sig Dispense Refill  ? acetaminophen (TYLENOL) 325 MG tablet Take 650 mg by mouth every 6 (six) hours as needed.    ? albuterol (PROVENTIL) (2.5 MG/3ML) 0.083% nebulizer solution Take 3 mLs (2.5 mg total) by nebulization every 6 (six) hours as needed for wheezing or shortness of breath. 150 mL 1  ?  amLODipine (NORVASC) 5 MG tablet Take 1 tablet (5 mg total) by mouth daily. 90 tablet 3  ? busPIRone (BUSPAR) 5 MG tablet Take 1 tablet (5 mg total) by mouth 2 (two) times daily. Take one tablet in the morning and 1 tablet at bedtime 180 tablet 3  ? Carboxymethylcell-Hypromellose (GENTEAL) 0.25-0.3 % GEL Apply to eye. Apply to each eye at bedtime    ? carboxymethylcellul-glycerin (REFRESH OPTIVE) 0.5-0.9 % ophthalmic solution Apply to eye.    ? Cholecalciferol (VITAMIN D3) 50 MCG (2000 UT) capsule Take by mouth. Take one capsule daily    ? clobetasol ointment (TEMOVATE) 0.05 % Apply to affected area twice a week as directed 90 g 3  ? conjugated estrogens (PREMARIN) vaginal cream Place vaginally 2 (two) times a week. Apply 0.5g into vagina 2 x per week 90 g 3  ? fluticasone-salmeterol (ADVAIR HFA) 230-21 MCG/ACT inhaler Inhale 2 puffs into the lungs 2 (two) times daily. (Patient taking differently: Inhale 2 puffs into the lungs 2 (two) times daily. 2 puffs in the morning and 2 puffs in the evening.) 3 each 3  ? levothyroxine (SYNTHROID) 112 MCG tablet Take 1 tablet (112 mcg total) by mouth daily. 90 tablet 1  ? lisinopril (ZESTRIL) 5 MG tablet Take 1 tablet (5 mg total) by mouth daily. 90 tablet 3  ? Omega-3 Fatty Acids (FISH OIL) 1000 MG CAPS Take 1 capsule by mouth daily.    ? omeprazole (PRILOSEC) 20 MG capsule Take 20 mg by mouth daily. Take two capsules daily    ? prednisoLONE acetate (PRED FORTE) 1 % ophthalmic suspension Apply to eye.    ? pregabalin (LYRICA) 75 MG capsule Take 75 mg by mouth. Take 2-3 capsules daily for nerve pain    ? primidone  (MYSOLINE) 50 MG tablet Take 3 tablets (150 mg total) by mouth 2 (two) times daily. 540 tablet 1  ? propranolol (INDERAL) 10 MG tablet Take 1 tablet (10 mg total) by mouth 2 (two) times daily. 180 tablet 1  ? simvast

## 2022-01-04 ENCOUNTER — Other Ambulatory Visit: Payer: Self-pay

## 2022-01-04 DIAGNOSIS — R197 Diarrhea, unspecified: Secondary | ICD-10-CM

## 2022-01-04 DIAGNOSIS — B379 Candidiasis, unspecified: Secondary | ICD-10-CM

## 2022-01-04 MED ORDER — FLUCONAZOLE 150 MG PO TABS: 150.0000 mg | ORAL_TABLET | Freq: Once | ORAL | 0 refills | Status: AC

## 2022-01-04 MED ORDER — DOXYCYCLINE HYCLATE 100 MG PO TABS: 100.0000 mg | ORAL_TABLET | Freq: Two times a day (BID) | ORAL | 0 refills | Status: DC

## 2022-01-12 ENCOUNTER — Telehealth: Payer: Self-pay | Admitting: Gastroenterology

## 2022-01-12 ENCOUNTER — Other Ambulatory Visit: Payer: Self-pay

## 2022-01-12 DIAGNOSIS — B379 Candidiasis, unspecified: Secondary | ICD-10-CM

## 2022-01-12 DIAGNOSIS — R197 Diarrhea, unspecified: Secondary | ICD-10-CM

## 2022-01-12 MED ORDER — METRONIDAZOLE 250 MG PO TABS: 250.0000 mg | ORAL_TABLET | Freq: Three times a day (TID) | ORAL | 0 refills | Status: DC

## 2022-01-12 MED ORDER — FLUCONAZOLE 150 MG PO TABS: 150.0000 mg | ORAL_TABLET | Freq: Once | ORAL | 0 refills | Status: AC

## 2022-01-12 NOTE — Telephone Encounter (Signed)
Rx sent as requested. Called pt to make her aware. Verbalized acceptance and understanding. Advised to proceed to ED if she is still unable to keep food or fluids down as she will likely require further evaluation and treatment. Expressed understanding. ?

## 2022-01-12 NOTE — Telephone Encounter (Signed)
Patient called stating she cannot take the doxycycline as she is having some type of reaction to it.  She is unable to eat solid food and having nausea and vomiting.  Please call and advise what she can do.  Thank you. ?

## 2022-01-12 NOTE — Telephone Encounter (Signed)
Returned pt call to inquire further about her symptoms. States she did not start Doxy until 4/23. States she has been having N/V since beginning ATBs, has no appetite, denies fever and is still having loose stools daily. Advised to stop Doxy as this does not seem to be providing her any relief of her symptoms. Not convinced this is a true allergic reaction, therefore, did not add this medication to her allergy list. Advised to remain hydrated and to ensure she is drinking fluids that contain electrolytes. Routing this message to Dr. Tarri Glenn for further advice/recommendations. ?

## 2022-01-13 ENCOUNTER — Other Ambulatory Visit: Payer: Self-pay

## 2022-01-13 ENCOUNTER — Emergency Department (HOSPITAL_BASED_OUTPATIENT_CLINIC_OR_DEPARTMENT_OTHER)
Admission: EM | Admit: 2022-01-13 | Discharge: 2022-01-14 | Disposition: A | Discharge status: Home / Self Care | Payer: Medicare Other | Attending: Emergency Medicine | Admitting: Emergency Medicine

## 2022-01-13 ENCOUNTER — Emergency Department (HOSPITAL_BASED_OUTPATIENT_CLINIC_OR_DEPARTMENT_OTHER): Payer: Medicare Other | Admitting: Radiology

## 2022-01-13 DIAGNOSIS — Z7952 Long term (current) use of systemic steroids: Secondary | ICD-10-CM | POA: Diagnosis not present

## 2022-01-13 DIAGNOSIS — Z79899 Other long term (current) drug therapy: Secondary | ICD-10-CM | POA: Insufficient documentation

## 2022-01-13 DIAGNOSIS — R531 Weakness: Secondary | ICD-10-CM | POA: Insufficient documentation

## 2022-01-13 DIAGNOSIS — J45909 Unspecified asthma, uncomplicated: Secondary | ICD-10-CM | POA: Insufficient documentation

## 2022-01-13 DIAGNOSIS — E860 Dehydration: Secondary | ICD-10-CM | POA: Diagnosis not present

## 2022-01-13 DIAGNOSIS — F419 Anxiety disorder, unspecified: Secondary | ICD-10-CM | POA: Diagnosis not present

## 2022-01-13 DIAGNOSIS — Z7951 Long term (current) use of inhaled steroids: Secondary | ICD-10-CM | POA: Insufficient documentation

## 2022-01-13 DIAGNOSIS — E039 Hypothyroidism, unspecified: Secondary | ICD-10-CM | POA: Insufficient documentation

## 2022-01-13 DIAGNOSIS — I10 Essential (primary) hypertension: Secondary | ICD-10-CM | POA: Diagnosis not present

## 2022-01-13 LAB — COMPREHENSIVE METABOLIC PANEL
ALT: 28 U/L (ref 0–44)
AST: 24 U/L (ref 15–41)
Albumin: 3.8 g/dL (ref 3.5–5.0)
Alkaline Phosphatase: 119 U/L (ref 38–126)
Anion gap: 7 (ref 5–15)
BUN: 25 mg/dL — ABNORMAL HIGH (ref 8–23)
CO2: 24 mmol/L (ref 22–32)
Calcium: 9.6 mg/dL (ref 8.9–10.3)
Chloride: 106 mmol/L (ref 98–111)
Creatinine, Ser: 0.8 mg/dL (ref 0.44–1.00)
GFR, Estimated: >60 mL/min (ref >60)
Glucose, Bld: 100 mg/dL — ABNORMAL HIGH (ref 70–99)
Potassium: 4.5 mmol/L (ref 3.5–5.1)
Sodium: 137 mmol/L (ref 135–145)
Total Bilirubin: 0.3 mg/dL (ref 0.3–1.2)
Total Protein: 7 g/dL (ref 6.5–8.1)

## 2022-01-13 LAB — LACTIC ACID, PLASMA: Lactic Acid, Venous: 0.7 mmol/L (ref 0.5–1.9)

## 2022-01-13 LAB — URINALYSIS, ROUTINE W REFLEX MICROSCOPIC
Bilirubin Urine: NEGATIVE
Glucose, UA: NEGATIVE mg/dL
Hgb urine dipstick: NEGATIVE
Ketones, ur: NEGATIVE mg/dL
Leukocytes,Ua: NEGATIVE
Nitrite: NEGATIVE
Protein, ur: NEGATIVE mg/dL
Specific Gravity, Urine: 1.024 (ref 1.005–1.030)
pH: 5 (ref 5.0–8.0)

## 2022-01-13 LAB — CBC WITH DIFFERENTIAL/PLATELET
Abs Immature Granulocytes: 0.01 10*3/uL (ref 0.00–0.07)
Basophils Absolute: 0 10*3/uL (ref 0.0–0.1)
Basophils Relative: 0 %
Eosinophils Absolute: 0.2 10*3/uL (ref 0.0–0.5)
Eosinophils Relative: 2 %
HCT: 40.9 % (ref 36.0–46.0)
Hemoglobin: 13.3 g/dL (ref 12.0–15.0)
Immature Granulocytes: 0 %
Lymphocytes Relative: 32 %
Lymphs Abs: 2.8 10*3/uL (ref 0.7–4.0)
MCH: 28.6 pg (ref 26.0–34.0)
MCHC: 32.5 g/dL (ref 30.0–36.0)
MCV: 88 fL (ref 80.0–100.0)
Monocytes Absolute: 0.8 10*3/uL (ref 0.1–1.0)
Monocytes Relative: 9 %
Neutro Abs: 5 10*3/uL (ref 1.7–7.7)
Neutrophils Relative %: 57 %
Platelets: 290 10*3/uL (ref 150–400)
RBC: 4.65 MIL/uL (ref 3.87–5.11)
RDW: 13.7 % (ref 11.5–15.5)
WBC: 9 10*3/uL (ref 4.0–10.5)
nRBC: 0 % (ref 0.0–0.2)

## 2022-01-13 MED ORDER — ONDANSETRON 4 MG PO TBDP
8.0000 mg | ORAL_TABLET | Freq: Once | ORAL | Status: AC
  Administered 2022-01-14: 8 mg via ORAL
  Filled 2022-01-13: qty 2

## 2022-01-13 MED ORDER — SODIUM CHLORIDE 0.9 % IV BOLUS
1000.0000 mL | Freq: Once | INTRAVENOUS | Status: AC
  Administered 2022-01-14: 1000 mL via INTRAVENOUS

## 2022-01-13 MED ORDER — ONDANSETRON HCL 4 MG/2ML IJ SOLN: 4.0000 mg | Freq: Once | INTRAMUSCULAR | Status: DC

## 2022-01-13 NOTE — ED Provider Notes (Signed)
?[facility] ?Provider Note ? ? ?CSN: [id number] ?Arrival date & time: 01/13/22  1940 ? ?  ? ?History ? ?Chief Complaint  ?Patient presents with  ? Weakness  ? ? ?Mariah Snyder is a 76 y.o. female. ? ?The history is provided by the patient.  ?Weakness ?Severity:  Moderate ?Onset quality:  Gradual ?Timing:  Constant ?Chronicity:  New ?Relieved by:  Nothing ?Worsened by:  Nothing ?Associated symptoms: anorexia, diarrhea and nausea   ?Associated symptoms: no abdominal pain, no dysuria, no hematochezia and no vomiting   ?Patient w/history of asthma, hypertension, hyperlipidemia presents with generalized weakness.  Patient reports "my gastroenterologist tried to kill me" when she had a colonoscopy "that I did not need earlier this month" ?She reports that she aspirated during the event and has been on multiple rounds of antibiotics.  She reports she has no energy and very little appetite.  She also had recent vomiting.  No hematemesis.  She reports chronic diarrhea.  No current fever/headache/abdominal pain/chest pain.  She reports feeling dehydrated and having decreased urine output ?  ?Past Medical History:  ?Diagnosis Date  ? Anemia   ? Anxiety   ? Arthritis   ? Asthma   ? Fibromyalgia   ? Gallstones   ? GERD (gastroesophageal reflux disease)   ? Hyperlipidemia   ? Hypertension   ? Hypothyroidism   ? Kidney stones   ? Peptic ulcer   ? Rectal prolapse   ? Sleep apnea   ? Urinary tract infection   ? ? ?Home Medications ?Prior to Admission medications   ?Medication Sig Start Date End Date Taking? Authorizing Provider  ?ondansetron (ZOFRAN-ODT) 4 MG disintegrating tablet '4mg'$  ODT q4 hours prn nausea/vomit 01/14/22  Yes Ripley Fraise, MD  ?acetaminophen (TYLENOL) 325 MG tablet Take 650 mg by mouth every 6 (six) hours as needed.    [provider]  ?albuterol (PROVENTIL) (2.5 MG/3ML) 0.083% nebulizer solution Take 3 mLs (2.5 mg total) by nebulization every 6 (six) hours as needed for  wheezing or shortness of breath. 12/21/21   Hoyt Koch, MD  ?amLODipine (NORVASC) 5 MG tablet Take 1 tablet (5 mg total) by mouth daily. 11/07/21   Hoyt Koch, MD  ?busPIRone (BUSPAR) 5 MG tablet Take 1 tablet (5 mg total) by mouth 2 (two) times daily. Take one tablet in the morning and 1 tablet at bedtime 11/07/21   Hoyt Koch, MD  ?Carboxymethylcell-Hypromellose (GENTEAL) 0.25-0.3 % GEL Apply to eye. Apply to each eye at bedtime    [provider]  ?carboxymethylcellul-glycerin (REFRESH OPTIVE) 0.5-0.9 % ophthalmic solution Apply to eye.    [provider]  ?Cholecalciferol (VITAMIN D3) 50 MCG (2000 UT) capsule Take by mouth. Take one capsule daily    [provider]  ?clobetasol ointment (TEMOVATE) 0.05 % Apply to affected area twice a week as directed 11/07/21   Hoyt Koch, MD  ?conjugated estrogens (PREMARIN) vaginal cream Place vaginally 2 (two) times a week. Apply 0.5g into vagina 2 x per week 05/01/21   Hoyt Koch, MD  ?fluticasone-salmeterol (ADVAIR HFA) 230-21 MCG/ACT inhaler Inhale 2 puffs into the lungs 2 (two) times daily. ?Patient taking differently: Inhale 2 puffs into the lungs 2 (two) times daily. 2 puffs in the morning and 2 puffs in the evening. 03/13/21   Freddi Starr, MD  ?levothyroxine (SYNTHROID) 112 MCG tablet Take 1 tablet (112 mcg total) by mouth daily. 10/09/21   Hoyt Koch, MD  ?lisinopril (  ZESTRIL) 5 MG tablet Take 1 tablet (5 mg total) by mouth daily. 11/07/21   Hoyt Koch, MD  ?metroNIDAZOLE (FLAGYL) 250 MG tablet Take 1 tablet (250 mg total) by mouth 3 (three) times daily for 14 days. 01/12/22 01/26/22  Thornton Park, MD  ?Omega-3 Fatty Acids (FISH OIL) 1000 MG CAPS Take 1 capsule by mouth daily.    [provider]  ?omeprazole (PRILOSEC) 20 MG capsule Take 20 mg by mouth daily. Take two capsules daily 02/28/21   [provider]  ?prednisoLONE acetate (PRED FORTE) 1  % ophthalmic suspension Apply to eye. 03/17/18   [provider]  ?pregabalin (LYRICA) 75 MG capsule Take 75 mg by mouth. Take 2-3 capsules daily for nerve pain 02/28/21   [provider]  ?primidone (MYSOLINE) 50 MG tablet Take 3 tablets (150 mg total) by mouth 2 (two) times daily. 04/10/21 02/07/25  TatEustace Quail, DO  ?propranolol (INDERAL) 10 MG tablet Take 1 tablet (10 mg total) by mouth 2 (two) times daily. 04/10/21 05/31/22  TatEustace Quail, DO  ?simvastatin (ZOCOR) 20 MG tablet Take 1 tablet (20 mg total) by mouth daily. 11/07/21   Hoyt Koch, MD  ?   ? ?Allergies    ?Sulfa antibiotics, Grass extracts [gramineae pollens], and Molds & smuts   ? ?Review of Systems   ?Review of Systems  ?Gastrointestinal:  Positive for anorexia, diarrhea and nausea. Negative for abdominal pain, hematochezia and vomiting.  ?Genitourinary:  Negative for dysuria.  ?Neurological:  Positive for weakness.  ? ?Physical Exam ?Updated Vital Signs ?BP 137/76   Pulse 80   Temp 98.4 ?F (36.9 ?C)   Resp 20   Ht 1.626 m ('5\' 4"'$ )   Wt 71 kg   SpO2 97%   BMI 26.87 kg/m?  ?Physical Exam ?CONSTITUTIONAL: Elderly, anxious ?HEAD: Normocephalic/atraumatic ?EYES: EOMI/PERRL, no icterus ?ENMT: Mucous membranes moist ?NECK: supple no meningeal signs ?SPINE/BACK:entire spine nontender ?CV: S1/S2 noted, no murmurs/rubs/gallops noted ?LUNGS: Lungs are clear to auscultation bilaterally, no apparent distress ?ABDOMEN: soft, nontender, no rebound or guarding, bowel sounds noted throughout abdomen ?GU:no cva tenderness ?NEURO: Pt is awake/alert/appropriate, moves all extremitiesx4.  No facial droop.  Tremor noted ?EXTREMITIES: pulses normal/equal, full ROM ?SKIN: warm, color normal ?PSYCH: Anxious ? ?ED Results / Procedures / Treatments   ?Labs ?(all labs ordered are listed, but only abnormal results are displayed) ?Labs Reviewed  ?COMPREHENSIVE METABOLIC PANEL - Abnormal; Notable for the following components:  ?    Result Value  ?  Glucose, Bld 100 (*)   ? BUN 25 (*)   ? All other components within normal limits  ?LACTIC ACID, PLASMA  ?CBC WITH DIFFERENTIAL/PLATELET  ?URINALYSIS, ROUTINE W REFLEX MICROSCOPIC  ? ? ?EKG ?ED ECG REPORT ? ? Date: 01/13/2022 2326 ? Rate: 75 ? Rhythm: normal sinus rhythm ? QRS Axis: normal ? Intervals: normal ? ST/T Wave abnormalities: nonspecific T wave changes ? Conduction Disutrbances:none ? Narrative Interpretation:  ? Old EKG Reviewed: none available ? ?I have personally reviewed the EKG tracing and agree with the computerized printout as noted. ? ? ?Radiology ?DG Chest 2 View ? ?Result Date: 01/13/2022 ?CLINICAL DATA:  Recent aspiration pneumonia EXAM: CHEST - 2 VIEW COMPARISON:  12/21/2021 FINDINGS: Cardiac size is within normal limits. Thoracic aorta is tortuous and ectatic. There are no signs of alveolar pulmonary edema. There is interval clearing of patchy infiltrates in the left mid and left lower lung fields. There are no new focal infiltrates. There is no  pleural effusion or pneumothorax. IMPRESSION: There is interval clearing of patchy infiltrates in the left lung. There are no new focal infiltrates. There is no pleural effusion. Electronically Signed   By: Elmer Picker M.D.   On: 01/13/2022 21:14   ? ?Procedures ?Procedures  ? ? ?Medications Ordered in ED ?Medications  ?ondansetron (ZOFRAN) injection 4 mg (has no administration in time range)  ?sodium chloride 0.9 % bolus 1,000 mL (0 mLs Intravenous Stopped 01/14/22 0248)  ?ondansetron (ZOFRAN-ODT) disintegrating tablet 8 mg (8 mg Oral Given 01/14/22 0052)  ? ? ?ED Course/ Medical Decision Making/ A&P ?Clinical Course as of 01/14/22 0424  ?Sat Jan 13, 2022  ?2341 BUN(!): 25 ?Mild dehydration noted [DW]  ?2346 Review of chart reveals patient underwent colonoscopy for chronic diarrhea on April 4.  She vomited during the procedure and apparently aspirated.  She has been on several rounds of antibiotics including Augmentin and doxycycline and has seen  pulmonology [DW]  ?2347 Patient reports since taking antibiotics she has had no appetite and feels very nauseous.  Fortunately her chest x-ray is improved from previous and it appears the pneumonia is [facility]

## 2022-01-13 NOTE — ED Triage Notes (Signed)
Patient reports to the ER for her essential tremor, recent aspiration pneumonia, nausea, and anorexia. Patient reports she has been unable to eat for x6 days. Reports she was on antibiotics for aspiration pneumonia following aspiration during a coloscopy and has had back and forth between her doctors.  ?

## 2022-01-14 DIAGNOSIS — R531 Weakness: Secondary | ICD-10-CM | POA: Diagnosis not present

## 2022-01-14 MED ORDER — ONDANSETRON 4 MG PO TBDP
ORAL_TABLET | ORAL | 0 refills | Status: DC
Start: 2022-01-14 — End: 2022-01-24

## 2022-01-14 NOTE — ED Notes (Signed)
Pt agreeable to try applesauce at this time.  Has ambulated again to bathroom with nurse assistance for safety -- denies recurrent nausea or dizziness when ambulating (post IVF bolus)   ?

## 2022-01-14 NOTE — ED Notes (Signed)
Pt just provided 8 oz water at this time - provider requests pt attempt oral fluids. ?

## 2022-01-14 NOTE — Discharge Instructions (Signed)

## 2022-01-14 NOTE — ED Notes (Signed)
Pt agreeable with d/c plan as discussed by provider- this nurse has verbally reinforced d/c instructions and provided pt with written copy - pt acknowledges verbal understanding and denies any additional questions, concerns, needs - pt ambulatory independently at discharge with slow but steady gait; no distress; vitals stable.  ?

## 2022-01-15 ENCOUNTER — Encounter: Payer: Self-pay | Admitting: Pulmonary Disease

## 2022-01-15 ENCOUNTER — Telehealth: Payer: Self-pay | Admitting: Pulmonary Disease

## 2022-01-15 ENCOUNTER — Encounter: Payer: Self-pay | Admitting: Internal Medicine

## 2022-01-15 ENCOUNTER — Other Ambulatory Visit: Payer: Self-pay | Admitting: Internal Medicine

## 2022-01-15 ENCOUNTER — Other Ambulatory Visit: Payer: Self-pay

## 2022-01-15 DIAGNOSIS — J4540 Moderate persistent asthma, uncomplicated: Secondary | ICD-10-CM

## 2022-01-15 MED ORDER — PREGABALIN 75 MG PO CAPS: 75.0000 mg | ORAL_CAPSULE | Freq: Three times a day (TID) | ORAL | 1 refills | Status: DC

## 2022-01-15 NOTE — Telephone Encounter (Signed)
?  Patient called in stating that the nebulizer that was ordered was delivered to the wrong address. She received a notification from [facility] on Saturday letting her know it was delivered, but delivery was [place], and patient lives in [place]. Patient is requestioning that another prescription be called into [facility] for nebulizer. Her correct address is [address], [place], [place] [address]. Ms. Vastine also would like to know if her insurance was charged for this and if so, can someone let her know. Good call back number is [phone] ?

## 2022-01-16 NOTE — Telephone Encounter (Signed)
Called and spoke with pt about the nebulizer machine being delivered to wrong address. Stated to pt that she needed to call [facility] about this and she stated that she did reach out to [facility] and they were going to call her back about getting this fixed. Nothing further needed. ?

## 2022-01-19 MED ORDER — ALBUTEROL SULFATE (2.5 MG/3ML) 0.083% IN NEBU: 2.5000 mg | INHALATION_SOLUTION | Freq: Four times a day (QID) | RESPIRATORY_TRACT | 1 refills | Status: DC | PRN

## 2022-01-24 ENCOUNTER — Ambulatory Visit (INDEPENDENT_AMBULATORY_CARE_PROVIDER_SITE_OTHER): Payer: Medicare Other | Admitting: Gastroenterology

## 2022-01-24 ENCOUNTER — Encounter: Payer: Self-pay | Admitting: Gastroenterology

## 2022-01-24 VITALS — Ht 64.0 in | Wt 154.0 lb

## 2022-01-24 DIAGNOSIS — R159 Full incontinence of feces: Secondary | ICD-10-CM | POA: Diagnosis not present

## 2022-01-24 DIAGNOSIS — R197 Diarrhea, unspecified: Secondary | ICD-10-CM | POA: Diagnosis not present

## 2022-01-24 DIAGNOSIS — R152 Fecal urgency: Secondary | ICD-10-CM

## 2022-01-24 MED ORDER — COLESTIPOL HCL 1 G PO TABS: 1.0000 g | ORAL_TABLET | Freq: Two times a day (BID) | ORAL | 3 refills | Status: DC

## 2022-01-24 NOTE — Progress Notes (Signed)
? ?Referring Provider: Hoyt Koch, * ?Primary Care Physician:  Hoyt Koch, MD ? ? ?Reason for Consultation:  Diarrhea ? ? ?IMPRESSION:  ?Diarrhea with fecal incontinence -  Fecal calprotectin, pancreatic elastase and GI pathogen panel were essentially normal. Colonoscopy with random biopsies was normal except for one small isolated ulcer in the sigmoid colon of unclear clinical significance.  No significant change with antibiotics for aspiration pneumonia or subsequent antibiotics for possible SIBO. Discussed trial of bile acid binder +/- antispasmodic. She declined antispasmodic due to potential dry mouth side effect. Temporal association with escalation in symptoms with increase in synthroid dosing. She will review with Dr. Sharlet Salina.  ? ?Rectal prolapse with history of pelvic floor physical therapy.  Does not wish to pursue additional pelvic floor physical therapy. Could consider anorectal manometry in the future.  ? ?History of colon polyps.  Tubular adenomas removed on recent colonoscopy. She made it clear that she will not have another colonoscopy.  ? ?Given her anger following the colonoscopy and her unhappiness with me, I offered her a referral to a tertiary care center for a second opinion.  However she declined.  She wished to to continue to follow-up. ? ? ?PLAN: ?- Colestipol 1 gram QAM increased to BID after 1 week if not improving ?- MyChart message after 2 weeks ?- I encouraged her to consider referral to [facility] for a second opinion  ? ?HPI: Mariah Snyder is a 76 y.o. female initially referred by Dr. Sharlet Salina for evaluation of diarrhea with associated abdominal discomfort. She was seen in consultation 12/04/21 and had endoscopic evaluation 12/19/21. The interval history is obtained through the patient and review of her electronic health record. She has a history of rectal prolapse with prior pelvic floor physical therapy with resolution of urinary incontinence.  History is also  remarkable for anemia, anxiety, asthma, fibromyalgia, reflux, hypercholesterolemia, hypertension, nephrolithiasis, sleep apnea.  She had a cholecystectomy for gallstones in 2012.   ? ?She was initially referred for loose stools with urgency that started in 2021. Did not have trouble with control until more recently. Some mucous. No blood in the stool. Some associated lower abdominal discomfort. She has concurrent urinary incontinence. Seen by Urogynecology at [facility] 10/2020 for overactive bladder having failed Vesicare and Detrol. She completed physical therapy at [facility] for both last year but this did not help with the fecal incontinence. Frequently feels constipated despite having reguarly bowel movements. No sense of complete evacuation. Significant straining. Has previously tried fiber, Metamucil, stool softeners, Miralax with incomplete relieve of symptoms.  Having frequent accidents including while singing during a recent choral concert. She stopped eating greens because this triggers the episodes. Seen by neurology. MRI/MRA of the head negative.  ? ?Stool studies in March showed a fecal calprotectin less than 16, pancreatic elastase over 500, and a GI pathogen panel that was negative except for norovirus. ? ?Colonoscopy 12/19/2021 showed left-sided diverticulosis, 3 tubular adenomas, and a small ulcer in the sigmoid colon.  She vomited during the procedure.  Although there was no witnessed aspiration I did recommend empiric treatment with Augmentin 875 mg twice daily for 7 days for possible aspiration.  Chest x-ray showed left lower lobe infiltrates.  She was seen by Dr. Erin Fulling for persistent shortness of breath especially with exertion.  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  I had recommended a trial of Xifaxan.  Due to cost this was switched to doxycycline  100 mg twice daily for 14 days. ? ?Labs 01/13/2022 show a normal comprehensive metabolic panel except  for a glucose of 100 and a normal CBC. ? ?She is angry about the procedure with both the aspiration pneumonia that occurred and feeling like her body was black and blue after the procedure. ? ?Continues to have post-prandial diarrhea. "Even Metamucil will come back out." ?3 bowel movements a day. Some mucous. No blood.  ?Diarrhea is slightly improved after the antibiotics for aspiration pneumonia.  ?Now only having diarrhea after she eats.  ? ?No new GI complaints at this time. ? ?Endoscopic history: ?- She had a colonoscopy at [facility] that had to be rescheduled to the hospital under anesthesia due to a tortuous colon. ?- Colonoscopy 2014 at [facility] with Dr. Brandon Melnick showed sigmoid diverticulosis and was otherwise normal. She had the procedure performed at the hospital because she failed conscious sedation. Surveillance recommended in 10 years.  ?-Colonoscopy 12/19/2021 showed a small solitary ulcer in the sigmoid colon, 3 colon polyps, left-sided diverticulosis.  All of the polyps were tubular adenomas.  The biopsies obtained in the area of the ulceration were normal. ? ? ?Past Medical History:  ?Diagnosis Date  ? Anemia   ? Anxiety   ? Arthritis   ? Asthma   ? Fibromyalgia   ? Gallstones   ? GERD (gastroesophageal reflux disease)   ? Hyperlipidemia   ? Hypertension   ? Hypothyroidism   ? Kidney stones   ? Peptic ulcer   ? Rectal prolapse   ? Sleep apnea   ? Urinary tract infection   ? ? ?Past Surgical History:  ?Procedure Laterality Date  ? bunyionectomy Left   ? CHOLECYSTECTOMY    ? ELBOW ARTHROSCOPY Left   ? LAPAROSCOPIC ASSISTED VAGINAL HYSTERECTOMY    ? SHOULDER ARTHROSCOPY Right   ? TONSILLECTOMY    ? ? ? ?Current Outpatient Medications  ?Medication Sig Dispense Refill  ? acetaminophen (TYLENOL) 325 MG tablet Take 650 mg by mouth every 6 (six) hours as needed.    ? albuterol (PROVENTIL) (2.5 MG/3ML) 0.083% nebulizer solution Take 3 mLs (2.5 mg total) by nebulization every 6 (six) hours as needed for wheezing or  shortness of breath. 150 mL 1  ? amLODipine (NORVASC) 5 MG tablet Take 1 tablet (5 mg total) by mouth daily. 90 tablet 3  ? busPIRone (BUSPAR) 5 MG tablet Take 1 tablet (5 mg total) by mouth 2 (two) times daily. Take one tablet in the morning and 1 tablet at bedtime 180 tablet 3  ? Carboxymethylcell-Hypromellose (GENTEAL) 0.25-0.3 % GEL Apply to eye. Apply to each eye at bedtime    ? carboxymethylcellul-glycerin (REFRESH OPTIVE) 0.5-0.9 % ophthalmic solution Apply to eye.    ? Cholecalciferol (VITAMIN D3) 50 MCG (2000 UT) capsule Take 2,000 Units by mouth daily. Take one capsule daily    ? clobetasol ointment (TEMOVATE) 0.05 % Apply to affected area twice a week as directed 90 g 3  ? colestipol (COLESTID) 1 g tablet Take 1 tablet (1 g total) by mouth 2 (two) times daily. 180 tablet 3  ? conjugated estrogens (PREMARIN) vaginal cream Place vaginally 2 (two) times a week. Apply 0.5g into vagina 2 x per week 90 g 3  ? fluticasone-salmeterol (ADVAIR HFA) 230-21 MCG/ACT inhaler Inhale 2 puffs into the lungs 2 (two) times daily. (Patient taking differently: Inhale 2 puffs into the lungs 2 (two) times daily. 2 puffs in the morning and 2 puffs in the evening.) 3 each  3  ? levothyroxine (SYNTHROID) 112 MCG tablet Take 1 tablet (112 mcg total) by mouth daily. 90 tablet 1  ? lisinopril (ZESTRIL) 5 MG tablet Take 1 tablet (5 mg total) by mouth daily. 90 tablet 3  ? Omega-3 Fatty Acids (FISH OIL) 1000 MG CAPS Take 1 capsule by mouth daily.    ? omeprazole (PRILOSEC) 20 MG capsule Take 20 mg by mouth 2 (two) times daily before a meal.    ? pregabalin (LYRICA) 75 MG capsule Take 1 capsule (75 mg total) by mouth 3 (three) times daily. 270 capsule 1  ? primidone (MYSOLINE) 50 MG tablet Take 3 tablets (150 mg total) by mouth 2 (two) times daily. 540 tablet 1  ? propranolol (INDERAL) 10 MG tablet Take 1 tablet (10 mg total) by mouth 2 (two) times daily. 180 tablet 1  ? simvastatin (ZOCOR) 20 MG tablet Take 1 tablet (20 mg total) by  mouth daily. 90 tablet 3  ? ?No current facility-administered medications for this visit.  ? ? ?Allergies as of 01/24/2022 - Review Complete 01/24/2022  ?Allergen Reaction Noted  ? Doxycycline  01/24/2022  ? Su

## 2022-01-24 NOTE — Patient Instructions (Addendum)
It was my pleasure to provide care to you today. Based on our discussion, I am providing you with my recommendations below: ? ?RECOMMENDATION(S):  ? ? Colestipol 1 gram QAM increased to BID after 1 week if not improving. ? ? ?FOLLOW UP: ? ?I would like for you to follow up with me via Mychart in 2 weeks with an update.  ? ?BMI: ? ?If you are age 77 or older, your body mass index should be between 23-30. Your Body mass index is 26.43 kg/m?Marland Kitchen If this is out of the aforementioned range listed, please consider follow up with your Primary Care Provider. ? ?If you are age 20 or younger, your body mass index should be between 19-25. Your Body mass index is 26.43 kg/m?Marland Kitchen If this is out of the aformentioned range listed, please consider follow up with your Primary Care Provider.  ? ?MY CHART: ? ?The [facility] providers would like to encourage you to use [facility] to communicate with providers for non-urgent requests or questions.  Due to long hold times on the telephone, sending your provider a message by [facility] may be a faster and more efficient way to get a response.  Please allow 48 business hours for a response.  Please remember that this is for non-urgent requests.  ? ?Thank you for trusting me with your gastrointestinal care!   ? ?Thornton Park, MD, MPH ? ?

## 2022-01-26 ENCOUNTER — Telehealth: Payer: Self-pay | Admitting: Neurology

## 2022-01-26 NOTE — Telephone Encounter (Signed)
Called patient back and explained Dr. Arturo Morton last notes and that she has not seen a leg tremor in her before so we will be putting her on th CX list  ?

## 2022-01-26 NOTE — Telephone Encounter (Signed)
Patient called with concerns she is shaking so bad since having her colonoscopy.  ? ?She said her left foot will not stop shaking and she has to hold it in place while she drives. ? ?Patient is requesting to to be seen sooner than scheduled, she'd like to speak with clinical staff. ? ? ?

## 2022-01-29 ENCOUNTER — Ambulatory Visit (INDEPENDENT_AMBULATORY_CARE_PROVIDER_SITE_OTHER): Payer: Medicare Other | Admitting: Podiatry

## 2022-01-29 DIAGNOSIS — M79674 Pain in right toe(s): Secondary | ICD-10-CM | POA: Diagnosis not present

## 2022-01-29 DIAGNOSIS — B351 Tinea unguium: Secondary | ICD-10-CM

## 2022-01-29 DIAGNOSIS — M79675 Pain in left toe(s): Secondary | ICD-10-CM | POA: Diagnosis not present

## 2022-01-31 NOTE — Progress Notes (Signed)
Subjective: ?76 year old female presents the office today for concerns of her nails becoming thickened discolored and elongated she cannot trim himself and causing discomfort.  No swelling redness or any drainage to the toenail sites.  She has no other concerns. ?Objective: ?AAO x3, NAD ?DP/PT pulses palpable bilaterally, CRT less than 3 seconds ?Nails are hypertrophic, dystrophic, brittle, discolored, elongated ?10.  Most notably the right hallux toenail is the most thickened.  Tenderness nails 1-5 bilaterally. No open lesions or pre-ulcerative lesions are identified today. ?No pain with calf compression, swelling, warmth, erythema ? ?Assessment: ?Ingrown toenail, symptomatic onychomycosis ? ?Plan: ?-All treatment options discussed with the patient including all alternatives, risks, complications.  ?-Sharply debrided nails x10 without any complications or bleeding.  Discussed urea nail gel to help with the thickening. ?-Patient encouraged to call the office with any questions, concerns, change in symptoms.  ? ?Trula Slade DPM ? ?

## 2022-02-14 ENCOUNTER — Ambulatory Visit (INDEPENDENT_AMBULATORY_CARE_PROVIDER_SITE_OTHER): Payer: Medicare Other

## 2022-02-14 DIAGNOSIS — Z0000 Encounter for general adult medical examination without abnormal findings: Secondary | ICD-10-CM

## 2022-02-14 NOTE — Progress Notes (Addendum)
I connected with Mariah Snyder today by telephone and verified that I am speaking with the correct person using two identifiers. Location patient: home Location provider: work Persons participating in the virtual visit: patient, provider.   I discussed the limitations, risks, security and privacy concerns of performing an evaluation and management service by telephone and the availability of in person appointments. I also discussed with the patient that there may be a patient responsible charge related to this service. The patient expressed understanding and verbally consented to this telephonic visit.    Interactive audio and video telecommunications were attempted between this provider and patient, however failed, due to patient having technical difficulties OR patient did not have access to video capability.  We continued and completed visit with audio only.  Some vital signs may be absent or patient reported.   Time Spent with patient on telephone encounter: 30 minutes  Subjective:   Mariah Snyder is a 76 y.o. female who presents for Medicare Annual (Subsequent) preventive examination.  Review of Systems     Cardiac Risk Factors include: advanced age (>36mn, >>53women);dyslipidemia;family history of premature cardiovascular disease;hypertension     Objective:    Today's Vitals   02/14/22 1403  PainSc: 6    There is no height or weight on file to calculate BMI.     02/14/2022    2:07 PM 01/13/2022    7:58 PM 10/17/2021   10:45 AM 04/10/2021   10:19 AM  Advanced Directives  Does Patient Have a Medical Advance Directive? Yes No Yes Yes  Type of Advance Directive Living will;Healthcare Power of Attorney  Living will [place] will;Out of facility DNR (pink MOST or yellow form)  Does patient want to make changes to medical advance directive? No - Patient declined     [place] of [place]? No - copy requested     Would patient like  information on creating a medical advance directive?  No - Patient declined      Current Medications (verified) Outpatient Encounter Medications as of 02/14/2022  Medication Sig   acetaminophen (TYLENOL) 325 MG tablet Take 650 mg by mouth every 6 (six) hours as needed.   albuterol (PROVENTIL) (2.5 MG/3ML) 0.083% nebulizer solution Take 3 mLs (2.5 mg total) by nebulization every 6 (six) hours as needed for wheezing or shortness of breath.   amLODipine (NORVASC) 5 MG tablet Take 1 tablet (5 mg total) by mouth daily.   busPIRone (BUSPAR) 5 MG tablet Take 1 tablet (5 mg total) by mouth 2 (two) times daily. Take one tablet in the morning and 1 tablet at bedtime   Carboxymethylcell-Hypromellose (GENTEAL) 0.25-0.3 % GEL Apply to eye. Apply to each eye at bedtime   carboxymethylcellul-glycerin (REFRESH OPTIVE) 0.5-0.9 % ophthalmic solution Apply to eye.   Cholecalciferol (VITAMIN D3) 50 MCG (2000 UT) capsule Take 2,000 Units by mouth daily. Take one capsule daily   clobetasol ointment (TEMOVATE) 0.05 % Apply to affected area twice a week as directed   conjugated estrogens (PREMARIN) vaginal cream Place vaginally 2 (two) times a week. Apply 0.5g into vagina 2 x per week   fluticasone-salmeterol (ADVAIR HFA) 230-21 MCG/ACT inhaler Inhale 2 puffs into the lungs 2 (two) times daily. (Patient taking differently: Inhale 2 puffs into the lungs 2 (two) times daily. 2 puffs in the morning and 2 puffs in the evening.)   levothyroxine (SYNTHROID) 112 MCG tablet Take 1 tablet (112 mcg total) by mouth daily.  lisinopril (ZESTRIL) 5 MG tablet Take 1 tablet (5 mg total) by mouth daily.   Omega-3 Fatty Acids (FISH OIL) 1000 MG CAPS Take 1 capsule by mouth daily.   omeprazole (PRILOSEC) 20 MG capsule Take 20 mg by mouth 2 (two) times daily before a meal.   pregabalin (LYRICA) 75 MG capsule Take 1 capsule (75 mg total) by mouth 3 (three) times daily.   primidone (MYSOLINE) 50 MG tablet Take 3 tablets (150 mg total) by  mouth 2 (two) times daily.   propranolol (INDERAL) 10 MG tablet Take 1 tablet (10 mg total) by mouth 2 (two) times daily.   simvastatin (ZOCOR) 20 MG tablet Take 1 tablet (20 mg total) by mouth daily.   colestipol (COLESTID) 1 g tablet Take 1 tablet (1 g total) by mouth 2 (two) times daily.   No facility-administered encounter medications on file as of 02/14/2022.    Allergies (verified) Doxycycline, Sulfa antibiotics, Grass extracts [gramineae pollens], and Molds & smuts   History: Past Medical History:  Diagnosis Date   Anemia    Anxiety    Arthritis    Asthma    Fibromyalgia    Gallstones    GERD (gastroesophageal reflux disease)    Hyperlipidemia    Hypertension    Hypothyroidism    Kidney stones    Peptic ulcer    Rectal prolapse    Sleep apnea    Urinary tract infection    Past Surgical History:  Procedure Laterality Date   bunyionectomy Left    CHOLECYSTECTOMY     ELBOW ARTHROSCOPY Left    LAPAROSCOPIC ASSISTED VAGINAL HYSTERECTOMY     SHOULDER ARTHROSCOPY Right    TONSILLECTOMY     Family History  Problem Relation Age of Onset   Asthma Mother    Tremor Mother    Aneurysm Father        AAA   Heart attack Brother        Died from Heart Attack at age 52   Heart disease Brother    Other Brother        Post Polio Syndrome   Heart disease Paternal Grandmother    Tremor Child    Colon cancer Neg Hx    Esophageal cancer Neg Hx    Pancreatic cancer Neg Hx    Colon polyps Neg Hx    Social History   Socioeconomic History   Marital status: Widowed    Spouse name: Not on file   Number of children: 2   Years of education: Not on file   Highest education level: Not on file  Occupational History   Occupation: retired    Comment: worked in radiology  Tobacco Use   Smoking status: Former    Types: Cigarettes    Passive exposure: Past   Smokeless tobacco: Never  Scientific laboratory technician Use: Never used  Substance and Sexual Activity   Alcohol use: Yes     Comment: Has a beer or glass of wine occasionally   Drug use: Never   Sexual activity: Not Currently  Other Topics Concern   Not on file  Social History Narrative   Right Handed    Lives in a two story home   Social Determinants of Health   Financial Resource Strain: Low Risk    Difficulty of Paying Living Expenses: Not hard at all  Food Insecurity: No Food Insecurity   Worried About [place] in the Last Year: Never true   Ran  Out of Food in the Last Year: Never true  Transportation Needs: No Transportation Needs   Lack of Transportation (Medical): No   Lack of Transportation (Non-Medical): No  Physical Activity: Sufficiently Active   Days of Exercise per Week: 7 days   Minutes of Exercise per Session: 30 min  Stress: No Stress Concern Present   Feeling of Stress : Not at all  Social Connections: Moderately Integrated   Frequency of Communication with Friends and Family: More than three times a week   Frequency of Social Gatherings with Friends and Family: More than three times a week   Attends Religious Services: 1 to 4 times per year   Active Member of [employer] or Organizations: Yes   Attends Archivist Meetings: 1 to 4 times per year   Marital Status: Widowed    Tobacco Counseling Counseling given: Not Answered   Clinical Intake:  Pre-visit preparation completed: Yes  Pain : 0-10 Pain Score: 6  Pain Location: Generalized (due to fibromyalgia) Pain Orientation: Other (Comment) (all over body) Pain Descriptors / Indicators: Aching, Discomfort Pain Onset: More than a month ago Pain Frequency: Constant     BMI - recorded: 26.43 Nutritional Status: BMI 25 -29 Overweight Nutritional Risks: Other (Comment) (low appetite) Diabetes: No  How often do you need to have someone help you when you read instructions, pamphlets, or other written materials from your doctor or pharmacy?: 1 - Never What is the 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   Activities of Daily Living    02/14/2022    2:14 PM 04/28/2021   10:06 AM  In your present state of health, do you have any difficulty performing the following activities:  Hearing? 0 0  Vision? 0 0  Difficulty concentrating or making decisions? 0 0  Walking or climbing stairs? 0 0  Dressing or bathing? 0 0  Doing errands, shopping? 0 0  Preparing Food and eating ? N   Using the Toilet? N   In the past six months, have you accidently leaked urine? Y   Do you have problems with loss of bowel control? Y   Managing your Medications? N   Managing your Finances? N   Housekeeping or managing your Housekeeping? N     Patient Care Team: Hoyt Koch, MD as PCP - General (Internal Medicine) Tat, Eustace Quail, DO as Consulting Physician (Neurology) Wende Mott, MD (Ophthalmology) Augustina Mood, DDS as Referring Physician (Dentistry) Freddi Starr, MD as Consulting Physician (Pulmonary Disease)  Indicate any recent Medical Services you may have received from other than [facility] providers in the past year (date may be approximate).     Assessment:   This is a routine wellness examination for Mariah Snyder.  Hearing/Vision screen Hearing Screening - Comments:: Patient denied any hearing difficulty.   No hearing aids.  Vision Screening - Comments:: Patient does wear corrective lenses/contacts.  Eye exam done by: [facility] (Dr. Nicoletta Dress)    Dietary issues and exercise activities discussed: Current Exercise Habits: Home exercise routine, Type of exercise: walking, Time (Minutes): 30, Frequency (Times/Week): 7, Weekly Exercise (Minutes/Week): 210, Intensity: Mild, Exercise limited by: respiratory conditions(s)   Goals Addressed             This Visit's Progress    My goal is to be normal again.  Depression Screen    02/14/2022     2:15 PM 04/28/2021   10:06 AM  PHQ 2/9 Scores  PHQ - 2 Score 0 0    Fall Risk    02/14/2022    2:08 PM 10/17/2021   10:45 AM 04/10/2021   10:19 AM  [place] in the past year? '1 1 1  '$ Number falls in past yr: '1 1 1  '$ Injury with Fall? 1 1 0  Risk for fall due to : History of fall(s);Impaired balance/gait    Follow up Falls evaluation completed      FALL RISK PREVENTION PERTAINING TO THE HOME:  Any stairs in or around the home? Yes  If so, are there any without handrails? No  Home free of loose throw rugs in walkways, pet beds, electrical cords, etc? Yes  Adequate lighting in your home to reduce risk of falls? Yes   ASSISTIVE DEVICES UTILIZED TO PREVENT FALLS:  Life alert? No  Use of a cane, walker or w/c? No  Grab bars in the bathroom? No  Shower chair or bench in shower? No  Elevated toilet seat or a handicapped toilet? Yes   TIMED UP AND GO:  Was the test performed? No .  Length of time to ambulate 10 feet: n/a sec.   Appearance of gait: Patient not evaluated for gait during this visit.  Cognitive Function:        02/14/2022    2:20 PM  6CIT Screen  What Year? 0 points  What month? 0 points  What time? 0 points  Count back from 20 0 points  Months in reverse 0 points  Repeat phrase 0 points  Total Score 0 points    Immunizations Immunization History  Administered Date(s) Administered   Fluad Quad(high Dose 65+) 06/23/2021   Influenza, Seasonal, Injecte, Preservative Fre 07/10/2006, 06/06/2009, 08/21/2010, 07/02/2011   Influenza,inj,Quad PF,6+ Mos 07/02/2016, 05/26/2018, 06/09/2019   Influenza-Unspecified 06/25/2014, 07/02/2016, 07/04/2017   Moderna Covid-19 Vaccine Bivalent Booster 29yr & up 07/10/2021   Moderna SARS-COV2 Booster Vaccination 01/27/2021   Moderna Sars-Covid-2 Vaccination 10/31/2019, 11/28/2019, 07/26/2020   Pneumococcal Conjugate-13 12/29/2013   Pneumococcal Polysaccharide-23 04/24/2010   Tdap 06/04/2018   Zoster Recombinat  (Shingrix) 06/20/2017   Zoster, Live 01/13/2008    TDAP status: Up to date  Flu Vaccine status: Up to date  Pneumococcal vaccine status: Up to date  Covid-19 vaccine status: Completed vaccines  Qualifies for Shingles Vaccine? Yes   Zostavax completed No   Shingrix Completed?: Yes  Screening Tests Health Maintenance  Topic Date Due   Hepatitis C Screening  Never done   DEXA SCAN  Never done   INFLUENZA VACCINE  04/17/2022   TETANUS/TDAP  06/04/2028   Pneumonia Vaccine 76 Years old  Completed   COVID-19 Vaccine  Completed   Zoster Vaccines- Shingrix  Completed   HPV VACCINES  Aged Out   COLONOSCOPY (Pts 45-451yrInsurance coverage will need to be confirmed)  Discontinued    Health Maintenance  Health Maintenance Due  Topic Date Due   Hepatitis C Screening  Never done   DEXA SCAN  Never done    Colorectal cancer screening: Type of screening: Colonoscopy. Completed 12/19/2021. Repeat every 0 years  Mammogram status: Completed 2022. Repeat every year  Bone density status: never done; no record  Lung Cancer Screening: (Low Dose CT Chest recommended if Age 233-80ears, 30 pack-year currently smoking OR have quit w/in 15years.) does not qualify.   Lung Cancer  Screening Referral: no  Additional Screening:  Hepatitis C Screening: does qualify; Completed no  Vision Screening: Recommended annual ophthalmology exams for early detection of glaucoma and other disorders of the eye. Is the patient up to date with their annual eye exam?  Yes  Who is the provider or what is the name of the office in which the patient attends annual eye exams? [facility] (Dr. Nicoletta Dress) If pt is not established with a provider, would they like to be referred to a provider to establish care? No .   Dental Screening: Recommended annual dental exams for proper oral hygiene  Community Resource Referral / Chronic Care Management: CRR required this visit?  No   CCM required this visit?  No       Plan:     I have personally reviewed and noted the following in the patient's chart:   Medical and social history Use of alcohol, tobacco or illicit drugs  Current medications and supplements including opioid prescriptions.  Functional ability and status Nutritional status Physical activity Advanced directives List of other physicians Hospitalizations, surgeries, and ER visits in previous 12 months Vitals Screenings to include cognitive, depression, and falls Referrals and appointments  In addition, I have reviewed and discussed with patient certain preventive protocols, quality metrics, and best practice recommendations. A written personalized care plan for preventive services as well as general preventive health recommendations were provided to patient.     Sheral Flow, LPN   2/50/0370   Nurse Notes:  There were no vitals filed for this visit. There is no height or weight on file to calculate BMI. Patient stated that she has no issues with gait or balance; does not use any assistive devices.   Medical screening examination/treatment/procedure(s) were performed by non-physician practitioner and as supervising physician I was immediately available for consultation/collaboration.  I agree with above. Lew Dawes, MD

## 2022-02-14 NOTE — Patient Instructions (Signed)
Mariah Snyder , Thank you for taking time to come for your Medicare Wellness Visit. I appreciate your ongoing commitment to your health goals. Please review the following plan we discussed and let me know if I can assist you in the future.   Screening recommendations/referrals: Colonoscopy: 12/19/2021; no repeat due to age 76: 2022; due every year Bone Density: never done or no record Recommended yearly ophthalmology/optometry visit for glaucoma screening and checkup Recommended yearly dental visit for hygiene and checkup  Vaccinations: Influenza vaccine: 06/23/2021 Pneumococcal vaccine: 04/24/2010, 12/29/2013 Tdap vaccine: 06/04/2018; due every 10 years Shingles vaccine: 06/20/2017, 04/21/2018   Covid-19: 10/31/2019, 11/28/2019, 07/26/2020, 01/27/2021, 07/10/2021  Advanced directives: Yes  Conditions/risks identified: Yes  Next appointment: Please schedule your next Medicare Wellness Visit with your Nurse Health Advisor in 1 year by calling [phone].   Preventive Care 77 Years and Older, Female Preventive care refers to lifestyle choices and visits with your health care provider that can promote health and wellness. What does preventive care include? A yearly physical exam. This is also called an annual well check. Dental exams once or twice a year. Routine eye exams. Ask your health care provider how often you should have your eyes checked. Personal lifestyle choices, including: Daily care of your teeth and gums. Regular physical activity. Eating a healthy diet. Avoiding tobacco and drug use. Limiting alcohol use. Practicing safe sex. Taking low-dose aspirin every day. Taking vitamin and mineral supplements as recommended by your health care provider. What happens during an annual well check? The services and screenings done by your health care provider during your annual well check will depend on your age, overall health, lifestyle risk factors, and family history of  disease. Counseling  Your health care provider may ask you questions about your: Alcohol use. Tobacco use. Drug use. Emotional well-being. Home and relationship well-being. Sexual activity. Eating habits. History of falls. Memory and ability to understand (cognition). Work and work Statistician. Reproductive health. Screening  You may have the following tests or measurements: Height, weight, and BMI. Blood pressure. Lipid and cholesterol levels. These may be checked every 5 years, or more frequently if you are over 57 years old. Skin check. Lung cancer screening. You may have this screening every year starting at age 50 if you have a 30-pack-year history of smoking and currently smoke or have quit within the past 15 years. Fecal occult blood test (FOBT) of the stool. You may have this test every year starting at age 50. Flexible sigmoidoscopy or colonoscopy. You may have a sigmoidoscopy every 5 years or a colonoscopy every 10 years starting at age 56. Hepatitis C blood test. Hepatitis B blood test. Sexually transmitted disease (STD) testing. Diabetes screening. This is done by checking your blood sugar (glucose) after you have not eaten for a while (fasting). You may have this done every 1-3 years. Bone density scan. This is done to screen for osteoporosis. You may have this done starting at age 35. Mammogram. This may be done every 1-2 years. Talk to your health care provider about how often you should have regular mammograms. Talk with your health care provider about your test results, treatment options, and if necessary, the need for more tests. Vaccines  Your health care provider may recommend certain vaccines, such as: Influenza vaccine. This is recommended every year. Tetanus, diphtheria, and acellular pertussis (Tdap, Td) vaccine. You may need a Td booster every 10 years. Zoster vaccine. You may need this after age 48. Pneumococcal 13-valent conjugate (PCV13) vaccine.  One  dose is recommended after age 6. Pneumococcal polysaccharide (PPSV23) vaccine. One dose is recommended after age 77. Talk to your health care provider about which screenings and vaccines you need and how often you need them. This information is not intended to replace advice given to you by your health care provider. Make sure you discuss any questions you have with your health care provider. Document Released: 09/30/2015 Document Revised: 05/23/2016 Document Reviewed: 07/05/2015 Elsevier Interactive Patient Education  2017 [place] Prevention in the Home Falls can cause injuries. They can happen to people of all ages. There are many things you can do to make your home safe and to help prevent falls. What can I do on the outside of my home? Regularly fix the edges of walkways and driveways and fix any cracks. Remove anything that might make you trip as you walk through a door, such as a raised step or threshold. Trim any bushes or trees on the path to your home. Use bright outdoor lighting. Clear any walking paths of anything that might make someone trip, such as rocks or tools. Regularly check to see if handrails are loose or broken. Make sure that both sides of any steps have handrails. Any raised decks and porches should have guardrails on the edges. Have any leaves, snow, or ice cleared regularly. Use sand or salt on walking paths during winter. Clean up any spills in your garage right away. This includes oil or grease spills. What can I do in the bathroom? Use night lights. Install grab bars by the toilet and in the tub and shower. Do not use towel bars as grab bars. Use non-skid mats or decals in the tub or shower. If you need to sit down in the shower, use a plastic, non-slip stool. Keep the floor dry. Clean up any water that spills on the floor as soon as it happens. Remove soap buildup in the tub or shower regularly. Attach bath mats securely with double-sided  non-slip rug tape. Do not have throw rugs and other things on the floor that can make you trip. What can I do in the bedroom? Use night lights. Make sure that you have a light by your bed that is easy to reach. Do not use any sheets or blankets that are too big for your bed. They should not hang down onto the floor. Have a firm chair that has side arms. You can use this for support while you get dressed. Do not have throw rugs and other things on the floor that can make you trip. What can I do in the kitchen? Clean up any spills right away. Avoid walking on wet floors. Keep items that you use a lot in easy-to-reach places. If you need to reach something above you, use a strong step stool that has a grab bar. Keep electrical cords out of the way. Do not use floor polish or wax that makes floors slippery. If you must use wax, use non-skid floor wax. Do not have throw rugs and other things on the floor that can make you trip. What can I do with my stairs? Do not leave any items on the stairs. Make sure that there are handrails on both sides of the stairs and use them. Fix handrails that are broken or loose. Make sure that handrails are as long as the stairways. Check any carpeting to make sure that it is firmly attached to the stairs. Fix any carpet that is loose or  worn. Avoid having throw rugs at the top or bottom of the stairs. If you do have throw rugs, attach them to the floor with carpet tape. Make sure that you have a light switch at the top of the stairs and the bottom of the stairs. If you do not have them, ask someone to add them for you. What else can I do to help prevent falls? Wear shoes that: Do not have high heels. Have rubber bottoms. Are comfortable and fit you well. Are closed at the toe. Do not wear sandals. If you use a stepladder: Make sure that it is fully opened. Do not climb a closed stepladder. Make sure that both sides of the stepladder are locked into place. Ask  someone to hold it for you, if possible. Clearly mark and make sure that you can see: Any grab bars or handrails. First and last steps. Where the edge of each step is. Use tools that help you move around (mobility aids) if they are needed. These include: Canes. Walkers. Scooters. Crutches. Turn on the lights when you go into a dark area. Replace any light bulbs as soon as they burn out. Set up your furniture so you have a clear path. Avoid moving your furniture around. If any of your floors are uneven, fix them. If there are any pets around you, be aware of where they are. Review your medicines with your doctor. Some medicines can make you feel dizzy. This can increase your chance of falling. Ask your doctor what other things that you can do to help prevent falls. This information is not intended to replace advice given to you by your health care provider. Make sure you discuss any questions you have with your health care provider. Document Released: 06/30/2009 Document Revised: 02/09/2016 Document Reviewed: 10/08/2014 Elsevier Interactive Patient Education  2017 [employer].

## 2022-03-06 ENCOUNTER — Other Ambulatory Visit: Payer: Self-pay | Admitting: Internal Medicine

## 2022-03-06 ENCOUNTER — Encounter: Payer: Self-pay | Admitting: Internal Medicine

## 2022-03-06 MED ORDER — OMEPRAZOLE 20 MG PO CPDR: 20.0000 mg | DELAYED_RELEASE_CAPSULE | Freq: Two times a day (BID) | ORAL | 0 refills | Status: DC

## 2022-03-06 MED ORDER — LEVOTHYROXINE SODIUM 112 MCG PO TABS: 112.0000 ug | ORAL_TABLET | Freq: Every day | ORAL | 1 refills | Status: DC

## 2022-03-28 ENCOUNTER — Encounter: Payer: Self-pay | Admitting: Internal Medicine

## 2022-03-29 MED ORDER — FLUCONAZOLE 150 MG PO TABS: 150.0000 mg | ORAL_TABLET | Freq: Once | ORAL | 0 refills | Status: AC

## 2022-04-03 ENCOUNTER — Encounter: Payer: Self-pay | Admitting: Neurology

## 2022-04-06 ENCOUNTER — Ambulatory Visit (INDEPENDENT_AMBULATORY_CARE_PROVIDER_SITE_OTHER): Payer: Medicare Other | Admitting: Podiatry

## 2022-04-06 DIAGNOSIS — M79674 Pain in right toe(s): Secondary | ICD-10-CM | POA: Diagnosis not present

## 2022-04-06 DIAGNOSIS — M79675 Pain in left toe(s): Secondary | ICD-10-CM

## 2022-04-06 DIAGNOSIS — B351 Tinea unguium: Secondary | ICD-10-CM

## 2022-04-06 NOTE — Progress Notes (Unsigned)
Subjective: Chief Complaint  Patient presents with   Nail Problem    Routine foot care     76 year old female presents the office today for concerns of her nails becoming thickened discolored and elongated she cannot trim himself and causing discomfort. She thinks the right big toenail may be growing out.  No swelling redness or any drainage to the toenail sites.  She has no other concerns.  Objective: AAO x3, NAD DP/PT pulses palpable bilaterally, CRT less than 3 seconds Nails are hypertrophic, dystrophic, brittle, discolored, elongated 10.  Most notably the right hallux toenail is the most thickened.  Tenderness nails 1-5 bilaterally. No open lesions or pre-ulcerative lesions are identified today. No pain with calf compression, swelling, warmth, erythema  Assessment: Ingrown toenail, symptomatic onychomycosis  Plan: -All treatment options discussed with the patient including all alternatives, risks, complications.  -Sharply debrided nails x10 without any complications or bleeding.  Discussed urea nail gel to help with the thickening. -Patient encouraged to call the office with any questions, concerns, change in symptoms.   Trula Slade DPM

## 2022-04-16 ENCOUNTER — Telehealth: Payer: Self-pay

## 2022-04-16 MED ORDER — PROPRANOLOL HCL 10 MG PO TABS
10.0000 mg | ORAL_TABLET | Freq: Two times a day (BID) | ORAL | 0 refills | Status: DC
Start: 2022-04-16 — End: 2022-05-08

## 2022-04-16 NOTE — Telephone Encounter (Signed)
Per Dr.Tat 90 day supply of Propranolol 10 mg okay to send in for patient to [facility].

## 2022-04-25 NOTE — Progress Notes (Signed)
Assessment/Plan:    1.  Essential Tremor  -Continue primidone, 50 mg, 3 tablets twice per day.  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  I didn't see it today but told her we can trial cautiously increasing the propranolol, 20 mg twice per day.  She is to watch her blood pressure and pulse closely.  She will let us know if any side effects, including dizziness or lightheadedness.  2.  Possible TIA, March, 2022             -Event was very short-lived (seconds) so it is questionable whether this really represented TIA.  Nonetheless, Dr. Philippa Chester at [facility] worked this up extensively.  MRI and MRA were negative for anything acute.  Zio patch was completed and was unremarkable.  Patient now on aspirin, 81 mg daily.    Subjective:   Mariah Snyder was seen today in follow up for essential tremor.  My previous records were reviewed prior to todays visit.  Patient was in the emergency room at end of April for generalized weakness, to the emergency room physician thought was just a reaction to GI distress from antibiotics.  She reported in the emergency room she was overall frustrated regarding the outcome of a colonoscopy that she felt she did not need.  She expressed that same frustration to her GI doctor and had an appointment May 10.  Patient called me back not long thereafter and stated that she felt that her tremor has been worse ever since her colonoscopy, including leg tremor.  I did not really want to increase her medication, but also had never seen leg tremor in her.  Her propranolol was refilled and she presents today to further discuss.  She reports tremor in both feet.  Its bothersome when driving.  No trouble with ambulating and no tremor when she ambulates.  She did have an MRI of the lumbar spine since last visit, which was essentially  unremarkable.  Current prescribed movement disorder medications: Primidone, 50 mg, 3 tablets twice per day Propranolol, 10 mg twice a day  ALLERGIES:   Allergies  Allergen Reactions   Doxycycline     N/V, diarrhea   Sulfa Antibiotics Other (See Comments)    Causes headaches.     Grass Extracts [Gramineae Pollens] Itching and Other (See Comments)    Runny itchy eyes & nose.   Molds & Smuts Itching and Other (See Comments)    Runny itchy eyes & nose.    CURRENT MEDICATIONS:  Current Meds  Medication Sig   acetaminophen (TYLENOL) 325 MG tablet Take 650 mg by mouth every 6 (six) hours as needed.   albuterol (PROVENTIL) (2.5 MG/3ML) 0.083% nebulizer solution Take 3 mLs (2.5 mg total) by nebulization every 6 (six) hours as needed for wheezing or shortness of breath.   amLODipine (NORVASC) 5 MG tablet Take 1 tablet (5 mg total) by mouth daily.   busPIRone (BUSPAR) 5 MG tablet Take 1 tablet (5 mg total) by mouth 2 (two) times daily. Take one tablet in the morning and 1 tablet at bedtime   Carboxymethylcell-Hypromellose (GENTEAL) 0.25-0.3 % GEL Apply to eye. Apply to each eye at bedtime   carboxymethylcellul-glycerin (REFRESH OPTIVE) 0.5-0.9 % ophthalmic solution Apply to eye.   Cholecalciferol (VITAMIN D3) 50 MCG (2000 UT) capsule Take 2,000 Units by mouth daily. Take  one capsule daily   clobetasol ointment (TEMOVATE) 0.05 % Apply to affected area twice a week as directed   conjugated estrogens (PREMARIN) vaginal cream Place vaginally 2 (two) times a week. Apply 0.5g into vagina 2 x per week   fluticasone-salmeterol (ADVAIR HFA) 230-21 MCG/ACT inhaler Inhale 2 puffs into the lungs 2 (two) times daily. (Patient taking differently: Inhale 2 puffs into the lungs 2 (two) times daily. 2 puffs in the morning and 2 puffs in the evening.)   levothyroxine (SYNTHROID) 112 MCG tablet Take 1 tablet (112 mcg total) by mouth daily.   lisinopril (ZESTRIL) 5 MG tablet Take 1 tablet (5 mg total) by mouth  daily.   Omega-3 Fatty Acids (FISH OIL) 1000 MG CAPS Take 1 capsule by mouth daily.   omeprazole (PRILOSEC) 20 MG capsule Take 1 capsule (20 mg total) by mouth 2 (two) times daily before a meal. Annual appt due in Aug must see provider for future refills   pregabalin (LYRICA) 75 MG capsule Take 1 capsule (75 mg total) by mouth 3 (three) times daily.   primidone (MYSOLINE) 50 MG tablet Take 3 tablets (150 mg total) by mouth 2 (two) times daily.   propranolol (INDERAL) 10 MG tablet Take 1 tablet (10 mg total) by mouth 2 (two) times daily.   simvastatin (ZOCOR) 20 MG tablet Take 1 tablet (20 mg total) by mouth daily.      Objective:    PHYSICAL EXAMINATION:    VITALS:   Vitals:   05/08/22 1040  BP: 124/78  Pulse: 88  Resp: 18  SpO2: 95%  Weight: 154 lb (69.9 kg)  Height: '5\' 4"'$  (1.626 m)     GEN:  The patient appears stated age and is in NAD. HEENT:  Normocephalic, atraumatic.  The mucous membranes are moist.  Cardiovascular: Regular rate rhythm Lungs: Clear to auscultation bilaterally Neck/HEME:  There are no carotid bruits bilaterally.  Head to the right with head tremor, complex.  Head tremor better when neck to the L (same as previous visits)  Neurological examination:  Orientation: The patient is alert and oriented x3. Cranial nerves: There is good facial symmetry. The speech is fluent and clear. Soft palate rises symmetrically and there is no tongue deviation. Hearing is intact to conversational tone. Sensation: Sensation is intact to light touch throughout.  Vibration intact bilateral ankle Motor: Strength is at least antigravity x4. DTR's: 2 at bilateral patella and achilles.  No ankle clonus  Movement examination: Tone: There is normal tone in the UE/LE Abnormal movements: there is head tremor, as above.  There is minimal postural tremor, mild, RUE>LUE.  No leg tremor seen (and no LE clonus) Coordination:  There is no decremation with RAM's Gait and Station: The  patient's stride length is good I have reviewed and interpreted the following labs independently   Chemistry      Component Value Date/Time   NA 137 01/13/2022 1959   K 4.5 01/13/2022 1959   CL 106 01/13/2022 1959   CO2 24 01/13/2022 1959   BUN 25 (H) 01/13/2022 1959   CREATININE 0.80 01/13/2022 1959      Component Value Date/Time   CALCIUM 9.6 01/13/2022 1959   ALKPHOS 119 01/13/2022 1959   AST 24 01/13/2022 1959   ALT 28 01/13/2022 1959   BILITOT 0.3 01/13/2022 1959      Lab Results  Component Value Date   WBC 9.0 01/13/2022   HGB 13.3 01/13/2022   HCT 40.9 01/13/2022   MCV 88.0  01/13/2022   PLT 290 01/13/2022   Lab Results  Component Value Date   TSH 1.87 08/18/2021     Chemistry      Component Value Date/Time   NA 137 01/13/2022 1959   K 4.5 01/13/2022 1959   CL 106 01/13/2022 1959   CO2 24 01/13/2022 1959   BUN 25 (H) 01/13/2022 1959   CREATININE 0.80 01/13/2022 1959      Component Value Date/Time   CALCIUM 9.6 01/13/2022 1959   ALKPHOS 119 01/13/2022 1959   AST 24 01/13/2022 1959   ALT 28 01/13/2022 1959   BILITOT 0.3 01/13/2022 1959         Total time spent on today's visit was 22 minutes, including both face-to-face time and nonface-to-face time.  Time included that spent on review of records (prior notes available to me/labs/imaging if pertinent), discussing treatment and goals, answering patient's questions and coordinating care.  Cc:  Hoyt Koch, MD

## 2022-05-08 ENCOUNTER — Ambulatory Visit (INDEPENDENT_AMBULATORY_CARE_PROVIDER_SITE_OTHER): Payer: Medicare Other | Admitting: Neurology

## 2022-05-08 ENCOUNTER — Encounter: Payer: Self-pay | Admitting: Neurology

## 2022-05-08 VITALS — BP 124/78 | HR 88 | Resp 18 | Ht 64.0 in | Wt 154.0 lb

## 2022-05-08 DIAGNOSIS — G243 Spasmodic torticollis: Secondary | ICD-10-CM | POA: Diagnosis not present

## 2022-05-08 DIAGNOSIS — G250 Essential tremor: Secondary | ICD-10-CM | POA: Diagnosis not present

## 2022-05-08 MED ORDER — PRIMIDONE 50 MG PO TABS: 150.0000 mg | ORAL_TABLET | Freq: Two times a day (BID) | ORAL | 1 refills | Status: DC

## 2022-05-08 MED ORDER — PROPRANOLOL HCL 20 MG PO TABS: 20.0000 mg | ORAL_TABLET | Freq: Two times a day (BID) | ORAL | 1 refills | Status: DC

## 2022-05-08 NOTE — Patient Instructions (Signed)
Increase propranolol to 20 mg twice per day.  Monitor your blood pressure and pulse on this medication.  Continue primidone 50 mg, 3 in the AM and 3 in the evening  Medications don't help head tremor that much and the only thing that will help that is botox.  The physicians and staff at [facility] are committed to providing excellent care. You may receive a survey requesting feedback about your experience at our office. We strive to receive "very good" responses to the survey questions. If you feel that your experience would prevent you from giving the office a "very good " response, please contact our office to try to remedy the situation. We may be reached at [phone]. Thank you for taking the time out of your busy day to complete the survey.

## 2022-05-18 ENCOUNTER — Ambulatory Visit (INDEPENDENT_AMBULATORY_CARE_PROVIDER_SITE_OTHER): Payer: Medicare Other | Admitting: Internal Medicine

## 2022-05-18 ENCOUNTER — Encounter: Payer: Self-pay | Admitting: Internal Medicine

## 2022-05-18 VITALS — BP 122/80 | HR 67 | Temp 98.6°F | Ht 64.0 in | Wt 156.0 lb

## 2022-05-18 DIAGNOSIS — E559 Vitamin D deficiency, unspecified: Secondary | ICD-10-CM

## 2022-05-18 DIAGNOSIS — E063 Autoimmune thyroiditis: Secondary | ICD-10-CM

## 2022-05-18 DIAGNOSIS — Z1231 Encounter for screening mammogram for malignant neoplasm of breast: Secondary | ICD-10-CM | POA: Diagnosis not present

## 2022-05-18 DIAGNOSIS — E538 Deficiency of other specified B group vitamins: Secondary | ICD-10-CM

## 2022-05-18 DIAGNOSIS — E7800 Pure hypercholesterolemia, unspecified: Secondary | ICD-10-CM | POA: Diagnosis not present

## 2022-05-18 DIAGNOSIS — M797 Fibromyalgia: Secondary | ICD-10-CM

## 2022-05-18 DIAGNOSIS — K2100 Gastro-esophageal reflux disease with esophagitis, without bleeding: Secondary | ICD-10-CM

## 2022-05-18 DIAGNOSIS — I10 Essential (primary) hypertension: Secondary | ICD-10-CM

## 2022-05-18 DIAGNOSIS — R200 Anesthesia of skin: Secondary | ICD-10-CM

## 2022-05-18 LAB — CBC
HCT: 38.8 % (ref 36.0–46.0)
Hemoglobin: 12.9 g/dL (ref 12.0–15.0)
MCHC: 33.1 g/dL (ref 30.0–36.0)
MCV: 87 fl (ref 78.0–100.0)
Platelets: 283 10*3/uL (ref 150.0–400.0)
RBC: 4.46 Mil/uL (ref 3.87–5.11)
RDW: 14 % (ref 11.5–15.5)
WBC: 7.6 10*3/uL (ref 4.0–10.5)

## 2022-05-18 LAB — COMPREHENSIVE METABOLIC PANEL
ALT: 13 U/L (ref 0–35)
AST: 15 U/L (ref 0–37)
Albumin: 4 g/dL (ref 3.5–5.2)
Alkaline Phosphatase: 94 U/L (ref 39–117)
BUN: 16 mg/dL (ref 6–23)
CO2: 29 mEq/L (ref 19–32)
Calcium: 9.3 mg/dL (ref 8.4–10.5)
Chloride: 106 mEq/L (ref 96–112)
Creatinine, Ser: 0.89 mg/dL (ref 0.40–1.20)
GFR: 62.63 mL/min (ref >60.00)
Glucose, Bld: 91 mg/dL (ref 70–99)
Potassium: 4.5 mEq/L (ref 3.5–5.1)
Sodium: 140 mEq/L (ref 135–145)
Total Bilirubin: 0.2 mg/dL (ref 0.2–1.2)
Total Protein: 7.4 g/dL (ref 6.0–8.3)

## 2022-05-18 LAB — LIPID PANEL
Cholesterol: 172 mg/dL (ref 0–200)
HDL: 42.3 mg/dL (ref >39.00)
NonHDL: 129.87
Total CHOL/HDL Ratio: 4
Triglycerides: 316 mg/dL — ABNORMAL HIGH (ref 0.0–149.0)
VLDL: 63.2 mg/dL — ABNORMAL HIGH (ref 0.0–40.0)

## 2022-05-18 LAB — LDL CHOLESTEROL, DIRECT: Direct LDL: 76 mg/dL

## 2022-05-18 MED ORDER — OMEPRAZOLE 20 MG PO CPDR: 20.0000 mg | DELAYED_RELEASE_CAPSULE | Freq: Every day | ORAL | 0 refills | Status: DC | PRN

## 2022-05-18 MED ORDER — CLOBETASOL PROPIONATE 0.05 % EX OINT: TOPICAL_OINTMENT | CUTANEOUS | 11 refills | Status: DC

## 2022-05-18 MED ORDER — LEVOTHYROXINE SODIUM 112 MCG PO TABS: 112.0000 ug | ORAL_TABLET | Freq: Every day | ORAL | 3 refills | Status: DC

## 2022-05-18 MED ORDER — SIMVASTATIN 20 MG PO TABS
20.0000 mg | ORAL_TABLET | Freq: Every day | ORAL | 3 refills | Status: DC
Start: 2022-05-18 — End: 2023-07-23

## 2022-05-18 MED ORDER — PREGABALIN 75 MG PO CAPS: 75.0000 mg | ORAL_CAPSULE | Freq: Three times a day (TID) | ORAL | 1 refills | Status: DC

## 2022-05-18 MED ORDER — LISINOPRIL 5 MG PO TABS: 5.0000 mg | ORAL_TABLET | Freq: Every day | ORAL | 3 refills | Status: DC

## 2022-05-18 MED ORDER — AMLODIPINE BESYLATE 5 MG PO TABS: 5.0000 mg | ORAL_TABLET | Freq: Every day | ORAL | 3 refills | Status: DC

## 2022-05-18 MED ORDER — BUSPIRONE HCL 5 MG PO TABS: 5.0000 mg | ORAL_TABLET | Freq: Two times a day (BID) | ORAL | 3 refills | Status: DC

## 2022-05-18 NOTE — Assessment & Plan Note (Signed)
Stable, likely related to lumbar spine. Checking vitamin D and B12 as they have not been checked in some time.

## 2022-05-18 NOTE — Patient Instructions (Signed)
We will check the labs today.  It is okay to take omeprazole

## 2022-05-18 NOTE — Progress Notes (Signed)
   Subjective:   Patient ID: Mariah Snyder, female    DOB: 1946-06-08, 76 y.o.   MRN: [id number]  HPI The patient is a 76 YO female coming in for follow up.  Review of Systems  Constitutional: Negative.   HENT: Negative.    Eyes: Negative.   Respiratory:  Negative for cough, chest tightness and shortness of breath.   Cardiovascular:  Negative for chest pain, palpitations and leg swelling.  Gastrointestinal:  Negative for abdominal distention, abdominal pain, constipation, diarrhea, nausea and vomiting.  Musculoskeletal:  Positive for arthralgias and myalgias.  Skin: Negative.   Neurological: Negative.   Psychiatric/Behavioral: Negative.      Objective:  Physical Exam Constitutional:      Appearance: She is well-developed.  HENT:     Head: Normocephalic and atraumatic.  Cardiovascular:     Rate and Rhythm: Normal rate and regular rhythm.  Pulmonary:     Effort: Pulmonary effort is normal. No respiratory distress.     Breath sounds: Normal breath sounds. No wheezing or rales.  Abdominal:     General: Bowel sounds are normal. There is no distension.     Palpations: Abdomen is soft.     Tenderness: There is no abdominal tenderness. There is no rebound.  Musculoskeletal:        General: Tenderness present. No swelling.     Cervical back: Normal range of motion.  Skin:    General: Skin is warm and dry.  Neurological:     Mental Status: She is alert and oriented to person, place, and time.     Coordination: Coordination normal.     Vitals:   05/18/22 1601  BP: 122/80  Pulse: 67  Temp: 98.6 F (37 C)  TempSrc: Oral  SpO2: 97%  Weight: 156 lb (70.8 kg)  Height: '5\' 4"'$  (1.626 m)    Assessment & Plan:

## 2022-05-18 NOTE — Assessment & Plan Note (Signed)
Improved and using omeprazole 20 mg daily prn only now.

## 2022-05-18 NOTE — Assessment & Plan Note (Signed)
Checking TSH and free T4 and adjust synthroid 112 mcg daily as needed.

## 2022-05-18 NOTE — Assessment & Plan Note (Signed)
Taking amlodipine 5 mg daily and lisinopril 5 mg daily. Checking CMP and CBC and adjust as needed.

## 2022-05-18 NOTE — Assessment & Plan Note (Signed)
Some worse lately. Asking for non-medicine options discussed water aerobics and she will see about this.

## 2022-05-18 NOTE — Assessment & Plan Note (Signed)
Checking lipid panel and adjust simvastatin 20 mg daily as needed.  

## 2022-05-22 LAB — T4, FREE: Free T4: 1.07 ng/dL (ref 0.60–1.60)

## 2022-05-22 LAB — VITAMIN D 25 HYDROXY (VIT D DEFICIENCY, FRACTURES): VITD: 38.19 ng/mL (ref 30.00–100.00)

## 2022-05-22 LAB — TSH: TSH: 0.87 u[IU]/mL (ref 0.35–5.50)

## 2022-05-22 LAB — VITAMIN B12: Vitamin B-12: 314 pg/mL (ref 211–911)

## 2022-06-04 ENCOUNTER — Other Ambulatory Visit: Payer: Self-pay | Admitting: Pulmonary Disease

## 2022-06-04 DIAGNOSIS — J4540 Moderate persistent asthma, uncomplicated: Secondary | ICD-10-CM

## 2022-06-04 NOTE — Telephone Encounter (Signed)
Patient states [facility] is needing a 90 day supply of advair with 3 refills sent in.   Please advise.

## 2022-06-12 ENCOUNTER — Ambulatory Visit (INDEPENDENT_AMBULATORY_CARE_PROVIDER_SITE_OTHER): Payer: Medicare Other | Admitting: Podiatry

## 2022-06-12 DIAGNOSIS — M79675 Pain in left toe(s): Secondary | ICD-10-CM | POA: Diagnosis not present

## 2022-06-12 DIAGNOSIS — M79674 Pain in right toe(s): Secondary | ICD-10-CM

## 2022-06-12 DIAGNOSIS — B351 Tinea unguium: Secondary | ICD-10-CM | POA: Diagnosis not present

## 2022-06-12 NOTE — Progress Notes (Unsigned)
Subjective: Chief Complaint  Patient presents with   Nail Problem    Routine foot care, Nail trim    Vertical line right hlallux Nails peel  76 year old female presents the office today for concerns of her nails becoming thickened discolored and elongated she cannot trim himself and causing discomfort. She thinks the right big toenail may be growing out.  No swelling redness or any drainage to the toenail sites.  She has no other concerns.  Objective: AAO x3, NAD DP/PT pulses palpable bilaterally, CRT less than 3 seconds Nails are hypertrophic, dystrophic, brittle, discolored, elongated 10.  Most notably the right hallux toenail is the most thickened.  Tenderness nails 1-5 bilaterally. No open lesions or pre-ulcerative lesions are identified today. No pain with calf compression, swelling, warmth, erythema  Assessment: Ingrown toenail, symptomatic onychomycosis  Plan: -All treatment options discussed with the patient including all alternatives, risks, complications.  -Sharply debrided nails x10 without any complications or bleeding.  Discussed urea nail gel to help with the thickening. -Patient encouraged to call the office with any questions, concerns, change in symptoms.   Trula Slade DPM

## 2022-07-09 ENCOUNTER — Telehealth: Payer: Self-pay | Admitting: Anesthesiology

## 2022-07-09 NOTE — Telephone Encounter (Signed)
Patient having trouble finding a dentist that is familiar with patiens with tremors. Wanting to know if Dr. Carles Collet had any recommendations I did let patient know she was out of town until tomorrow

## 2022-07-09 NOTE — Telephone Encounter (Signed)
Patient requesting a call back. Has questions for Dr Tat about her tremors.

## 2022-07-11 ENCOUNTER — Ambulatory Visit: Payer: Medicare Other | Admitting: Pulmonary Disease

## 2022-07-25 ENCOUNTER — Encounter: Payer: Self-pay | Admitting: Pulmonary Disease

## 2022-07-25 ENCOUNTER — Ambulatory Visit (INDEPENDENT_AMBULATORY_CARE_PROVIDER_SITE_OTHER): Payer: Medicare Other | Admitting: Pulmonary Disease

## 2022-07-25 VITALS — BP 118/70 | HR 66 | Ht 64.0 in | Wt 153.0 lb

## 2022-07-25 DIAGNOSIS — J4540 Moderate persistent asthma, uncomplicated: Secondary | ICD-10-CM

## 2022-07-25 DIAGNOSIS — Z2911 Encounter for prophylactic immunotherapy for respiratory syncytial virus (RSV): Secondary | ICD-10-CM

## 2022-07-25 MED ORDER — MONTELUKAST SODIUM 10 MG PO TABS: 10.0000 mg | ORAL_TABLET | Freq: Every day | ORAL | 3 refills | Status: DC

## 2022-07-25 MED ORDER — AREXVY 120 MCG/0.5ML IM SUSR: 0.5000 mL | Freq: Once | INTRAMUSCULAR | 0 refills | Status: AC

## 2022-07-25 MED ORDER — ALBUTEROL SULFATE (2.5 MG/3ML) 0.083% IN NEBU: 2.5000 mg | INHALATION_SOLUTION | Freq: Four times a day (QID) | RESPIRATORY_TRACT | 6 refills | Status: DC | PRN

## 2022-07-25 MED ORDER — FLUTICASONE-SALMETEROL 230-21 MCG/ACT IN AERO: INHALATION_SPRAY | RESPIRATORY_TRACT | 3 refills | Status: DC

## 2022-07-25 NOTE — Progress Notes (Signed)
Synopsis: Referred in January 2022 for shortness of breath  Subjective:   PATIENT ID: Mariah Snyder: female DOB: Oct 21, 1945, MRN: [id number]  HPI  Chief Complaint  Patient presents with   Follow-up    F/U for asthma. States the albuterol solution has helped with her symptoms.    [name] is a 76 year old woman, former smoker with asthma and GERD who returns to pulmonary clinic for asthma follow up.   She is using advair 2 puffs twice daily and as needed albuterol nebulizer treatments before she sings with the [facility] which has helped. Overall she has been doing well. She complains of fall allergies bothering her sinus, throat and breathing a bit.  She has received her covid and flu vaccines already. She is interested in the RSV vaccine.  01/03/22 She had c-scope on 4/4 and noted to have aspiration event during the procedure. She called our clinic 4/6 reporting productive cough with blood tinged sputum. She was treated with augmentin for 7 days. Chest radiograph showed left lower lobe infiltrates.  She continues to have cough with yellowish sputum and no reports of blood tinged sputum.   Her breathing prior to the procedure was getting better as she was walking 2 miles per day multiple times per week.   She is using advair 2 puffs twice daily.   OV 07/19/21 She reports her shortness of breath is better since last visit with increasing her advair inhaler to 230-63mg 2 puffs twice daily at last visit. She continues to have some dyspnea and wheezing with exertion on walking uphill or on an incline.   She denies any changes with her breathing due to the cold weather.  OV 03/13/21 She has had an increase in shortness of breath since last visit with activity.  She stopped using Fluticasone nasal spray as this gave her nausea which led to vomiting.  She is now using simple saline nasal spray.  She denies any further sinus drainage issues.  OV 10/2020: She had a  dentist appointment who reported she had concerning findings for thrush with white patches and redness at the right posterior area of her throat. There was concern that this was related to the spiriva inhaler she was recently started on. She had an appointment with BWyn Quaker NP on 11/01/20 for evaluation of potential medication reaction.   Prior to her dentist raising concern for thrush, she had no issues of sore throat or oral irritation. She has been on advair for many years and consistently rinses her mouth out and has not previously had issues with thrush.   The spiriva inhaler was stopped and she was treated with 2-3 days of nystatin.   She has continued on the advair and denies any issues with her breathing currently. She is not experiencing the wheezing she previously complained of.   She complains of cough and post-nasal sinus drainage. The cough is worse in the mornings with a whitish sputum.   OV 10/10/20 She reports having progressive shortness of breath and wheezing since September. She notices these symptoms when she walks. She reports being diagnosed with asthma in 2014 when she initially developed the shortness of breath and wheezing. She has been on advair HFA since then with adquate control of her symptoms.   She tested positive for covid on 10/03/20 which led to fatigue and worsening of her shortness of breath. She also reports sinus congestion and post nasal drip since covid.   Her asthma triggers  include seasonal allergies worse in the fall and spring. Strong perfumes, colonges and cigarette smoke also make her breathing worse. She has not required prednisone or antibiotics in the past year.   She has positional sleep apnea based on previous sleep testing and trys to avoid sleeping on her back.   She was exposed to second hand smoke growing up as her father smoked in the home. She also smoked socially in the past having cigarettes when drinking.   Her mother and youngest  brother have asthma.   Past Medical History:  Diagnosis Date   Anemia    Anxiety    Arthritis    Asthma    Fibromyalgia    Gallstones    GERD (gastroesophageal reflux disease)    Hyperlipidemia    Hypertension    Hypothyroidism    Kidney stones    Peptic ulcer    Rectal prolapse    Sleep apnea    Urinary tract infection      Family History  Problem Relation Age of Onset   Asthma Mother    Tremor Mother    Aneurysm Father        AAA   Heart attack Brother        Died from Heart Attack at age 44   Heart disease Brother    Other Brother        Post Polio Syndrome   Heart disease Paternal Grandmother    Tremor Child    Colon cancer Neg Hx    Esophageal cancer Neg Hx    Pancreatic cancer Neg Hx    Colon polyps Neg Hx      Social History   Socioeconomic History   Marital status: Widowed    Spouse name: Not on file   Number of children: 2   Years of education: Not on file   Highest education level: Not on file  Occupational History   Occupation: retired    Comment: worked in radiology  Tobacco Use   Smoking status: Former    Types: Cigarettes    Passive exposure: Past   Smokeless tobacco: Never  Scientific laboratory technician Use: Never used  Substance and Sexual Activity   Alcohol use: Yes    Comment: Has a beer or glass of wine occasionally   Drug use: Never   Sexual activity: Not Currently  Other Topics Concern   Not on file  Social History Narrative   Right Handed    Lives in a two story home   Social Determinants of Health   Financial Resource Strain: Low Risk  (02/14/2022)   Overall Financial Resource Strain (CARDIA)    Difficulty of Paying Living Expenses: Not hard at all  Food Insecurity: No Food Insecurity (02/14/2022)   Hunger Vital Sign    Worried About Running Out of Food in the Last Year: Never true    [place] in the Last Year: Never true  Transportation Needs: No Transportation Needs (02/14/2022)   PRAPARE - Radiographer, therapeutic (Medical): No    Lack of Transportation (Non-Medical): No  Physical Activity: Sufficiently Active (02/14/2022)   Exercise Vital Sign    Days of Exercise per Week: 7 days    Minutes of Exercise per Session: 30 min  Stress: No Stress Concern Present (02/14/2022)   [place]    Feeling of Stress : Not at all  Social Connections: Moderately Integrated (02/14/2022)  Social Licensed conveyancer [NHANES]    Frequency of Communication with Friends and Family: More than three times a week    Frequency of Social Gatherings with Friends and Family: More than three times a week    Attends Religious Services: 1 to 4 times per year    Active Member of [employer] or Organizations: Yes    Attends Archivist Meetings: 1 to 4 times per year    Marital Status: Widowed  Intimate Partner Violence: Not At Risk (02/14/2022)   Humiliation, Afraid, Rape, and Kick questionnaire    Fear of Current or Ex-Partner: No    Emotionally Abused: No    Physically Abused: No    Sexually Abused: No     Allergies  Allergen Reactions   Doxycycline     N/V, diarrhea   Sulfa Antibiotics Other (See Comments)    Causes headaches.     Grass Extracts [Gramineae Pollens] Itching and Other (See Comments)    Runny itchy eyes & nose.   Molds & Smuts Itching and Other (See Comments)    Runny itchy eyes & nose.     Outpatient Medications Prior to Visit  Medication Sig Dispense Refill   acetaminophen (TYLENOL) 325 MG tablet Take 650 mg by mouth every 6 (six) hours as needed.     amLODipine (NORVASC) 5 MG tablet Take 1 tablet (5 mg total) by mouth daily. 90 tablet 3   ASPIRIN 81 PO Take by mouth.     busPIRone (BUSPAR) 5 MG tablet Take 1 tablet (5 mg total) by mouth 2 (two) times daily. Take one tablet in the morning and 1 tablet at bedtime 180 tablet 3   Carboxymethylcell-Hypromellose (GENTEAL) 0.25-0.3 % GEL Apply to eye. Apply to  each eye at bedtime     carboxymethylcellul-glycerin (REFRESH OPTIVE) 0.5-0.9 % ophthalmic solution Apply to eye.     Cholecalciferol (VITAMIN D3) 50 MCG (2000 UT) capsule Take 2,000 Units by mouth daily. Take one capsule daily     clobetasol ointment (TEMOVATE) 0.05 % Apply to affected area twice a week as directed 90 g 11   conjugated estrogens (PREMARIN) vaginal cream Place vaginally 2 (two) times a week. Apply 0.5g into vagina 2 x per week 90 g 3   levothyroxine (SYNTHROID) 112 MCG tablet Take 1 tablet (112 mcg total) by mouth daily. 90 tablet 3   lisinopril (ZESTRIL) 5 MG tablet Take 1 tablet (5 mg total) by mouth daily. 90 tablet 3   mirabegron ER (MYRBETRIQ) 50 MG TB24 tablet Take 50 mg by mouth daily.     Omega-3 Fatty Acids (FISH OIL) 1000 MG CAPS Take 1 capsule by mouth daily.     omeprazole (PRILOSEC) 20 MG capsule Take 1 capsule (20 mg total) by mouth daily as needed. 90 capsule 0   pregabalin (LYRICA) 75 MG capsule Take 1 capsule (75 mg total) by mouth 3 (three) times daily. 270 capsule 1   primidone (MYSOLINE) 50 MG tablet Take 3 tablets (150 mg total) by mouth 2 (two) times daily. 540 tablet 1   propranolol (INDERAL) 20 MG tablet Take 1 tablet (20 mg total) by mouth 2 (two) times daily. 180 tablet 1   simvastatin (ZOCOR) 20 MG tablet Take 1 tablet (20 mg total) by mouth daily. 90 tablet 3   ADVAIR HFA 230-21 MCG/ACT inhaler INHALE 2 PUFFS BY MOUTH TWICE A DAY AND RINSE MOUTH AFTER USE 3 each 3   albuterol (PROVENTIL) (2.5 MG/3ML) 0.083% nebulizer solution Take 3  mLs (2.5 mg total) by nebulization every 6 (six) hours as needed for wheezing or shortness of breath. 150 mL 1   No facility-administered medications prior to visit.   Review of Systems  Constitutional:  Negative for chills, fever, malaise/fatigue and weight loss.  HENT:  Negative for congestion, sinus pain and sore throat.   Eyes: Negative.   Respiratory:  Negative for cough, hemoptysis, sputum production, shortness of  breath and wheezing.   Cardiovascular:  Negative for chest pain, palpitations, orthopnea, claudication and leg swelling.  Gastrointestinal:  Negative for abdominal pain, heartburn, nausea and vomiting.  Genitourinary: Negative.   Musculoskeletal:  Negative for joint pain and myalgias.  Skin:  Negative for rash.  Neurological:  Negative for weakness.  Endo/Heme/Allergies: Negative.   Psychiatric/Behavioral: Negative.       Objective:   Vitals:   07/25/22 0905  BP: 118/70  Pulse: 66  SpO2: 97%  Weight: 153 lb (69.4 kg)  Height: '5\' 4"'$  (1.626 m)   Physical Exam Constitutional:      Appearance: Normal appearance. She is normal weight.  HENT:     Head: Normocephalic and atraumatic.     Nose: Nose normal.     Mouth/Throat:     Mouth: Mucous membranes are moist.  Eyes:     General: No scleral icterus.    Conjunctiva/sclera: Conjunctivae normal.  Cardiovascular:     Rate and Rhythm: Normal rate and regular rhythm.     Pulses: Normal pulses.     Heart sounds: Normal heart sounds.  Pulmonary:     Effort: Pulmonary effort is normal.     Breath sounds: Normal breath sounds.  Musculoskeletal:     Right lower leg: No edema.     Left lower leg: No edema.  Skin:    General: Skin is warm and dry.  Neurological:     General: No focal deficit present.     CBC    Component Value Date/Time   WBC 7.6 05/18/2022 1639   RBC 4.46 05/18/2022 1639   HGB 12.9 05/18/2022 1639   HCT 38.8 05/18/2022 1639   PLT 283.0 05/18/2022 1639   MCV 87.0 05/18/2022 1639   MCH 28.6 01/13/2022 1959   MCHC 33.1 05/18/2022 1639   RDW 14.0 05/18/2022 1639   LYMPHSABS 2.8 01/13/2022 1959   MONOABS 0.8 01/13/2022 1959   EOSABS 0.2 01/13/2022 1959   BASOSABS 0.0 01/13/2022 1959   Chest imaging: CXR 12/21/20 Cardiomediastinal silhouette within normal limits.   No interlobular septal thickening.   Mixed reticulonodular opacities in the lower lungs, worst on the left. No pleural effusion. No  pneumothorax. Coarsened interstitial markings, with no comparison.  CXR 10/03/2017 Radiographically clear lungs.  No pleural effusion or pneumothorax.  Cardiomediastinal silhouette unremarkable.   PFT:    Latest Ref Rng & Units 03/13/2021   12:49 PM  PFT Results  FVC-Pre L 1.96   FVC-Predicted Pre % 74   FVC-Post L 1.96   FVC-Predicted Post % 74   Pre FEV1/FVC % % 82   Post FEV1/FCV % % 83   FEV1-Pre L 1.61   FEV1-Predicted Pre % 81   FEV1-Post L 1.64   DLCO uncorrected ml/min/mmHg 14.41   DLCO UNC% % 78   DLCO corrected ml/min/mmHg 14.41   DLCO COR %Predicted % 78   DLVA Predicted % 101   TLC L 3.84   TLC % Predicted % 78   RV % Predicted % 81   PFT 2022: Mild restrictive defect  Assessment & Plan:   Moderate persistent asthma without complication - Plan: albuterol (PROVENTIL) (2.5 MG/3ML) 0.083% nebulizer solution, fluticasone-salmeterol (ADVAIR HFA) 230-21 MCG/ACT inhaler, montelukast (SINGULAIR) 10 MG tablet  Need for RSV vaccination - Plan: RSV vaccine recomb adjuvanted (AREXVY) 120 MCG/0.5ML injection  Discussion: Mariah Snyder is a 76 year old woman, former smoker with asthma and GERD who returns to pulmonary clinic for asthma follow up.  She is to continue Advair 230-25 MCG 2 puffs twice daily and as needed albuterol nebulizer treatment.   We will start her on montelukast '10mg'$  daily for allergies.   She is up to date with covid and flu vaccines. Prescription provided for RSV vaccine.   Follow up in 1 year.  Freda Jackson, MD [facility] Office: [phone]   Current Outpatient Medications:    acetaminophen (TYLENOL) 325 MG tablet, Take 650 mg by mouth every 6 (six) hours as needed., Disp: , Rfl:    amLODipine (NORVASC) 5 MG tablet, Take 1 tablet (5 mg total) by mouth daily., Disp: 90 tablet, Rfl: 3   ASPIRIN 81 PO, Take by mouth., Disp: , Rfl:    busPIRone (BUSPAR) 5 MG tablet, Take 1 tablet (5 mg total) by mouth 2 (two) times  daily. Take one tablet in the morning and 1 tablet at bedtime, Disp: 180 tablet, Rfl: 3   Carboxymethylcell-Hypromellose (GENTEAL) 0.25-0.3 % GEL, Apply to eye. Apply to each eye at bedtime, Disp: , Rfl:    carboxymethylcellul-glycerin (REFRESH OPTIVE) 0.5-0.9 % ophthalmic solution, Apply to eye., Disp: , Rfl:    Cholecalciferol (VITAMIN D3) 50 MCG (2000 UT) capsule, Take 2,000 Units by mouth daily. Take one capsule daily, Disp: , Rfl:    clobetasol ointment (TEMOVATE) 0.05 %, Apply to affected area twice a week as directed, Disp: 90 g, Rfl: 11   conjugated estrogens (PREMARIN) vaginal cream, Place vaginally 2 (two) times a week. Apply 0.5g into vagina 2 x per week, Disp: 90 g, Rfl: 3   levothyroxine (SYNTHROID) 112 MCG tablet, Take 1 tablet (112 mcg total) by mouth daily., Disp: 90 tablet, Rfl: 3   lisinopril (ZESTRIL) 5 MG tablet, Take 1 tablet (5 mg total) by mouth daily., Disp: 90 tablet, Rfl: 3   mirabegron ER (MYRBETRIQ) 50 MG TB24 tablet, Take 50 mg by mouth daily., Disp: , Rfl:    montelukast (SINGULAIR) 10 MG tablet, Take 1 tablet (10 mg total) by mouth at bedtime., Disp: 90 tablet, Rfl: 3   Omega-3 Fatty Acids (FISH OIL) 1000 MG CAPS, Take 1 capsule by mouth daily., Disp: , Rfl:    omeprazole (PRILOSEC) 20 MG capsule, Take 1 capsule (20 mg total) by mouth daily as needed., Disp: 90 capsule, Rfl: 0   pregabalin (LYRICA) 75 MG capsule, Take 1 capsule (75 mg total) by mouth 3 (three) times daily., Disp: 270 capsule, Rfl: 1   primidone (MYSOLINE) 50 MG tablet, Take 3 tablets (150 mg total) by mouth 2 (two) times daily., Disp: 540 tablet, Rfl: 1   propranolol (INDERAL) 20 MG tablet, Take 1 tablet (20 mg total) by mouth 2 (two) times daily., Disp: 180 tablet, Rfl: 1   RSV vaccine recomb adjuvanted (AREXVY) 120 MCG/0.5ML injection, Inject 0.5 mLs into the muscle once for 1 dose., Disp: 0.5 mL, Rfl: 0   simvastatin (ZOCOR) 20 MG tablet, Take 1 tablet (20 mg total) by mouth daily., Disp: 90 tablet,  Rfl: 3   albuterol (PROVENTIL) (2.5 MG/3ML) 0.083% nebulizer solution, Take 3 mLs (2.5 mg total)  by nebulization every 6 (six) hours as needed for wheezing or shortness of breath., Disp: 150 mL, Rfl: 6   fluticasone-salmeterol (ADVAIR HFA) 230-21 MCG/ACT inhaler, INHALE 2 PUFFS BY MOUTH TWICE A DAY AND RINSE MOUTH AFTER USE, Disp: 3 each, Rfl: 3

## 2022-07-25 NOTE — Patient Instructions (Addendum)
Continue advair inhaler 2 puffs twice daily - rinse mouth out after each use  Continue as needed albuterol nebulizer treatments every 6 hours  Start montelukast '10mg'$  daily for allergies  Follow up in 1 year, call if needed sooner

## 2022-08-28 ENCOUNTER — Encounter: Payer: Self-pay | Admitting: Neurology

## 2022-08-28 ENCOUNTER — Other Ambulatory Visit: Payer: Self-pay | Admitting: Neurology

## 2022-08-28 DIAGNOSIS — G243 Spasmodic torticollis: Secondary | ICD-10-CM

## 2022-08-28 DIAGNOSIS — G250 Essential tremor: Secondary | ICD-10-CM

## 2022-09-13 ENCOUNTER — Ambulatory Visit: Payer: Medicare Other | Admitting: Podiatry

## 2022-09-14 ENCOUNTER — Encounter: Payer: Self-pay | Admitting: Internal Medicine

## 2022-09-14 ENCOUNTER — Ambulatory Visit (INDEPENDENT_AMBULATORY_CARE_PROVIDER_SITE_OTHER): Payer: Medicare Other | Admitting: Internal Medicine

## 2022-09-14 VITALS — BP 130/80 | HR 70 | Temp 97.9°F | Ht 64.0 in | Wt 151.0 lb

## 2022-09-14 DIAGNOSIS — K623 Rectal prolapse: Secondary | ICD-10-CM | POA: Diagnosis not present

## 2022-09-14 DIAGNOSIS — K5900 Constipation, unspecified: Secondary | ICD-10-CM | POA: Insufficient documentation

## 2022-09-14 DIAGNOSIS — N3281 Overactive bladder: Secondary | ICD-10-CM

## 2022-09-14 DIAGNOSIS — K5904 Chronic idiopathic constipation: Secondary | ICD-10-CM

## 2022-09-14 DIAGNOSIS — G250 Essential tremor: Secondary | ICD-10-CM

## 2022-09-14 MED ORDER — FLUCONAZOLE 150 MG PO TABS: 150.0000 mg | ORAL_TABLET | ORAL | 2 refills | Status: DC

## 2022-09-14 NOTE — Progress Notes (Signed)
   Subjective:   Patient ID: Mariah Snyder, female    DOB: Feb 07, 1946, 76 y.o.   MRN: [id number]  HPI The patient is a 76 YO female coming in for rectal prolapse. Worse after eye surgery. Other concerns as well.   Review of Systems  Constitutional: Negative.   HENT: Negative.    Eyes: Negative.   Respiratory:  Negative for cough, chest tightness and shortness of breath.   Cardiovascular:  Negative for chest pain, palpitations and leg swelling.  Gastrointestinal:  Positive for constipation and diarrhea. Negative for abdominal distention, abdominal pain, nausea and vomiting.       Rectal prolapse  Musculoskeletal: Negative.   Skin: Negative.   Neurological:  Positive for tremors.  Psychiatric/Behavioral: Negative.      Objective:  Physical Exam Constitutional:      Appearance: She is well-developed.  HENT:     Head: Normocephalic and atraumatic.  Cardiovascular:     Rate and Rhythm: Normal rate and regular rhythm.  Pulmonary:     Effort: Pulmonary effort is normal. No respiratory distress.     Breath sounds: Normal breath sounds. No wheezing or rales.  Abdominal:     General: Bowel sounds are normal. There is no distension.     Palpations: Abdomen is soft.     Tenderness: There is no abdominal tenderness. There is no rebound.  Musculoskeletal:     Cervical back: Normal range of motion.  Skin:    General: Skin is warm and dry.  Neurological:     Mental Status: She is alert and oriented to person, place, and time.     Coordination: Coordination normal.     Comments: Tremor stable     Vitals:   09/14/22 1511  BP: 130/80  Pulse: 70  Temp: 97.9 F (36.6 C)  TempSrc: Oral  SpO2: 98%  Weight: 151 lb (68.5 kg)  Height: '5\' 4"'$  (1.626 m)    Assessment & Plan:

## 2022-09-14 NOTE — Assessment & Plan Note (Signed)
Recent anaesthesia medications caused severe constipation which led to worsening rectal prolapse. She is advised to take stool softener daily. Counseled she likely has overflow diarrhea with liquid stools alternating with limited to no BM for days. She was advised not to take fiber by urologist and then tried and had explosive stools for 2 weeks. We discussed possible cleanse with miralax to empty colon and then start docusate daily.

## 2022-09-14 NOTE — Assessment & Plan Note (Signed)
Due to essential tremor her dentist was unable to get x-rays and she wishes to switch. Given advice on dental clinics which might have better expertise.

## 2022-09-14 NOTE — Assessment & Plan Note (Signed)
Has recurrent yeast infection causing burning. Rx diflucan today #3 with refills she uses rarely.

## 2022-09-14 NOTE — Assessment & Plan Note (Signed)
Referral to colorectal surgeon to assess options. She has seen urology through [facility] and they prescribed her myrbetriq for OAB. Suspect she is having some overflow diarrhea with constipation for days and then liquid stool unpredictable. We will address that.

## 2022-09-14 NOTE — Patient Instructions (Addendum)
[facility] clinic go to [email or web address]  Try docusate daily to help you go to the bathroom easier.   We will get you in with the surgeon and have sent in the yeast infection medicine.

## 2022-09-18 ENCOUNTER — Ambulatory Visit (INDEPENDENT_AMBULATORY_CARE_PROVIDER_SITE_OTHER): Payer: Medicare Other | Admitting: Podiatry

## 2022-09-18 VITALS — BP 119/61

## 2022-09-18 DIAGNOSIS — M79674 Pain in right toe(s): Secondary | ICD-10-CM

## 2022-09-18 DIAGNOSIS — M79675 Pain in left toe(s): Secondary | ICD-10-CM | POA: Diagnosis not present

## 2022-09-18 DIAGNOSIS — B351 Tinea unguium: Secondary | ICD-10-CM | POA: Diagnosis not present

## 2022-09-19 NOTE — Progress Notes (Signed)
Subjective: Chief Complaint  Patient presents with   Nail Problem    Rm 14 RFC Bilateral nail trim 1-5. Pt states right 2nd toe nail is curving more than usual and wants to know if anything can be done about it.     77 year old female with the above concerns.  She is nails are thickened elongated she not able to trim them herself.  She has noticed that the second on the right foot is becoming more ingrown does not causing pain and there is no swelling redness or drainage or any signs of infections that she reports.  Her granddaughter tried to trim her nails but they were too thick.  Objective: AAO x3, NAD DP/PT pulses palpable bilaterally, CRT less than 3 seconds Nails are hypertrophic, dystrophic, brittle, discolored, elongated 10.  Most notably the right hallux toenail is the most thickened.  There is a what appears to be a superficial vertical line present in the nail.  Tenderness nails 1-5 bilaterally. No open lesions or pre-ulcerative lesions are identified today.  Incurvation of the second digit nails bilaterally right side worse than left without any edema, erythema or signs of infection. No pain with calf compression, swelling, warmth, erythema  Assessment: Ingrown toenail, symptomatic onychomycosis  Plan: -All treatment options discussed with the patient including all alternatives, risks, complications.  -Sharply debrided nails x10 without any complications or bleeding.  We discussed partial nail avulsions but is currently not causing any pain and no signs of infection I would recommend holding off on this and recommend routine debridement however if symptoms change they can reconsider. -Patient encouraged to call the office with any questions, concerns, change in symptoms.   Trula Slade DPM

## 2022-10-26 ENCOUNTER — Encounter: Payer: Self-pay | Admitting: Neurology

## 2022-10-26 ENCOUNTER — Other Ambulatory Visit: Payer: Self-pay

## 2022-10-26 MED ORDER — PRIMIDONE 50 MG PO TABS: 150.0000 mg | ORAL_TABLET | Freq: Two times a day (BID) | ORAL | 1 refills | Status: DC

## 2022-10-31 ENCOUNTER — Ambulatory Visit: Payer: Medicare Other | Attending: Physician Assistant | Admitting: Physical Therapy

## 2022-10-31 ENCOUNTER — Other Ambulatory Visit: Payer: Self-pay

## 2022-10-31 ENCOUNTER — Encounter: Payer: Self-pay | Admitting: Physical Therapy

## 2022-10-31 DIAGNOSIS — M6281 Muscle weakness (generalized): Secondary | ICD-10-CM | POA: Diagnosis present

## 2022-10-31 DIAGNOSIS — R293 Abnormal posture: Secondary | ICD-10-CM | POA: Insufficient documentation

## 2022-10-31 DIAGNOSIS — R279 Unspecified lack of coordination: Secondary | ICD-10-CM | POA: Insufficient documentation

## 2022-10-31 NOTE — Therapy (Signed)
OUTPATIENT PHYSICAL THERAPY FEMALE PELVIC EVALUATION   Patient Name: Mariah Snyder MRN: [id number] DOB:1946-04-04, 77 y.o., female Today's Date: 10/31/2022  END OF SESSION:  PT End of Session - 10/31/22 1023     Visit Number 1    Date for PT Re-Evaluation 01/23/23    Authorization Type medicare    PT Start Time 1020    PT Stop Time 1100    PT Time Calculation (min) 40 min    Activity Tolerance Patient tolerated treatment well    Behavior During Therapy WFL for tasks assessed/performed             Past Medical History:  Diagnosis Date   Anemia    Anxiety    Arthritis    Asthma    Fibromyalgia    Gallstones    GERD (gastroesophageal reflux disease)    Hyperlipidemia    Hypertension    Hypothyroidism    Kidney stones    Peptic ulcer    Rectal prolapse    Sleep apnea    Urinary tract infection    Past Surgical History:  Procedure Laterality Date   bunyionectomy Left    CHOLECYSTECTOMY     ELBOW ARTHROSCOPY Left    LAPAROSCOPIC ASSISTED VAGINAL HYSTERECTOMY     SHOULDER ARTHROSCOPY Right    TONSILLECTOMY     Patient Active Problem List   Diagnosis Date Noted   Rectal prolapse 09/14/2022   Constipation 09/14/2022   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/2021   Left hip pain 10/12/2021   High cholesterol 03/07/2021   Moderate persistent asthma without complication 0000000   Bereavement 02/06/2019   Bilateral temporomandibular joint pain 02/18/2018   Right leg numbness 05/21/2017   Squamous blepharitis of upper and lower eyelids of both eyes 05/21/2017   Meibomian gland dysfunction (MGD) of upper and lower lids of both eyes 02/20/2017   Chronic left-sided low back pain with left-sided sciatica 12/31/2016   OAB (overactive bladder) 08/29/2015   Hallux limitus 01/24/2015   Balance problems 0000000   Lichen sclerosus 0000000   Healthcare maintenance 01/12/2013   Essential tremor 07/08/2012   Gastro-esophageal reflux disease with esophagitis 07/02/2011    Fibromyalgia 05/27/2011   Keratoconjunctivitis sicca (Contin[place]) 05/10/2011   Senile nuclear sclerosis 05/10/2011   Tear film insufficiency 05/10/2011   Essential (primary) hypertension 02/26/2011   Degenerative spinal arthritis 08/28/2000   Hashimoto's thyroiditis 08/28/1980    PCP: Hoyt Koch, MD   REFERRING PROVIDER: Major, Enis Slipper, PA-C  REFERRING DIAG: R15.9 (ICD-10-CM) - Full incontinence of feces   THERAPY DIAG:  Muscle weakness (generalized) - Plan: PT plan of care cert/re-cert  Abnormal posture - Plan: PT plan of care cert/re-cert  Unspecified lack of coordination - Plan: PT plan of care cert/re-cert  Rationale for Evaluation and Treatment: Rehabilitation  ONSET DATE: since colonoscopy in April 2023  SUBJECTIVE:  SUBJECTIVE STATEMENT: I had PT for this two years ago and it got better and had no leakage, then began again since colonoscopy.  Currently fecal incontinence of bowel movements, without awareness, which occurs about 3 times a week.  Has abdominal pain and urgency without passing a bowel movement.  On mirilax helps.  I got pneumonia from aspirating during anesthesia when I had the colonoscopy Fluid intake: Yes: not much water, I forget to drink    PAIN:  Are you having pain? Yes NPRS scale: 3/10 before bowel movements Pain location:  abdomen  Pain type: cramping Pain description: intermittent   Aggravating factors: before bowel movements Relieving factors: takes a while for a few hours  PRECAUTIONS: None  WEIGHT BEARING RESTRICTIONS: No  FALLS:  Has patient fallen in last 6 months? Yes. 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, bible study, takes care of granddaughter  PLOF: Independent  PATIENT GOALS: not have incontinence  PERTINENT HISTORY:  history of HTN, arthritis, asthma, chronic constipation, hypothyroidism (most recent TFT's wnl), sleep apnea, essential tremor, fibromyalgia, overactive bladder, hysterectomy, and cholecystectomy Sexual abuse: No  BOWEL MOVEMENT: Pain with bowel movement: Yes Type of bowel movement:Type (Bristol Stool Scale) soft or diarrhea, Frequency daily and then skip a couple days, and Strain Yes sometimes Fully empty rectum: No Leakage: Yes: 3/week Pads: Yes: sometimes Fiber supplement: No  URINATION: Pain with urination: No Fully empty bladder: No Stream: Strong Urgency: No Frequency: medication to control  Leakage:  none Pads: No  INTERCOURSE:   PREGNANCY: Vaginal deliveries 2 Tearing Yes: both   PROLAPSE: None   OBJECTIVE:   DIAGNOSTIC FINDINGS:    PATIENT SURVEYS:    PFIQ-7   COGNITION: Overall cognitive status: Within functional limits for tasks assessed     SENSATION: Light touch:  Proprioception:   MUSCLE LENGTH: Hamstrings: Right 80 deg; Left 80 deg   LUMBAR SPECIAL TESTS:    FUNCTIONAL TESTS:    GAIT:  Comments: slow due to tremors  POSTURE: rounded shoulders and increased thoracic kyphosis  PELVIC ALIGNMENT:  LUMBARAROM/PROM:  A/PROM A/PROM  eval  Flexion   Extension   Right lateral flexion   Left lateral flexion   Right rotation   Left rotation    (Blank rows = not tested)  LOWER EXTREMITY ROM:  Passive ROM Right eval Left eval  Hip flexion 75% 90%  Hip extension    Hip abduction    Hip adduction    Hip internal rotation    Hip external rotation 75% 90%  Knee flexion    Knee extension    Ankle dorsiflexion    Ankle plantarflexion    Ankle inversion    Ankle eversion     (Blank rows = not tested)  LOWER EXTREMITY MMT:  MMT Right eval Left eval  Hip flexion    Hip  extension    Hip abduction    Hip adduction    Hip internal rotation    Hip external rotation    Knee flexion    Knee extension    Ankle dorsiflexion    Ankle plantarflexion    Ankle inversion    Ankle eversion     PALPATION:   General                  External Perineal Exam no anal wink present, hemorrhoids  Internal Pelvic Floor co-contracting with gluteals and diminished external anal sphincter  Patient confirms identification and approves PT to assess internal pelvic floor and treatment Yes  PELVIC MMT:   MMT eval  Vaginal   Internal Anal Sphincter   External Anal Sphincter 1/5  Puborectalis 3/5  Diastasis Recti   (Blank rows = not tested)        TONE: low  PROLAPSE: no  TODAY'S TREATMENT:                                                                                                                              DATE: 10/31/22  EVAL    PATIENT EDUCATION:  Education details: POC and biofeedback information Person educated: Patient Education method: Explanation Education comprehension: verbalized understanding  HOME EXERCISE PROGRAM:   ASSESSMENT:  CLINICAL IMPRESSION: Patient is a 77 y.o. female who was seen today for physical therapy evaluation and treatment for fecal incontinence.  Pt had flare up since getting a colonoscopy last year.  Pt has diminished external anal sphincter strength and no anal wink reflex noted.  Pt also has posture abnormalities as noted above.  Pt has complicated medical history as mentioned in notes above.  She has difficulty engaging the abdominal muscles and decreased coordination of pelvic floor and core.  Pt will benefit from skilled PT to work on improved strength and coordination for improving bowel control.  OBJECTIVE IMPAIRMENTS: decreased activity tolerance, decreased coordination, decreased endurance, decreased ROM, decreased strength, impaired flexibility, impaired sensation, impaired tone,  postural dysfunction, and pain.   ACTIVITY LIMITATIONS: continence and toileting  PARTICIPATION LIMITATIONS: community activity  PERSONAL FACTORS: 3+ comorbidities: history of HTN, arthritis, asthma, chronic constipation, hypothyroidism (most recent TFT's wnl), sleep apnea, essential tremor, fibromyalgia, overactive bladder, hysterectomy, and cholecystectomy  are also affecting patient's functional outcome.   REHAB POTENTIAL: Excellent  CLINICAL DECISION MAKING: Evolving/moderate complexity  EVALUATION COMPLEXITY: Moderate   GOALS: Goals reviewed with patient? Yes  SHORT TERM GOALS: Target date: 11/28/22  Pt will be ind with toileting techniques Baseline: Goal status: INITIAL  2.  Pt will be ind with initial HEP Baseline:  Goal status: INITIAL  3.  Pt will be able to isolate external anal sphincter Baseline:  Goal status: INITIAL   LONG TERM GOALS: Target date: 01/23/23  Pt will be independent with advanced HEP to maintain improvements made throughout therapy  Baseline:  Goal status: INITIAL  2.  Pt will report 75% reduction of pain due to improvements in posture, strength, and muscle length  Baseline: 3/10 before bowel movements Goal status: INITIAL  3.  Pt will have 75% reduced leakage during a typical week.  Baseline:  Goal status: INITIAL  4.  Pt will have more solid stool for improved emptying Baseline:  Goal status: INITIAL    PLAN:  PT FREQUENCY: 1x/week  PT DURATION: 12 weeks  PLANNED INTERVENTIONS: Therapeutic exercises, Therapeutic activity, Neuromuscular re-education, Balance training, Gait training, Patient/Family education, Self Care,  Joint mobilization, Dry Needling, Electrical stimulation, Cryotherapy, Moist heat, scar mobilization, Taping, Traction, Ultrasound, Biofeedback, Manual therapy, and Re-evaluation  PLAN FOR NEXT SESSION: biofeedback for EAS next and work on breathing and transversus abdominus activation   [employer],  PT 10/31/2022, 2:06 PM

## 2022-11-09 NOTE — Progress Notes (Unsigned)
Assessment/Plan:    1.  Tremor  -Discussed dystonic nature of her head tremor again and that she really would likely not get good control without Botox.  She is not interested in Botox.  -She has become more frustrated with leg and hand tremor, which certainly may be dystonic in nature as well, especially since it is not responding well to primidone/propranolol.  I encouraged her to get a second opinion and she was agreeable.  -not interested in dbs/surgical interventions  -discussed the new clinical trial for Botox for hand tremor, but she does not want to explore that.  That is for essential tremor.  2.  Possible TIA, March, 2022             -Event was very short-lived (seconds) so it is questionable whether this really represented TIA.  Nonetheless, [facility] Philippa [facility] at [facility] worked this up extensively.  MRI and MRA were negative for anything acute.  Zio patch was completed and was unremarkable.  Patient now on aspirin, 81 mg daily.  3.  Gait spasticity  -Patient has had a workup for this.  She had an MRI in 2020 of her cervical spine at [facility] and an MRI of her lumbar spine in 2023.  Symptoms started in about 2018. Workup nonrevealing  Subjective:   Mariah Snyder was seen today in follow up for essential tremor.  My previous records were reviewed prior to todays visit.  Last visit, we cautiously increased her propranolol to see if it would help her complaints of leg and foot tremor (not seen on exam last visit).  I told her to watch her blood pressure closely.  She continues to c/o tremor in her hands and legs - "I was driving someone yesterday and they thought that something was wrong with my car but it was my leg shaking."  She also c/o falls and states that it is due to tremor.  Fell 3 times this month.  Having back pain and started lyrica for that.  Started initially by Dr. Philippa Chester.    Current prescribed movement disorder medications: Primidone, 50 mg, 3 tablets twice per day Propranolol, 20 mg  twice per day (increased)  ALLERGIES:   Allergies  Allergen Reactions   Doxycycline     N/V, diarrhea   Sulfa Antibiotics Other (See Comments)    Causes headaches.     Grass Extracts [Gramineae Pollens] Itching and Other (See Comments)    Runny itchy eyes & nose.   Molds & Smuts Itching and Other (See Comments)    Runny itchy eyes & nose.    CURRENT MEDICATIONS:  Current Meds  Medication Sig   acetaminophen (TYLENOL) 325 MG tablet Take 650 mg by mouth every 6 (six) hours as needed.   albuterol (PROVENTIL) (2.5 MG/3ML) 0.083% nebulizer solution Take 3 mLs (2.5 mg total) by nebulization every 6 (six) hours as needed for wheezing or shortness of breath.   amLODipine (NORVASC) 5 MG tablet Take 1 tablet (5 mg total) by mouth daily.   ASPIRIN 81 PO Take by mouth.   busPIRone (BUSPAR) 5 MG tablet Take 1 tablet (5 mg total) by mouth 2 (two) times daily. Take one tablet in the morning and 1 tablet at bedtime   Carboxymethylcell-Hypromellose (GENTEAL) 0.25-0.3 % GEL Apply to eye. Apply to each eye at bedtime   carboxymethylcellul-glycerin (REFRESH OPTIVE) 0.5-0.9 % ophthalmic solution Apply to eye.   Cholecalciferol (VITAMIN D3) 50 MCG (2000 UT) capsule Take 2,000 Units by mouth daily. Take  one capsule daily   clobetasol ointment (TEMOVATE) 0.05 % Apply to affected area twice a week as directed   conjugated estrogens (PREMARIN) vaginal cream Place vaginally 2 (two) times a week. Apply 0.5g into vagina 2 x per week   fluconazole (DIFLUCAN) 150 MG tablet Take 1 tablet (150 mg total) by mouth every 3 (three) days.   fluticasone-salmeterol (ADVAIR HFA) 230-21 MCG/ACT inhaler INHALE 2 PUFFS BY MOUTH TWICE A DAY AND RINSE MOUTH AFTER USE   levothyroxine (SYNTHROID) 112 MCG tablet Take 1 tablet (112 mcg total) by mouth daily.   lisinopril (ZESTRIL) 5 MG tablet Take 1 tablet (5 mg total) by mouth daily.   mirabegron ER (MYRBETRIQ) 50 MG TB24 tablet Take 50 mg by mouth daily.   montelukast  (SINGULAIR) 10 MG tablet Take 1 tablet (10 mg total) by mouth at bedtime.   Omega-3 Fatty Acids (FISH OIL) 1000 MG CAPS Take 1 capsule by mouth daily.   omeprazole (PRILOSEC) 20 MG capsule Take 1 capsule (20 mg total) by mouth daily as needed.   pregabalin (LYRICA) 75 MG capsule Take 1 capsule (75 mg total) by mouth 3 (three) times daily.   simvastatin (ZOCOR) 20 MG tablet Take 1 tablet (20 mg total) by mouth daily.   [DISCONTINUED] primidone (MYSOLINE) 50 MG tablet Take 3 tablets (150 mg total) by mouth 2 (two) times daily.   [DISCONTINUED] propranolol (INDERAL) 20 MG tablet TAKE ONE TABLET BY MOUTH TWICE A DAY * NOTE NEW STRENGTH *      Objective:    PHYSICAL EXAMINATION:    VITALS:   Vitals:   11/13/22 0909  BP: 139/82  Pulse: 82  SpO2: 97%  Weight: 152 lb (68.9 kg)  Height: '5\' 4"'$  (1.626 m)   GEN:  The patient appears stated age and is in NAD. HEENT:  Normocephalic, atraumatic.  The mucous membranes are moist.  Cardiovascular: Regular rate rhythm Lungs: Clear to auscultation bilaterally Neck/HEME:  There are no carotid bruits bilaterally.  Head to the right with head tremor, complex.  Head tremor better when neck to the L (same as previous visits)  Neurological examination:  Orientation: The patient is alert and oriented x3. Cranial nerves: There is good facial symmetry. The speech is fluent and clear. Soft palate rises symmetrically and there is no tongue deviation. Hearing is intact to conversational tone. Sensation: Sensation is intact to light touch throughout.  Vibration intact bilateral ankle Motor: Strength is at least antigravity x4. DTR's: 2 at bilateral patella and achilles.  No ankle clonus (same as previous)  Movement examination: Tone: There is normal tone in the UE/LE Abnormal movements: there is head tremor, as above.  There is minimal postural tremor, mild, RUE>LUE.  Coordination:  There is no decremation with RAM's Gait and Station: The patient's gait is  wide based and a bit spastic I have reviewed and interpreted the following labs independently   Chemistry      Component Value Date/Time   NA 140 05/18/2022 1639   K 4.5 05/18/2022 1639   CL 106 05/18/2022 1639   CO2 29 05/18/2022 1639   BUN 16 05/18/2022 1639   CREATININE 0.89 05/18/2022 1639      Component Value Date/Time   CALCIUM 9.3 05/18/2022 1639   ALKPHOS 94 05/18/2022 1639   AST 15 05/18/2022 1639   ALT 13 05/18/2022 1639   BILITOT 0.2 05/18/2022 1639      Lab Results  Component Value Date   WBC 7.6 05/18/2022   HGB  12.9 05/18/2022   HCT 38.8 05/18/2022   MCV 87.0 05/18/2022   PLT 283.0 05/18/2022   Lab Results  Component Value Date   TSH 0.87 05/18/2022     Chemistry      Component Value Date/Time   NA 140 05/18/2022 1639   K 4.5 05/18/2022 1639   CL 106 05/18/2022 1639   CO2 29 05/18/2022 1639   BUN 16 05/18/2022 1639   CREATININE 0.89 05/18/2022 1639      Component Value Date/Time   CALCIUM 9.3 05/18/2022 1639   ALKPHOS 94 05/18/2022 1639   AST 15 05/18/2022 1639   ALT 13 05/18/2022 1639   BILITOT 0.2 05/18/2022 1639         Total time spent on today's visit was 33 minutes, including both face-to-face time and nonface-to-face time.  Time included that spent on review of records (prior notes available to me/labs/imaging if pertinent), discussing treatment and goals, answering patient's questions and coordinating care.  Cc:  Hoyt Koch, MD

## 2022-11-13 ENCOUNTER — Ambulatory Visit (INDEPENDENT_AMBULATORY_CARE_PROVIDER_SITE_OTHER): Payer: Medicare Other | Admitting: Neurology

## 2022-11-13 ENCOUNTER — Encounter: Payer: Self-pay | Admitting: Neurology

## 2022-11-13 DIAGNOSIS — G243 Spasmodic torticollis: Secondary | ICD-10-CM

## 2022-11-13 DIAGNOSIS — G250 Essential tremor: Secondary | ICD-10-CM

## 2022-11-13 MED ORDER — PROPRANOLOL HCL 20 MG PO TABS: ORAL_TABLET | ORAL | 1 refills | Status: DC

## 2022-11-13 MED ORDER — PRIMIDONE 50 MG PO TABS: 150.0000 mg | ORAL_TABLET | Freq: Two times a day (BID) | ORAL | 1 refills | Status: DC

## 2022-11-13 NOTE — Patient Instructions (Signed)
Good to see you!  We will send a referral to [facility].  The physicians and staff at [facility] are committed to providing excellent care. You may receive a survey requesting feedback about your experience at our office. We strive to receive "very good" responses to the survey questions. If you feel that your experience would prevent you from giving the office a "very good " response, please contact our office to try to remedy the situation. We may be reached at [phone]. Thank you for taking the time out of your busy day to complete the survey.

## 2022-11-26 ENCOUNTER — Encounter: Payer: Self-pay | Admitting: Neurology

## 2022-11-26 NOTE — Telephone Encounter (Signed)
Pt called in wanting to speak with [facility] regarding this message.

## 2022-11-27 NOTE — Telephone Encounter (Signed)
Patient is calling in stating she would like to go over what Dr.Moore said in the appointment that may not have been documented, states she was bashing Dr.Tat and is really wanting to discuss what was all discussed at her appointment. Advised the patient that the turn around time for responses is 24-48 hours, patient verbalized understanding but wanted to express that she is concerned.

## 2022-11-30 NOTE — Telephone Encounter (Signed)
Patient is calling in asking if Vikki Ports would be able to call her back, was supposed to hear back on Wednesday but hasn't heard anything.

## 2022-11-30 NOTE — Progress Notes (Unsigned)
Virtual Visit Via Video       Consent was obtained for video visit:  Yes.   Answered questions that patient had about telehealth interaction:  Yes.   I discussed the limitations, risks, security and privacy concerns of performing an evaluation and management service by telemedicine. I also discussed with the patient that there may be a patient responsible charge related to this service. The patient expressed understanding and agreed to proceed.  Pt location: Home Physician Location: office Name of referring provider:  Hoyt Koch, * I connected with Mariah Snyder at patients initiation/request on 12/03/2022 at  3:00 PM EDT by video enabled telemedicine application and verified that I am speaking with the correct person using two identifiers. Pt MRN:  [id number] Pt DOB:  05/16/1946 Video Participants:  Mariah Snyder;    Assessment/Plan:    1.  Tremor   -Discussed dystonic nature of her head tremor again and that she really would likely not get good control without Botox.  She is not interested in Botox.  -She has become more frustrated with leg and hand tremor, which certainly may be dystonic in nature as well, especially since it is not responding well to primidone/propranolol.   -She would like to take all the propranolol at night to see if it helps the fatigue.  I do not have an objection to that.  She will change the propranolol to 40 mg at bedtime  -She would like to trial to increase the primidone to see if it helps.  I am not sure will, but I am happy to have her try.  She will increase to primidone, 50 mg, 4 tablets twice per day.  -not interested in dbs right now but may be interested in focused ultrasound in the future.  -Has had kidney stones so really is not a good candidate for topiramate.  -discussed the new clinical trial for Botox for hand tremor, but she does not want to explore that.  That is for essential tremor.  -pt has been seen by Dr. Philippa Chester [facility]) and by  Dr. Crecencio Mc (movement at [facility]).  She felt she did not have a good experience at [facility].  2.  Possible TIA, March, 2022             -Event was very short-lived (seconds) so it is questionable whether this really represented TIA.  Nonetheless, Dr. Philippa Chester at [facility] worked this up extensively.  MRI and MRA were negative for anything acute.  Zio patch was completed and was unremarkable.  Patient now on aspirin, 81 mg daily.  3.  Gait spasticity  -Patient has had a workup for this.  She had an MRI in 2020 of her cervical spine at [facility] and an MRI of her lumbar spine in 2023.  Symptoms started in about 2018. Workup nonrevealing  Subjective:   Mariah Snyder was seen today in follow up for essential tremor.  My previous records were reviewed prior to todays visit.  Last visit, we decided to go ahead and get a second opinion at [facility].  She is on 150 mg twice per day of primidone and already on propranolol.  She did not want to pursue surgical interventions and did not want to pursue the Botox clinical trial for essential tremor.  Medical records from [place] are reviewed.  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  They discussed the treatment we had previously discussed, but also  discussed putting ice packs on her arm and it looks like they referred her to occupational therapy.  Patient reports today that she just did not have a good experience.  Patient would like to trial and increase the primidone.  She states that the propranolol is making her a bit draggy and fatigued.  She is having more tremor.  Current prescribed movement disorder medications: Primidone, 50 mg, 3 tablets twice per day Propranolol, 20 mg twice per day  Lyrica 75 mg twice daily.  (on for back pain)  ALLERGIES:   Allergies  Allergen Reactions   Doxycycline     N/V, diarrhea   Sulfa Antibiotics Other (See Comments)    Causes headaches.     Grass Extracts [Gramineae  Pollens] Itching and Other (See Comments)    Runny itchy eyes & nose.   Molds & Smuts Itching and Other (See Comments)    Runny itchy eyes & nose.    CURRENT MEDICATIONS:  No outpatient medications have been marked as taking for the 12/03/22 encounter (Video Visit) with Khristina Janota, Eustace Quail, DO.      Objective:    PHYSICAL EXAMINATION:    VITALS:   There were no vitals filed for this visit.  GEN:  The patient appears stated age and is in NAD. HEENT:  Normocephalic, atraumatic.   Neck/HEME:   Head to the right with head tremor, complex.   Neurological examination:  Orientation: The patient is alert and oriented x3. Cranial nerves: There is good facial symmetry. The speech is fluent and clear.  Hearing is intact to conversational tone. Motor: Strength is at least antigravity x4.  Movement examination: Abnormal movements: there is head tremor, as above.  There is minimal postural tremor, mild, RUE>LUE.  Coordination: Not tested today Gait and Station: Not tested today I have reviewed and interpreted the following labs independently   Chemistry      Component Value Date/Time   NA 140 05/18/2022 1639   K 4.5 05/18/2022 1639   CL 106 05/18/2022 1639   CO2 29 05/18/2022 1639   BUN 16 05/18/2022 1639   CREATININE 0.89 05/18/2022 1639      Component Value Date/Time   CALCIUM 9.3 05/18/2022 1639   ALKPHOS 94 05/18/2022 1639   AST 15 05/18/2022 1639   ALT 13 05/18/2022 1639   BILITOT 0.2 05/18/2022 1639      Lab Results  Component Value Date   WBC 7.6 05/18/2022   HGB 12.9 05/18/2022   HCT 38.8 05/18/2022   MCV 87.0 05/18/2022   PLT 283.0 05/18/2022   Lab Results  Component Value Date   TSH 0.87 05/18/2022     Chemistry      Component Value Date/Time   NA 140 05/18/2022 1639   K 4.5 05/18/2022 1639   CL 106 05/18/2022 1639   CO2 29 05/18/2022 1639   BUN 16 05/18/2022 1639   CREATININE 0.89 05/18/2022 1639      Component Value Date/Time   CALCIUM 9.3  05/18/2022 1639   ALKPHOS 94 05/18/2022 1639   AST 15 05/18/2022 1639   ALT 13 05/18/2022 1639   BILITOT 0.2 05/18/2022 1639      Follow up Instructions      -I discussed the assessment and treatment plan with the patient. The patient was provided an opportunity to ask questions and all were answered. The patient agreed with the plan and demonstrated an understanding of the instructions.   The patient was advised to call back or seek  an in-person evaluation if the symptoms worsen or if the condition fails to improve as anticipated.     Alonza Bogus, DO   Cc:  Hoyt Koch, MD

## 2022-12-03 ENCOUNTER — Telehealth (INDEPENDENT_AMBULATORY_CARE_PROVIDER_SITE_OTHER): Payer: Medicare Other | Admitting: Neurology

## 2022-12-03 ENCOUNTER — Encounter: Payer: Self-pay | Admitting: Neurology

## 2022-12-03 DIAGNOSIS — G250 Essential tremor: Secondary | ICD-10-CM | POA: Diagnosis not present

## 2022-12-03 MED ORDER — PROPRANOLOL HCL 20 MG PO TABS: 40.0000 mg | ORAL_TABLET | Freq: Every day | ORAL | 1 refills | Status: DC

## 2022-12-03 MED ORDER — PRIMIDONE 50 MG PO TABS: 200.0000 mg | ORAL_TABLET | Freq: Two times a day (BID) | ORAL | 1 refills | Status: DC

## 2022-12-03 NOTE — Patient Instructions (Signed)
Week 1: Take propranolol 40 mg all at night  Week 2: Increase primidone 50 mg, 4 tablets in the morning and 3 tablets at bedtime Continue the propranolol, 40 mg at bedtime  Week 3 and beyond Take primidone, 50 mg, 4 tablets in the morning and 4 tablets at bedtime Continue the propranolol, 40 mg at bedtime

## 2022-12-11 ENCOUNTER — Encounter: Payer: Self-pay | Admitting: Physical Therapy

## 2022-12-11 ENCOUNTER — Ambulatory Visit: Payer: Medicare Other | Attending: Physician Assistant | Admitting: Physical Therapy

## 2022-12-11 DIAGNOSIS — M6281 Muscle weakness (generalized): Secondary | ICD-10-CM | POA: Insufficient documentation

## 2022-12-11 DIAGNOSIS — R279 Unspecified lack of coordination: Secondary | ICD-10-CM | POA: Insufficient documentation

## 2022-12-11 DIAGNOSIS — R293 Abnormal posture: Secondary | ICD-10-CM | POA: Diagnosis present

## 2022-12-11 NOTE — Therapy (Signed)
OUTPATIENT PHYSICAL THERAPY FEMALE PELVIC TREATMENT   Patient Name: Mariah Snyder MRN: [id number] DOB:11-17-1945, 77 y.o., female Today's Date: 12/11/2022  END OF SESSION:  PT End of Session - 12/11/22 1515     Visit Number 2    Date for PT Re-Evaluation 01/23/23    Authorization Type medicare    PT Start Time 1516    PT Stop Time 1600    PT Time Calculation (min) 44 min    Activity Tolerance Patient tolerated treatment well    Behavior During Therapy WFL for tasks assessed/performed              Past Medical History:  Diagnosis Date   Anemia    Anxiety    Arthritis    Asthma    Fibromyalgia    Gallstones    GERD (gastroesophageal reflux disease)    Hyperlipidemia    Hypertension    Hypothyroidism    Kidney stones    Peptic ulcer    Rectal prolapse    Sleep apnea    Urinary tract infection    Past Surgical History:  Procedure Laterality Date   bunyionectomy Left    CHOLECYSTECTOMY     ELBOW ARTHROSCOPY Left    LAPAROSCOPIC ASSISTED VAGINAL HYSTERECTOMY     SHOULDER ARTHROSCOPY Right    TONSILLECTOMY     Patient Active Problem List   Diagnosis Date Noted   Rectal prolapse 09/14/2022   Constipation 09/14/2022   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/2021   Left hip pain 10/12/2021   High cholesterol 03/07/2021   Moderate persistent asthma without complication 0000000   Bereavement 02/06/2019   Bilateral temporomandibular joint pain 02/18/2018   Right leg numbness 05/21/2017   Squamous blepharitis of upper and lower eyelids of both eyes 05/21/2017   Meibomian gland dysfunction (MGD) of upper and lower lids of both eyes 02/20/2017   Chronic left-sided low back pain with left-sided sciatica 12/31/2016   OAB (overactive bladder) 08/29/2015   Hallux limitus 01/24/2015   Balance problems 0000000   Lichen sclerosus 0000000   Healthcare maintenance 01/12/2013   Essential tremor 07/08/2012   Gastro-esophageal reflux disease with esophagitis 07/02/2011    Fibromyalgia 05/27/2011   Keratoconjunctivitis sicca (Novinger) 05/10/2011   Senile nuclear sclerosis 05/10/2011   Tear film insufficiency 05/10/2011   Essential (primary) hypertension 02/26/2011   Degenerative spinal arthritis 08/28/2000   Hashimoto's thyroiditis 08/28/1980    PCP: Hoyt Koch, MD   REFERRING PROVIDER: Major, Enis Slipper, PA-C  REFERRING DIAG: R15.9 (ICD-10-CM) - Full incontinence of feces   THERAPY DIAG:  Muscle weakness (generalized)  Abnormal posture  Unspecified lack of coordination  Rationale for Evaluation and Treatment: Rehabilitation  ONSET DATE: since colonoscopy in April 2023  SUBJECTIVE:  SUBJECTIVE STATEMENT: I cannot eat certain foods.  I am on miralax but cannot take much fiber.  Salad really causes explosive bowel movements.  I had PT for this two years ago and it got better and had no leakage, then began again since colonoscopy.  Currently fecal incontinence of bowel movements, without awareness, which occurs about 3 times a week.  Has abdominal pain and urgency without passing a bowel movement.  On mirilax helps.  I got pneumonia from aspirating during anesthesia when I had the colonoscopy Fluid intake: Yes: not much water, I forget to drink    PAIN:  Are you having pain? Yes NPRS scale: 3/10 before bowel movements Pain location:  abdomen  Pain type: cramping Pain description: intermittent   Aggravating factors: before bowel movements Relieving factors: takes a while for a few hours  PRECAUTIONS: None  WEIGHT BEARING RESTRICTIONS: No  FALLS:  Has patient fallen in last 6 months? Yes. 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, bible study, takes care of granddaughter  PLOF: Independent  PATIENT GOALS: not have incontinence  PERTINENT HISTORY:  history of HTN, arthritis, asthma, chronic constipation, hypothyroidism (most recent TFT's wnl), sleep apnea, essential tremor, fibromyalgia, overactive bladder, hysterectomy, and cholecystectomy Sexual abuse: No  BOWEL MOVEMENT: Pain with bowel movement: Yes Type of bowel movement:Type (Bristol Stool Scale) soft or diarrhea, Frequency daily and then skip a couple days, and Strain Yes sometimes Fully empty rectum: No Leakage: Yes: 3/week Pads: Yes: sometimes Fiber supplement: No  URINATION: Pain with urination: No Fully empty bladder: No Stream: Strong Urgency: No Frequency: medication to control  Leakage:  none Pads: No  INTERCOURSE:   PREGNANCY: Vaginal deliveries 2 Tearing Yes: both   PROLAPSE: None   OBJECTIVE:   DIAGNOSTIC FINDINGS:    PATIENT SURVEYS:    PFIQ-7   COGNITION: Overall cognitive status: Within functional limits for tasks assessed     SENSATION: Light touch:  Proprioception:   MUSCLE LENGTH: Hamstrings: Right 80 deg; Left 80 deg   LUMBAR SPECIAL TESTS:    FUNCTIONAL TESTS:    GAIT:  Comments: slow due to tremors  POSTURE: rounded shoulders and increased thoracic kyphosis  PELVIC ALIGNMENT:  LUMBARAROM/PROM:  A/PROM A/PROM  eval  Flexion   Extension   Right lateral flexion   Left lateral flexion   Right rotation   Left rotation    (Blank rows = not tested)  LOWER EXTREMITY ROM:  Passive ROM Right eval Left eval  Hip flexion 75% 90%  Hip extension    Hip abduction    Hip adduction    Hip internal rotation    Hip external rotation 75% 90%  Knee flexion    Knee extension    Ankle dorsiflexion    Ankle plantarflexion    Ankle inversion    Ankle eversion     (Blank rows = not tested)  LOWER EXTREMITY MMT:  MMT Right eval Left eval  Hip flexion     Hip extension    Hip abduction    Hip adduction    Hip internal rotation    Hip external rotation    Knee flexion    Knee extension    Ankle dorsiflexion    Ankle plantarflexion    Ankle inversion    Ankle eversion     PALPATION:   General  External Perineal Exam no anal wink present, hemorrhoids                             Internal Pelvic Floor co-contracting with gluteals and diminished external anal sphincter  Patient confirms identification and approves PT to assess internal pelvic floor and treatment Yes  PELVIC MMT:   MMT eval  Vaginal   Internal Anal Sphincter   External Anal Sphincter 1/5  Puborectalis 3/5  Diastasis Recti   (Blank rows = not tested)        TONE: low  PROLAPSE: no  TODAY'S TREATMENT:                                                                                                                              DATE: 12/11/22  Coordination of breathing and core - exhale and TC to left low back to prevent QL and paraspinals tightening and causing pain.  Pt able to do with support under low back Rotation of thoracic spine supine and side lying Hip rotation stretch Yoga block with core activation hip ER  Yoga ball with hip neutral core activation Press on thighs core activation  Manual: Lumbar paraspinals and bil QL STM and trigger point release Trigger Point Dry-Needling  Treatment instructions: Expect mild to moderate muscle soreness. S/S of pneumothorax if dry needled over a lung field, and to seek immediate medical attention should they occur. Patient verbalized understanding of these instructions and education.  Patient Consent Given: Yes Education handout provided: Yes Muscles treated: lumbar multifidi Electrical stimulation performed: No Parameters: N/A Treatment response/outcome: increased soft tissue length and twitch response  PATIENT EDUCATION:  Education details: POC and biofeedback information Person educated:  Patient Education method: Explanation Education comprehension: verbalized understanding  HOME EXERCISE PROGRAM: Access Code: [email or web address] URL: [email or web address] Date: 12/11/2022 Prepared by: Jari Favre  Exercises - Supine Hip Internal and External Rotation  - 1 x daily - 7 x weekly - 1 sets - 10 reps - 5 sec hold - Supine Upper Trunk and Cervical Rotation with Opposite Lower Trunk Rotation (Mirrored)  - 1 x daily - 7 x weekly - 1 sets - 10 reps - 5 hold - Sidelying Mid Thoracic Rotation (Mirrored)  - 1 x daily - 7 x weekly - 3 sets - 10 reps - Supine Transversus Abdominis Bracing - Hands on Ground  - 1 x daily - 7 x weekly - 3 sets - 10 reps  Patient Education - Trigger Point Dry Needling  ASSESSMENT:  CLINICAL IMPRESSION: Patient did well with transversus abdominus activation with breathing needing moderate amount of cues and some lumbar support.  Pt got good twitch response and soft tissue length in low back with  dry needling and manual techniques. Pt will benefit from skilled PT to work on improved strength and coordination for improving bowel control.  OBJECTIVE IMPAIRMENTS: decreased activity tolerance, decreased coordination, decreased endurance, decreased ROM,  decreased strength, impaired flexibility, impaired sensation, impaired tone, postural dysfunction, and pain.   ACTIVITY LIMITATIONS: continence and toileting  PARTICIPATION LIMITATIONS: community activity  PERSONAL FACTORS: 3+ comorbidities: history of HTN, arthritis, asthma, chronic constipation, hypothyroidism (most recent TFT's wnl), sleep apnea, essential tremor, fibromyalgia, overactive bladder, hysterectomy, and cholecystectomy  are also affecting patient's functional outcome.   REHAB POTENTIAL: Excellent  CLINICAL DECISION MAKING: Evolving/moderate complexity  EVALUATION COMPLEXITY: Moderate   GOALS: Goals reviewed with patient? Yes  SHORT TERM GOALS: Target date: 11/28/22  Pt  will be ind with toileting techniques Baseline: Goal status: IN PROGRESS  2.  Pt will be ind with initial HEP Baseline:  Goal status: IN PROGRESS  3.  Pt will be able to isolate external anal sphincter Baseline:  Goal status: IN PROGRESS   LONG TERM GOALS: Target date: 01/23/23  Pt will be independent with advanced HEP to maintain improvements made throughout therapy  Baseline:  Goal status: INITIAL  2.  Pt will report 75% reduction of pain due to improvements in posture, strength, and muscle length  Baseline: 3/10 before bowel movements Goal status: INITIAL  3.  Pt will have 75% reduced leakage during a typical week.  Baseline:  Goal status: INITIAL  4.  Pt will have more solid stool for improved emptying Baseline:  Goal status: INITIAL    PLAN:  PT FREQUENCY: 1x/week  PT DURATION: 12 weeks  PLANNED INTERVENTIONS: Therapeutic exercises, Therapeutic activity, Neuromuscular re-education, Balance training, Gait training, Patient/Family education, Self Care, Joint mobilization, Dry Needling, Electrical stimulation, Cryotherapy, Moist heat, scar mobilization, Taping, Traction, Ultrasound, Biofeedback, Manual therapy, and Re-evaluation  PLAN FOR NEXT SESSION: biofeedback for EAS next and f/u on dry needling #1   Camillo Flaming Jaxen Samples, PT 12/11/2022, 3:16 PM

## 2022-12-14 ENCOUNTER — Ambulatory Visit
Admission: RE | Admit: 2022-12-14 | Discharge: 2022-12-14 | Disposition: A | Discharge status: Home / Self Care | Payer: Medicare Other | Source: Ambulatory Visit | Attending: Internal Medicine | Admitting: Internal Medicine

## 2022-12-14 DIAGNOSIS — Z1231 Encounter for screening mammogram for malignant neoplasm of breast: Secondary | ICD-10-CM

## 2022-12-18 ENCOUNTER — Ambulatory Visit: Payer: Medicare Other | Attending: Physician Assistant | Admitting: Physical Therapy

## 2022-12-18 DIAGNOSIS — R279 Unspecified lack of coordination: Secondary | ICD-10-CM | POA: Diagnosis present

## 2022-12-18 DIAGNOSIS — M6281 Muscle weakness (generalized): Secondary | ICD-10-CM | POA: Insufficient documentation

## 2022-12-18 DIAGNOSIS — R293 Abnormal posture: Secondary | ICD-10-CM

## 2022-12-18 NOTE — Therapy (Signed)
OUTPATIENT PHYSICAL THERAPY FEMALE PELVIC TREATMENT   Patient Name: Mariah Snyder MRN: [id number] DOB:1945/12/14, 77 y.o., female Today's Date: 12/18/2022  END OF SESSION:  PT End of Session - 12/18/22 1050     Visit Number 3    Date for PT Re-Evaluation 01/23/23    Authorization Type medicare    PT Start Time 1052    PT Stop Time 1136    PT Time Calculation (min) 44 min    Activity Tolerance Patient tolerated treatment well    Behavior During Therapy WFL for tasks assessed/performed               Past Medical History:  Diagnosis Date   Anemia    Anxiety    Arthritis    Asthma    Fibromyalgia    Gallstones    GERD (gastroesophageal reflux disease)    Hyperlipidemia    Hypertension    Hypothyroidism    Kidney stones    Peptic ulcer    Rectal prolapse    Sleep apnea    Urinary tract infection    Past Surgical History:  Procedure Laterality Date   bunyionectomy Left    CHOLECYSTECTOMY     ELBOW ARTHROSCOPY Left    LAPAROSCOPIC ASSISTED VAGINAL HYSTERECTOMY     SHOULDER ARTHROSCOPY Right    TONSILLECTOMY     Patient Active Problem List   Diagnosis Date Noted   Rectal prolapse 09/14/2022   Constipation 09/14/2022   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/2021   Left hip pain 10/12/2021   High cholesterol 03/07/2021   Moderate persistent asthma without complication 0000000   Bereavement 02/06/2019   Bilateral temporomandibular joint pain 02/18/2018   Right leg numbness 05/21/2017   Squamous blepharitis of upper and lower eyelids of both eyes 05/21/2017   Meibomian gland dysfunction (MGD) of upper and lower lids of both eyes 02/20/2017   Chronic left-sided low back pain with left-sided sciatica 12/31/2016   OAB (overactive bladder) 08/29/2015   Hallux limitus 01/24/2015   Balance problems 0000000   Lichen sclerosus 0000000   Healthcare maintenance 01/12/2013   Essential tremor 07/08/2012   Gastro-esophageal reflux disease with esophagitis 07/02/2011    Fibromyalgia 05/27/2011   Keratoconjunctivitis sicca 05/10/2011   Senile nuclear sclerosis 05/10/2011   Tear film insufficiency 05/10/2011   Essential (primary) hypertension 02/26/2011   Degenerative spinal arthritis 08/28/2000   Hashimoto's thyroiditis 08/28/1980    PCP: Hoyt Koch, MD   REFERRING PROVIDER: Major, Enis Slipper, PA-C  REFERRING DIAG: R15.9 (ICD-10-CM) - Full incontinence of feces   THERAPY DIAG:  Muscle weakness (generalized)  Abnormal posture  Rationale for Evaluation and Treatment: Rehabilitation  ONSET DATE: since colonoscopy in April 2023  SUBJECTIVE:  SUBJECTIVE STATEMENT: I had food poisoning last weekend.  Pt states back   I had PT for this two years ago and it got better and had no leakage, then began again since colonoscopy.  Currently fecal incontinence of bowel movements, without awareness, which occurs about 3 times a week.  Has abdominal pain and urgency without passing a bowel movement.  On mirilax helps.  I got pneumonia from aspirating during anesthesia when I had the colonoscopy Fluid intake: Yes: not much water, I forget to drink    PAIN:  Are you having pain? Yes NPRS scale: 3/10 before bowel movements but none today Pain location:  abdomen  Pain type: cramping Pain description: intermittent   Aggravating factors: before bowel movements Relieving factors: takes a while for a few hours  PRECAUTIONS: None  WEIGHT BEARING RESTRICTIONS: No  FALLS:  Has patient fallen in last 6 months? Yes. 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, bible study, takes care of granddaughter  PLOF:  Independent  PATIENT GOALS: not have incontinence  PERTINENT HISTORY:  history of HTN, arthritis, asthma, chronic constipation, hypothyroidism (most recent TFT's wnl), sleep apnea, essential tremor, fibromyalgia, overactive bladder, hysterectomy, and cholecystectomy Sexual abuse: No  BOWEL MOVEMENT: Pain with bowel movement: Yes Type of bowel movement:Type (Bristol Stool Scale) soft or diarrhea, Frequency daily and then skip a couple days, and Strain Yes sometimes Fully empty rectum: No Leakage: Yes: 3/week Pads: Yes: sometimes Fiber supplement: No  URINATION: Pain with urination: No Fully empty bladder: No Stream: Strong Urgency: No Frequency: medication to control  Leakage:  none Pads: No  INTERCOURSE:   PREGNANCY: Vaginal deliveries 2 Tearing Yes: both   PROLAPSE: None   OBJECTIVE:   DIAGNOSTIC FINDINGS:    PATIENT SURVEYS:    PFIQ-7   COGNITION: Overall cognitive status: Within functional limits for tasks assessed     SENSATION: Light touch:  Proprioception:   MUSCLE LENGTH: Hamstrings: Right 80 deg; Left 80 deg   LUMBAR SPECIAL TESTS:    FUNCTIONAL TESTS:    GAIT:  Comments: slow due to tremors  POSTURE: rounded shoulders and increased thoracic kyphosis  PELVIC ALIGNMENT:  LUMBARAROM/PROM:  A/PROM A/PROM  eval  Flexion   Extension   Right lateral flexion   Left lateral flexion   Right rotation   Left rotation    (Blank rows = not tested)  LOWER EXTREMITY ROM:  Passive ROM Right eval Left eval  Hip flexion 75% 90%  Hip extension    Hip abduction    Hip adduction    Hip internal rotation    Hip external rotation 75% 90%  Knee flexion    Knee extension    Ankle dorsiflexion    Ankle plantarflexion    Ankle inversion    Ankle eversion     (Blank rows = not tested)  LOWER EXTREMITY MMT:  MMT Right eval Left eval  Hip flexion    Hip extension    Hip abduction    Hip adduction    Hip internal rotation     Hip external rotation    Knee flexion    Knee extension    Ankle dorsiflexion    Ankle plantarflexion    Ankle inversion    Ankle eversion     PALPATION:   General  External Perineal Exam no anal wink present, hemorrhoids                             Internal Pelvic Floor co-contracting with gluteals and diminished external anal sphincter  Patient confirms identification and approves PT to assess internal pelvic floor and treatment Yes  PELVIC MMT:   MMT eval  Vaginal   Internal Anal Sphincter   External Anal Sphincter 1/5  Puborectalis 3/5  Diastasis Recti   (Blank rows = not tested)        TONE: low  PROLAPSE: no  TODAY'S TREATMENT:                                                                                                                              DATE: 12/18/22  NMRE: biofeedback at EAS to work on isolation and control Coordination of breathing and core  Sidelying pelvic floor Supine block Supine march Supine clam yellow loop Supine 2lb weights small arch Press on thighs core activation and ball squeeze sitting Seated march  PATIENT EDUCATION:  Education details: POC and biofeedback information Person educated: Patient Education method: Explanation Education comprehension: verbalized understanding  HOME EXERCISE PROGRAM: Access Code: [email or web address] URL: [email or web address] Date: 12/11/2022 Prepared by: Jari Favre  Exercises - Supine Hip Internal and External Rotation  - 1 x daily - 7 x weekly - 1 sets - 10 reps - 5 sec hold - Supine Upper Trunk and Cervical Rotation with Opposite Lower Trunk Rotation (Mirrored)  - 1 x daily - 7 x weekly - 1 sets - 10 reps - 5 hold - Sidelying Mid Thoracic Rotation (Mirrored)  - 1 x daily - 7 x weekly - 3 sets - 10 reps - Supine Transversus Abdominis Bracing - Hands on Ground  - 1 x daily - 7 x weekly - 3 sets - 10 reps  Patient Education - Trigger Point Dry  Needling  ASSESSMENT:  CLINICAL IMPRESSION: Patient did well with biofeedback.  Pt was able to progress exercises for isolating pelvic floor.  Noted that she has very low resting tone <64mV and unable to sustain contraction for more than 2 seconds at a time.  Pt could hold but was shaky.  Pt was given exercises to add to HEP to work on further strength and endurance of these muscles.  Pt will benefit from skilled PT to work on improved strength and coordination for improving bowel control.  OBJECTIVE IMPAIRMENTS: decreased activity tolerance, decreased coordination, decreased endurance, decreased ROM, decreased strength, impaired flexibility, impaired sensation, impaired tone, postural dysfunction, and pain.   ACTIVITY LIMITATIONS: continence and toileting  PARTICIPATION LIMITATIONS: community activity  PERSONAL FACTORS: 3+ comorbidities: history of HTN, arthritis, asthma, chronic constipation, hypothyroidism (most recent TFT's wnl), sleep apnea, essential tremor, fibromyalgia, overactive bladder, hysterectomy, and cholecystectomy  are also affecting patient's functional outcome.   REHAB POTENTIAL: Excellent  CLINICAL DECISION MAKING: Evolving/moderate complexity  EVALUATION  COMPLEXITY: Moderate   GOALS: Goals reviewed with patient? Yes  SHORT TERM GOALS: Target date: 11/28/22  Pt will be ind with toileting techniques Baseline: Goal status: MET  2.  Pt will be ind with initial HEP Baseline:  Goal status: MET  3.  Pt will be able to isolate external anal sphincter Baseline: working with biofeedback today Goal status: IN PROGRESS   LONG TERM GOALS: Target date: 01/23/23  Pt will be independent with advanced HEP to maintain improvements made throughout therapy  Baseline:  Goal status: INITIAL  2.  Pt will report 75% reduction of pain due to improvements in posture, strength, and muscle length  Baseline: 3/10 before bowel movements Goal status: INITIAL  3.  Pt will have 75%  reduced leakage during a typical week.  Baseline:  Goal status: INITIAL  4.  Pt will have more solid stool for improved emptying Baseline:  Goal status: INITIAL    PLAN:  PT FREQUENCY: 1x/week  PT DURATION: 12 weeks  PLANNED INTERVENTIONS: Therapeutic exercises, Therapeutic activity, Neuromuscular re-education, Balance training, Gait training, Patient/Family education, Self Care, Joint mobilization, Dry Needling, Electrical stimulation, Cryotherapy, Moist heat, scar mobilization, Taping, Traction, Ultrasound, Biofeedback, Manual therapy, and Re-evaluation  PLAN FOR NEXT SESSION: biofeedback for EAS if pt wants to use this tool, progress strength for posture and kegel, do some kegels with longer hold   [employer], PT 12/18/2022, 10:54 AM

## 2022-12-19 ENCOUNTER — Encounter: Payer: Self-pay | Admitting: Internal Medicine

## 2022-12-19 LAB — HM MAMMOGRAPHY

## 2022-12-21 ENCOUNTER — Ambulatory Visit (INDEPENDENT_AMBULATORY_CARE_PROVIDER_SITE_OTHER): Payer: Medicare Other | Admitting: Podiatry

## 2022-12-21 DIAGNOSIS — M79675 Pain in left toe(s): Secondary | ICD-10-CM

## 2022-12-21 DIAGNOSIS — M79674 Pain in right toe(s): Secondary | ICD-10-CM

## 2022-12-21 DIAGNOSIS — B351 Tinea unguium: Secondary | ICD-10-CM

## 2022-12-25 ENCOUNTER — Ambulatory Visit: Payer: Medicare Other | Admitting: Physical Therapy

## 2022-12-25 ENCOUNTER — Encounter: Payer: Self-pay | Admitting: Physical Therapy

## 2022-12-25 DIAGNOSIS — M6281 Muscle weakness (generalized): Secondary | ICD-10-CM | POA: Diagnosis not present

## 2022-12-25 DIAGNOSIS — R293 Abnormal posture: Secondary | ICD-10-CM

## 2022-12-25 DIAGNOSIS — R279 Unspecified lack of coordination: Secondary | ICD-10-CM

## 2022-12-25 NOTE — Therapy (Signed)
OUTPATIENT PHYSICAL THERAPY FEMALE PELVIC TREATMENT   Patient Name: Mariah Snyder MRN: [id number] DOB:07/01/1946, 77 y.o., female Today's Date: 12/25/2022  END OF SESSION:  PT End of Session - 12/25/22 1013     Visit Number 4    Date for PT Re-Evaluation 01/23/23    Authorization Type medicare    PT Start Time 1013    PT Stop Time 1052    PT Time Calculation (min) 39 min    Activity Tolerance Patient tolerated treatment well    Behavior During Therapy WFL for tasks assessed/performed                Past Medical History:  Diagnosis Date   Anemia    Anxiety    Arthritis    Asthma    Fibromyalgia    Gallstones    GERD (gastroesophageal reflux disease)    Hyperlipidemia    Hypertension    Hypothyroidism    Kidney stones    Peptic ulcer    Rectal prolapse    Sleep apnea    Urinary tract infection    Past Surgical History:  Procedure Laterality Date   bunyionectomy Left    CHOLECYSTECTOMY     ELBOW ARTHROSCOPY Left    LAPAROSCOPIC ASSISTED VAGINAL HYSTERECTOMY     SHOULDER ARTHROSCOPY Right    TONSILLECTOMY     Patient Active Problem List   Diagnosis Date Noted   Rectal prolapse 09/14/2022   Constipation 09/14/2022   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/2021   Left hip pain 10/12/2021   High cholesterol 03/07/2021   Moderate persistent asthma without complication 11/01/2020   Bereavement 02/06/2019   Bilateral temporomandibular joint pain 02/18/2018   Right leg numbness 05/21/2017   Squamous blepharitis of upper and lower eyelids of both eyes 05/21/2017   Meibomian gland dysfunction (MGD) of upper and lower lids of both eyes 02/20/2017   Chronic left-sided low back pain with left-sided sciatica 12/31/2016   OAB (overactive bladder) 08/29/2015   Hallux limitus 01/24/2015   Balance problems 05/12/2013   Lichen sclerosus 05/12/2013   Healthcare maintenance 01/12/2013   Essential tremor 07/08/2012   Gastro-esophageal reflux disease with esophagitis 07/02/2011    Fibromyalgia 05/27/2011   Keratoconjunctivitis sicca 05/10/2011   Senile nuclear sclerosis 05/10/2011   Tear film insufficiency 05/10/2011   Essential (primary) hypertension 02/26/2011   Degenerative spinal arthritis 08/28/2000   Hashimoto's thyroiditis 08/28/1980    PCP: Myrlene Brokerrawford, Elizabeth A, MD   REFERRING PROVIDER: Major, Holley DexterAnna Letton, PA-C  REFERRING DIAG: R15.9 (ICD-10-CM) - Full incontinence of feces   THERAPY DIAG:  Muscle weakness (generalized)  Abnormal posture  Unspecified lack of coordination  Rationale for Evaluation and Treatment: Rehabilitation  ONSET DATE: since colonoscopy in April 2023  SUBJECTIVE:  SUBJECTIVE STATEMENT: I had shooting pain after being here last week and couldn't sit due to pain.  The exercises were hurting  I had PT for this two years ago and it got better and had no leakage, then began again since colonoscopy.  Currently fecal incontinence of bowel movements, without awareness, which occurs about 3 times a week.  Has abdominal pain and urgency without passing a bowel movement.  On mirilax helps.  I got pneumonia from aspirating during anesthesia when I had the colonoscopy Fluid intake: Yes: not much water, I forget to drink    PAIN:  Are you having pain? Yes NPRS scale: 4-5/10  Pain location:  abdomen  Pain type: cramping Pain description: intermittent   Aggravating factors: sitting Relieving factors: lying on my back  PRECAUTIONS: None  WEIGHT BEARING RESTRICTIONS: No  FALLS:  Has patient fallen in last 6 months? Yes. 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, bible study, takes care of  granddaughter  PLOF: Independent  PATIENT GOALS: not have incontinence  PERTINENT HISTORY:  history of HTN, arthritis, asthma, chronic constipation, hypothyroidism (most recent TFT's wnl), sleep apnea, essential tremor, fibromyalgia, overactive bladder, hysterectomy, and cholecystectomy Sexual abuse: No  BOWEL MOVEMENT: Pain with bowel movement: Yes Type of bowel movement:Type (Bristol Stool Scale) soft or diarrhea, Frequency daily and then skip a couple days, and Strain Yes sometimes Fully empty rectum: No Leakage: Yes: 3/week Pads: Yes: sometimes Fiber supplement: No  URINATION: Pain with urination: No Fully empty bladder: No Stream: Strong Urgency: No Frequency: medication to control  Leakage:  none Pads: No  INTERCOURSE:   PREGNANCY: Vaginal deliveries 2 Tearing Yes: both   PROLAPSE: None   OBJECTIVE:   DIAGNOSTIC FINDINGS:    PATIENT SURVEYS:    PFIQ-7   COGNITION: Overall cognitive status: Within functional limits for tasks assessed     SENSATION: Light touch:  Proprioception:   MUSCLE LENGTH: Hamstrings: Right 80 deg; Left 80 deg   LUMBAR SPECIAL TESTS:    FUNCTIONAL TESTS:    GAIT:  Comments: slow due to tremors  POSTURE: rounded shoulders and increased thoracic kyphosis  PELVIC ALIGNMENT:  LUMBARAROM/PROM:  A/PROM A/PROM  eval  Flexion   Extension   Right lateral flexion   Left lateral flexion   Right rotation   Left rotation    (Blank rows = not tested)  LOWER EXTREMITY ROM:  Passive ROM Right eval Left eval  Hip flexion 75% 90%  Hip extension    Hip abduction    Hip adduction    Hip internal rotation    Hip external rotation 75% 90%  Knee flexion    Knee extension    Ankle dorsiflexion    Ankle plantarflexion    Ankle inversion    Ankle eversion     (Blank rows = not tested)  LOWER EXTREMITY MMT:  MMT Right eval Left eval  Hip flexion    Hip extension    Hip abduction    Hip adduction    Hip  internal rotation    Hip external rotation    Knee flexion    Knee extension    Ankle dorsiflexion    Ankle plantarflexion    Ankle inversion    Ankle eversion     PALPATION:   General  External Perineal Exam no anal wink present, hemorrhoids                             Internal Pelvic Floor co-contracting with gluteals and diminished external anal sphincter  Patient confirms identification and approves PT to assess internal pelvic floor and treatment Yes  PELVIC MMT:   MMT eval  Vaginal   Internal Anal Sphincter   External Anal Sphincter 1/5  Puborectalis 3/5  Diastasis Recti   (Blank rows = not tested)        TONE: low  PROLAPSE: no  TODAY'S TREATMENT:                                                                                                                              DATE: 12/25/22  Manual: Lumbar and gluteal massage, addaday and trigger point release with pressure and dry needling  Trigger Point Dry-Needling  Treatment instructions: Expect mild to moderate muscle soreness. S/S of pneumothorax if dry needled over a lung field, and to seek immediate medical attention should they occur. Patient verbalized understanding of these instructions and education.  Patient Consent Given: Yes Education handout provided: Previously provided Muscles treated: lumbar multifidi and Rt glute med Electrical stimulation performed: No Parameters: N/A Treatment response/outcome: twitches and increased soft tissue length  Exercises: Child pose breathing stationary and with rocking  PATIENT EDUCATION:  Education details: Access Code: [email or web address] and added qped rocking and circles Person educated: Patient Education method: Explanation Education comprehension: verbalized understanding  HOME EXERCISE PROGRAM: Access Code: [email or web address] URL: [email or web address] Date: 12/11/2022 Prepared by: Dwana Curd  Exercises - Supine Hip Internal and  External Rotation  - 1 x daily - 7 x weekly - 1 sets - 10 reps - 5 sec hold - Supine Upper Trunk and Cervical Rotation with Opposite Lower Trunk Rotation (Mirrored)  - 1 x daily - 7 x weekly - 1 sets - 10 reps - 5 hold - Sidelying Mid Thoracic Rotation (Mirrored)  - 1 x daily - 7 x weekly - 3 sets - 10 reps - Supine Transversus Abdominis Bracing - Hands on Ground  - 1 x daily - 7 x weekly - 3 sets - 10 reps  Patient Education - Trigger Point Dry Needling  ASSESSMENT:  CLINICAL IMPRESSION: Patient arrived with increased pain today. Pt had good response from dry needling and manaul techniques and had less pain after treatent.  Pt was given exercises with deep breathing to work on improved lumbar muscle length.  Not able to porgress strength today as she was overusing the wrong muscles with exercises initially.  Pt will benefit from skilled PT to work on improved muscle coordination for pain management and improved bowel control.  OBJECTIVE IMPAIRMENTS: decreased activity tolerance, decreased coordination, decreased endurance, decreased ROM, decreased strength, impaired flexibility, impaired sensation, impaired tone, postural dysfunction, and pain.   ACTIVITY LIMITATIONS: continence and toileting  PARTICIPATION LIMITATIONS:  community activity  PERSONAL FACTORS: 3+ comorbidities: history of HTN, arthritis, asthma, chronic constipation, hypothyroidism (most recent TFT's wnl), sleep apnea, essential tremor, fibromyalgia, overactive bladder, hysterectomy, and cholecystectomy  are also affecting patient's functional outcome.   REHAB POTENTIAL: Excellent  CLINICAL DECISION MAKING: Evolving/moderate complexity  EVALUATION COMPLEXITY: Moderate   GOALS: Goals reviewed with patient? Yes  SHORT TERM GOALS: Target date: 11/28/22  Pt will be ind with toileting techniques Baseline: Goal status: MET  2.  Pt will be ind with initial HEP Baseline:  Goal status: MET  3.  Pt will be able to  isolate external anal sphincter Baseline: working with biofeedback today Goal status: IN PROGRESS   LONG TERM GOALS: Target date: 01/23/23  Pt will be independent with advanced HEP to maintain improvements made throughout therapy  Baseline:  Goal status: INITIAL  2.  Pt will report 75% reduction of pain due to improvements in posture, strength, and muscle length  Baseline: 3/10 before bowel movements Goal status: INITIAL  3.  Pt will have 75% reduced leakage during a typical week.  Baseline:  Goal status: INITIAL  4.  Pt will have more solid stool for improved emptying Baseline:  Goal status: INITIAL    PLAN:  PT FREQUENCY: 1x/week  PT DURATION: 12 weeks  PLANNED INTERVENTIONS: Therapeutic exercises, Therapeutic activity, Neuromuscular re-education, Balance training, Gait training, Patient/Family education, Self Care, Joint mobilization, Dry Needling, Electrical stimulation, Cryotherapy, Moist heat, scar mobilization, Taping, Traction, Ultrasound, Biofeedback, Manual therapy, and Re-evaluation  PLAN FOR NEXT SESSION: biofeedback for EAS - RUSI, dry needling #3 lumbar and possibly gluteals if trigger points; progress strength for posture and kegel, do some kegels with longer hold   [employer], PT 12/25/2022, 10:42 AM

## 2022-12-27 NOTE — Progress Notes (Signed)
Subjective: Chief Complaint  Patient presents with   Nail Problem    Thick painful toenails, 3 month follow up     77 year old female with the above concerns.  She is nails are thickened elongated she not able to trim them herself. No open lesions. No other concerns.   Objective: AAO x3, NAD DP/PT pulses palpable bilaterally, CRT less than 3 seconds Nails are hypertrophic, dystrophic, brittle, discolored, elongated 10.  Incurvation of the nails without any signs of infection.  Most notably the right hallux toenail is the most thickened.  Tenderness nails 1-5 bilaterally.  No open lesions or pre-ulcerative lesions are identified today.   No pain with calf compression, swelling, warmth, erythema  Assessment: Ingrown toenail, symptomatic onychomycosis  Plan: -All treatment options discussed with the patient including all alternatives, risks, complications.  -Sharply debrided nails x10 without any complications or bleeding.  Monitor ingrown toenails.  If they become more symptomatic will need to proceed with partial nail avulsions. -Patient encouraged to call the office with any questions, concerns, change in symptoms.   Vivi Barrack DPM

## 2022-12-31 ENCOUNTER — Encounter: Payer: Medicare Other | Admitting: Physical Therapy

## 2023-01-09 ENCOUNTER — Ambulatory Visit: Payer: Medicare Other | Admitting: Physical Therapy

## 2023-01-09 DIAGNOSIS — M6281 Muscle weakness (generalized): Secondary | ICD-10-CM

## 2023-01-09 DIAGNOSIS — R293 Abnormal posture: Secondary | ICD-10-CM

## 2023-01-09 DIAGNOSIS — R279 Unspecified lack of coordination: Secondary | ICD-10-CM

## 2023-01-09 NOTE — Therapy (Signed)
OUTPATIENT PHYSICAL THERAPY FEMALE PELVIC TREATMENT   Patient Name: Misa Fedorko MRN: [id number] DOB:[birth year], 77 y.o., female Today's Date: 01/09/2023  END OF SESSION:  PT End of Session - 01/09/23 1149     Visit Number 5    Date for PT Re-Evaluation 01/23/23    Authorization Type medicare    PT Start Time 1104    PT Stop Time 1146    PT Time Calculation (min) 42 min    Activity Tolerance Patient tolerated treatment well    Behavior During Therapy WFL for tasks assessed/performed                 Past Medical History:  Diagnosis Date   Anemia    Anxiety    Arthritis    Asthma    Fibromyalgia    Gallstones    GERD (gastroesophageal reflux disease)    Hyperlipidemia    Hypertension    Hypothyroidism    Kidney stones    Peptic ulcer    Rectal prolapse    Sleep apnea    Urinary tract infection    Past Surgical History:  Procedure Laterality Date   bunyionectomy Left    CHOLECYSTECTOMY     ELBOW ARTHROSCOPY Left    LAPAROSCOPIC ASSISTED VAGINAL HYSTERECTOMY     SHOULDER ARTHROSCOPY Right    TONSILLECTOMY     Patient Active Problem List   Diagnosis Date Noted   Rectal prolapse 09/14/2022   Constipation 09/14/2022   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/2021   Left hip pain 10/12/2021   High cholesterol 03/07/2021   Moderate persistent asthma without complication 11/01/2020   Bereavement 02/06/2019   Bilateral temporomandibular joint pain 02/18/2018   Right leg numbness 05/21/2017   Squamous blepharitis of upper and lower eyelids of both eyes 05/21/2017   Meibomian gland dysfunction (MGD) of upper and lower lids of both eyes 02/20/2017   Chronic left-sided low back pain with left-sided sciatica 12/31/2016   OAB (overactive bladder) 08/29/2015   Hallux limitus 01/24/2015   Balance problems 05/12/2013   Lichen sclerosus 05/12/2013   Healthcare maintenance 01/12/2013   Essential tremor 07/08/2012   Gastro-esophageal reflux disease with esophagitis  07/02/2011   Fibromyalgia 05/27/2011   Keratoconjunctivitis sicca 05/10/2011   Senile nuclear sclerosis 05/10/2011   Tear film insufficiency 05/10/2011   Essential (primary) hypertension 02/26/2011   Degenerative spinal arthritis 08/28/2000   Hashimoto's thyroiditis 08/28/1980    PCP: Myrlene Broker, MD   REFERRING PROVIDER: Major, Holley Dexter, PA-C  REFERRING DIAG: R15.9 (ICD-10-CM) - Full incontinence of feces   THERAPY DIAG:  Muscle weakness (generalized)  Abnormal posture  Unspecified lack of coordination  Rationale for Evaluation and Treatment: Rehabilitation  ONSET DATE: since colonoscopy in April 2023  SUBJECTIVE:  SUBJECTIVE STATEMENT: I had pain for 2 days after dry needling but then it went away.  Leakage is much better as well.  Left low back is painful when I sleep on it still.    I had PT for this two years ago and it got better and had no leakage, then began again since colonoscopy.  Currently fecal incontinence of bowel movements, without awareness, which occurs about 3 times a week.  Has abdominal pain and urgency without passing a bowel movement.  On mirilax helps.  I got pneumonia from aspirating during anesthesia when I had the colonoscopy Fluid intake: Yes: not much water, I forget to drink    PAIN:  Are you having pain? No   PRECAUTIONS: None  WEIGHT BEARING RESTRICTIONS: No  FALLS:  Has patient fallen in last 6 months? Yes. 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, bible study, takes care of granddaughter  PLOF: Independent  PATIENT GOALS: not have incontinence  PERTINENT HISTORY:  history of HTN,  arthritis, asthma, chronic constipation, hypothyroidism (most recent TFT's wnl), sleep apnea, essential tremor, fibromyalgia, overactive bladder, hysterectomy, and cholecystectomy Sexual abuse: No  BOWEL MOVEMENT: Pain with bowel movement: Yes Type of bowel movement:Type (Bristol Stool Scale) soft or diarrhea, Frequency daily and then skip a couple days, and Strain Yes sometimes Fully empty rectum: No Leakage: Yes: 3/week Pads: Yes: sometimes Fiber supplement: No  URINATION: Pain with urination: No Fully empty bladder: No Stream: Strong Urgency: No Frequency: medication to control  Leakage:  none Pads: No  INTERCOURSE:   PREGNANCY: Vaginal deliveries 2 Tearing Yes: both   PROLAPSE: None   OBJECTIVE:   DIAGNOSTIC FINDINGS:    PATIENT SURVEYS:    PFIQ-7   COGNITION: Overall cognitive status: Within functional limits for tasks assessed     SENSATION: Light touch:  Proprioception:   MUSCLE LENGTH: Hamstrings: Right 80 deg; Left 80 deg   LUMBAR SPECIAL TESTS:    FUNCTIONAL TESTS:    GAIT:  Comments: slow due to tremors  POSTURE: rounded shoulders and increased thoracic kyphosis  PELVIC ALIGNMENT:  LUMBARAROM/PROM:  A/PROM A/PROM  eval  Flexion   Extension   Right lateral flexion   Left lateral flexion   Right rotation   Left rotation    (Blank rows = not tested)  LOWER EXTREMITY ROM:  Passive ROM Right eval Left eval  Hip flexion 75% 90%  Hip extension    Hip abduction    Hip adduction    Hip internal rotation    Hip external rotation 75% 90%  Knee flexion    Knee extension    Ankle dorsiflexion    Ankle plantarflexion    Ankle inversion    Ankle eversion     (Blank rows = not tested)  LOWER EXTREMITY MMT:  MMT Right eval Left eval  Hip flexion    Hip extension    Hip abduction    Hip adduction    Hip internal rotation    Hip external rotation    Knee flexion    Knee extension    Ankle dorsiflexion    Ankle  plantarflexion    Ankle inversion    Ankle eversion     PALPATION:   General                  External Perineal Exam no anal wink present, hemorrhoids  Internal Pelvic Floor co-contracting with gluteals and diminished external anal sphincter  Patient confirms identification and approves PT to assess internal pelvic floor and treatment Yes  PELVIC MMT:   MMT eval  Vaginal   Internal Anal Sphincter   External Anal Sphincter 1/5  Puborectalis 3/5  Diastasis Recti   (Blank rows = not tested)        TONE: low  PROLAPSE: no  TODAY'S TREATMENT:                                                                                                                              DATE: 01/09/23  Manual: Lumbar and gluteal STM,  trigger point release with manual pressure and dry needling  Trigger Point Dry-Needling  Treatment instructions: Expect mild to moderate muscle soreness. S/S of pneumothorax if dry needled over a lung field, and to seek immediate medical attention should they occur. Patient verbalized understanding of these instructions and education.  Patient Consent Given: Yes Education handout provided: Previously provided Muscles treated: bil Rt glute med Electrical stimulation performed: No Parameters: N/A Treatment response/outcome: twitches and increased soft tissue length  Exercises: Sidelying UE pressure into block with exhale 10x Sidelying same as above and add clam - 10x bil  PATIENT EDUCATION:  Education details: Access Code: [email or web address] and added qped rocking and circles Person educated: Patient Education method: Explanation Education comprehension: verbalized understanding  HOME EXERCISE PROGRAM: Access Code: [email or web address] URL: [email or web address] Date: 12/11/2022 Prepared by: Dwana Curd  Exercises - Supine Hip Internal and External Rotation  - 1 x daily - 7 x weekly - 1 sets - 10 reps - 5 sec hold - Supine  Upper Trunk and Cervical Rotation with Opposite Lower Trunk Rotation (Mirrored)  - 1 x daily - 7 x weekly - 1 sets - 10 reps - 5 hold - Sidelying Mid Thoracic Rotation (Mirrored)  - 1 x daily - 7 x weekly - 3 sets - 10 reps - Supine Transversus Abdominis Bracing - Hands on Ground  - 1 x daily - 7 x weekly - 3 sets - 10 reps  Patient Education - Trigger Point Dry Needling  ASSESSMENT:  CLINICAL IMPRESSION: Patient having much less pain today and reports no leakage, but does mention some fecal smearing due to incomplete bowel movements.  Tension in gluteals to address some pain in left SI joint was done today with improved soft tissue length.  Pt given stretch to perform for continued maintenance of improvements achieved in today's session.   Pt will benefit from skilled PT to work on improved muscle coordination for pain management and improved bowel control.  OBJECTIVE IMPAIRMENTS: decreased activity tolerance, decreased coordination, decreased endurance, decreased ROM, decreased strength, impaired flexibility, impaired sensation, impaired tone, postural dysfunction, and pain.   ACTIVITY LIMITATIONS: continence and toileting  PARTICIPATION LIMITATIONS: community activity  PERSONAL FACTORS: 3+ comorbidities: history of HTN, arthritis, asthma, chronic constipation, hypothyroidism (most recent TFT's wnl), sleep apnea, essential tremor, fibromyalgia,  overactive bladder, hysterectomy, and cholecystectomy  are also affecting patient's functional outcome.   REHAB POTENTIAL: Excellent  CLINICAL DECISION MAKING: Evolving/moderate complexity  EVALUATION COMPLEXITY: Moderate   GOALS: Goals reviewed with patient? Yes  SHORT TERM GOALS: Target date: 11/28/22  Pt will be ind with toileting techniques Baseline: Goal status: MET  2.  Pt will be ind with initial HEP Baseline:  Goal status: MET  3.  Pt will be able to isolate external anal sphincter Baseline: working with biofeedback  today Goal status: IN PROGRESS   LONG TERM GOALS: Target date: 01/23/23  Pt will be independent with advanced HEP to maintain improvements made throughout therapy  Baseline:  Goal status: INITIAL  2.  Pt will report 75% reduction of pain due to improvements in posture, strength, and muscle length  Baseline: 3/10 before bowel movements Goal status: INITIAL  3.  Pt will have 75% reduced leakage during a typical week.  Baseline:  Goal status: INITIAL  4.  Pt will have more solid stool for improved emptying Baseline:  Goal status: INITIAL    PLAN:  PT FREQUENCY: 1x/week  PT DURATION: 12 weeks  PLANNED INTERVENTIONS: Therapeutic exercises, Therapeutic activity, Neuromuscular re-education, Balance training, Gait training, Patient/Family education, Self Care, Joint mobilization, Dry Needling, Electrical stimulation, Cryotherapy, Moist heat, scar mobilization, Taping, Traction, Ultrasound, Biofeedback, Manual therapy, and Re-evaluation  PLAN FOR NEXT SESSION: biofeedback for EAS? , dry needling #4 lumbar and gluteals if trigger points; progress strength for posture and kegel/core with exhale   Brayton Caves Cristel Rail, PT 01/09/2023, 11:50 AM

## 2023-01-15 ENCOUNTER — Ambulatory Visit: Payer: Medicare Other | Admitting: Physical Therapy

## 2023-01-15 DIAGNOSIS — R293 Abnormal posture: Secondary | ICD-10-CM

## 2023-01-15 DIAGNOSIS — R279 Unspecified lack of coordination: Secondary | ICD-10-CM

## 2023-01-15 DIAGNOSIS — M6281 Muscle weakness (generalized): Secondary | ICD-10-CM

## 2023-01-15 NOTE — Therapy (Signed)
OUTPATIENT PHYSICAL THERAPY FEMALE PELVIC TREATMENT   Patient Name: Shyniece Scripter MRN: [id number] DOB:12-20-1945, 77 y.o., female Today's Date: 01/15/2023  END OF SESSION:  PT End of Session - 01/15/23 1140     Visit Number 7    Date for PT Re-Evaluation 01/23/23    Authorization Type medicare    PT Start Time 1100    PT Stop Time 1138    PT Time Calculation (min) 38 min                  Past Medical History:  Diagnosis Date   Anemia    Anxiety    Arthritis    Asthma    Fibromyalgia    Gallstones    GERD (gastroesophageal reflux disease)    Hyperlipidemia    Hypertension    Hypothyroidism    Kidney stones    Peptic ulcer    Rectal prolapse    Sleep apnea    Urinary tract infection    Past Surgical History:  Procedure Laterality Date   bunyionectomy Left    CHOLECYSTECTOMY     ELBOW ARTHROSCOPY Left    LAPAROSCOPIC ASSISTED VAGINAL HYSTERECTOMY     SHOULDER ARTHROSCOPY Right    TONSILLECTOMY     Patient Active Problem List   Diagnosis Date Noted   Rectal prolapse 09/14/2022   Constipation 09/14/2022   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/2021   Left hip pain 10/12/2021   High cholesterol 03/07/2021   Moderate persistent asthma without complication 11/01/2020   Bereavement 02/06/2019   Bilateral temporomandibular joint pain 02/18/2018   Right leg numbness 05/21/2017   Squamous blepharitis of upper and lower eyelids of both eyes 05/21/2017   Meibomian gland dysfunction (MGD) of upper and lower lids of both eyes 02/20/2017   Chronic left-sided low back pain with left-sided sciatica 12/31/2016   OAB (overactive bladder) 08/29/2015   Hallux limitus 01/24/2015   Balance problems 05/12/2013   Lichen sclerosus 05/12/2013   Healthcare maintenance 01/12/2013   Essential tremor 07/08/2012   Gastro-esophageal reflux disease with esophagitis 07/02/2011   Fibromyalgia 05/27/2011   Keratoconjunctivitis sicca (HCC) 05/10/2011   Senile nuclear sclerosis  05/10/2011   Tear film insufficiency 05/10/2011   Essential (primary) hypertension 02/26/2011   Degenerative spinal arthritis 08/28/2000   Hashimoto's thyroiditis 08/28/1980    PCP: Myrlene Broker, MD   REFERRING PROVIDER: Major, Holley Dexter, PA-C  REFERRING DIAG: R15.9 (ICD-10-CM) - Full incontinence of feces   THERAPY DIAG:  Muscle weakness (generalized)  Abnormal posture  Unspecified lack of coordination  Rationale for Evaluation and Treatment: Rehabilitation  ONSET DATE: since colonoscopy in April 2023  SUBJECTIVE:  SUBJECTIVE STATEMENT: I have pain on the left side all the time but the right side doesn't hurt at all.  I had diarrhea from eating squat but no leakage.    I had PT for this two years ago and it got better and had no leakage, then began again since colonoscopy.  Currently fecal incontinence of bowel movements, without awareness, which occurs about 3 times a week.  Has abdominal pain and urgency without passing a bowel movement.  On mirilax helps.  I got pneumonia from aspirating during anesthesia when I had the colonoscopy Fluid intake: Yes: not much water, I forget to drink    PAIN:  Are you having pain? No   PRECAUTIONS: None  WEIGHT BEARING RESTRICTIONS: No  FALLS:  Has patient fallen in last 6 months? Yes. 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, bible study, takes care of granddaughter  PLOF: Independent  PATIENT GOALS: not have incontinence  PERTINENT HISTORY:  history of HTN, arthritis, asthma, chronic constipation, hypothyroidism (most recent TFT's wnl), sleep apnea, essential tremor, fibromyalgia,  overactive bladder, hysterectomy, and cholecystectomy Sexual abuse: No  BOWEL MOVEMENT: Pain with bowel movement: Yes Type of bowel movement:Type (Bristol Stool Scale) soft or diarrhea, Frequency daily and then skip a couple days, and Strain Yes sometimes Fully empty rectum: No Leakage: Yes: 3/week Pads: Yes: sometimes Fiber supplement: No  URINATION: Pain with urination: No Fully empty bladder: No Stream: Strong Urgency: No Frequency: medication to control  Leakage:  none Pads: No  INTERCOURSE:   PREGNANCY: Vaginal deliveries 2 Tearing Yes: both   PROLAPSE: None   OBJECTIVE:   DIAGNOSTIC FINDINGS:    PATIENT SURVEYS:    PFIQ-7   COGNITION: Overall cognitive status: Within functional limits for tasks assessed     SENSATION: Light touch:  Proprioception:   MUSCLE LENGTH: Hamstrings: Right 80 deg; Left 80 deg   LUMBAR SPECIAL TESTS:    FUNCTIONAL TESTS:    GAIT:  Comments: slow due to tremors  POSTURE: rounded shoulders and increased thoracic kyphosis  PELVIC ALIGNMENT:  LUMBARAROM/PROM:  A/PROM A/PROM  eval  Flexion   Extension   Right lateral flexion   Left lateral flexion   Right rotation   Left rotation    (Blank rows = not tested)  LOWER EXTREMITY ROM:  Passive ROM Right eval Left eval  Hip flexion 75% 90%  Hip extension    Hip abduction    Hip adduction    Hip internal rotation    Hip external rotation 75% 90%  Knee flexion    Knee extension    Ankle dorsiflexion    Ankle plantarflexion    Ankle inversion    Ankle eversion     (Blank rows = not tested)  LOWER EXTREMITY MMT:  MMT Right eval Left eval  Hip flexion    Hip extension    Hip abduction    Hip adduction    Hip internal rotation    Hip external rotation    Knee flexion    Knee extension    Ankle dorsiflexion    Ankle plantarflexion    Ankle inversion    Ankle eversion     PALPATION:   General                  External Perineal Exam no  anal wink present, hemorrhoids  Internal Pelvic Floor co-contracting with gluteals and diminished external anal sphincter  Patient confirms identification and approves PT to assess internal pelvic floor and treatment Yes  PELVIC MMT:   MMT eval  Vaginal   Internal Anal Sphincter   External Anal Sphincter 1/5  Puborectalis 3/5  Diastasis Recti   (Blank rows = not tested)        TONE: low  PROLAPSE: no  TODAY'S TREATMENT:                                                                                                                              DATE: 01/09/23  Manual: Lumbar and gluteal, left hip flexor, STM,  trigger point release with manual pressure and dry needling  Trigger Point Dry-Needling  Treatment instructions: Expect mild to moderate muscle soreness. S/S of pneumothorax if dry needled over a lung field, and to seek immediate medical attention should they occur. Patient verbalized understanding of these instructions and education.  Patient Consent Given: Yes Education handout provided: Previously provided Muscles treated: bil Rt glute med Electrical stimulation performed: No Parameters: N/A Treatment response/outcome: twitches and increased soft tissue length  Exercises: Seated kegel with ha-ha - ha for quick flickes and reviewed general HEP  PATIENT EDUCATION:  Education details: Access Code: [email or web address] and added qped rocking and circles Person educated: Patient Education method: Explanation Education comprehension: verbalized understanding  HOME EXERCISE PROGRAM: Access Code: [email or web address] URL: [email or web address] Date: 12/11/2022 Prepared by: Dwana Curd  Exercises - Supine Hip Internal and External Rotation  - 1 x daily - 7 x weekly - 1 sets - 10 reps - 5 sec hold - Supine Upper Trunk and Cervical Rotation with Opposite Lower Trunk Rotation (Mirrored)  - 1 x daily - 7 x weekly - 1 sets - 10 reps - 5 hold -  Sidelying Mid Thoracic Rotation (Mirrored)  - 1 x daily - 7 x weekly - 3 sets - 10 reps - Supine Transversus Abdominis Bracing - Hands on Ground  - 1 x daily - 7 x weekly - 3 sets - 10 reps  Patient Education - Trigger Point Dry Needling  ASSESSMENT:  CLINICAL IMPRESSION: Patient continues to have less pain today and no leakage or smearing.  Pt doing very well with manual techniuqes.  Pt is feeling pelvic floor muscles more easily and did well with quick flicks today.  Pt still has more tension on left side and manual techniques to address the pain that is still occurring on the left side.    Pt will benefit from skilled PT to work on improved muscle coordination for pain management and improved bowel control.  OBJECTIVE IMPAIRMENTS: decreased activity tolerance, decreased coordination, decreased endurance, decreased ROM, decreased strength, impaired flexibility, impaired sensation, impaired tone, postural dysfunction, and pain.   ACTIVITY LIMITATIONS: continence and toileting  PARTICIPATION LIMITATIONS: community activity  PERSONAL FACTORS: 3+ comorbidities: history of HTN, arthritis, asthma, chronic constipation, hypothyroidism (most recent TFT's wnl), sleep apnea,  essential tremor, fibromyalgia, overactive bladder, hysterectomy, and cholecystectomy  are also affecting patient's functional outcome.   REHAB POTENTIAL: Excellent  CLINICAL DECISION MAKING: Evolving/moderate complexity  EVALUATION COMPLEXITY: Moderate   GOALS: Goals reviewed with patient? Yes  SHORT TERM GOALS: Target date: 11/28/22  Pt will be ind with toileting techniques Baseline: Goal status: MET  2.  Pt will be ind with initial HEP Baseline:  Goal status: MET  3.  Pt will be able to isolate external anal sphincter Baseline: working with biofeedback today Goal status: IN PROGRESS   LONG TERM GOALS: Target date: 01/23/23  Pt will be independent with advanced HEP to maintain improvements made throughout  therapy  Baseline:  Goal status: IN PROGRESS  2.  Pt will report 75% reduction of pain due to improvements in posture, strength, and muscle length  Baseline:  Goal status: IN PROGRESS  3.  Pt will have 75% reduced leakage during a typical week.  Baseline: no leakage Goal status: MET  4.  Pt will have more solid stool for improved emptying Baseline:  Goal status: IN PROGRESS    PLAN:  PT FREQUENCY: 1x/week  PT DURATION: 12 weeks  PLANNED INTERVENTIONS: Therapeutic exercises, Therapeutic activity, Neuromuscular re-education, Balance training, Gait training, Patient/Family education, Self Care, Joint mobilization, Dry Needling, Electrical stimulation, Cryotherapy, Moist heat, scar mobilization, Taping, Traction, Ultrasound, Biofeedback, Manual therapy, and Re-evaluation  PLAN FOR NEXT SESSION: dry needling #5 lumbar and gluteals if trigger points; progress strength for posture and kegel/core with exhale   Brayton Caves Bentlie Withem, PT 01/15/2023, 11:46 AM

## 2023-01-22 ENCOUNTER — Encounter: Payer: Self-pay | Admitting: Physical Therapy

## 2023-01-22 ENCOUNTER — Ambulatory Visit: Payer: Medicare Other | Attending: Physician Assistant | Admitting: Physical Therapy

## 2023-01-22 DIAGNOSIS — R279 Unspecified lack of coordination: Secondary | ICD-10-CM | POA: Diagnosis present

## 2023-01-22 DIAGNOSIS — R293 Abnormal posture: Secondary | ICD-10-CM | POA: Insufficient documentation

## 2023-01-22 DIAGNOSIS — M6281 Muscle weakness (generalized): Secondary | ICD-10-CM | POA: Diagnosis present

## 2023-01-22 NOTE — Therapy (Signed)
OUTPATIENT PHYSICAL THERAPY FEMALE PELVIC TREATMENT   Patient Name: Mariah Snyder MRN: [id number] DOB:[birth year], 77 y.o., female Today's Date: 01/22/2023  END OF SESSION:  PT End of Session - 01/22/23 1026     Visit Number 8    Date for PT Re-Evaluation 05/25/23    Authorization Type medicare    PT Start Time 1012    PT Stop Time 1055    PT Time Calculation (min) 43 min    Activity Tolerance Patient tolerated treatment well    Behavior During Therapy WFL for tasks assessed/performed                   Past Medical History:  Diagnosis Date   Anemia    Anxiety    Arthritis    Asthma    Fibromyalgia    Gallstones    GERD (gastroesophageal reflux disease)    Hyperlipidemia    Hypertension    Hypothyroidism    Kidney stones    Peptic ulcer    Rectal prolapse    Sleep apnea    Urinary tract infection    Past Surgical History:  Procedure Laterality Date   bunyionectomy Left    CHOLECYSTECTOMY     ELBOW ARTHROSCOPY Left    LAPAROSCOPIC ASSISTED VAGINAL HYSTERECTOMY     SHOULDER ARTHROSCOPY Right    TONSILLECTOMY     Patient Active Problem List   Diagnosis Date Noted   Rectal prolapse 09/14/2022   Constipation 09/14/2022   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/2021   Left hip pain 10/12/2021   High cholesterol 03/07/2021   Moderate persistent asthma without complication 11/01/2020   Bereavement 02/06/2019   Bilateral temporomandibular joint pain 02/18/2018   Right leg numbness 05/21/2017   Squamous blepharitis of upper and lower eyelids of both eyes 05/21/2017   Meibomian gland dysfunction (MGD) of upper and lower lids of both eyes 02/20/2017   Chronic left-sided low back pain with left-sided sciatica 12/31/2016   OAB (overactive bladder) 08/29/2015   Hallux limitus 01/24/2015   Balance problems 05/12/2013   Lichen sclerosus 05/12/2013   Healthcare maintenance 01/12/2013   Essential tremor 07/08/2012   Gastro-esophageal reflux disease with esophagitis  07/02/2011   Fibromyalgia 05/27/2011   Keratoconjunctivitis sicca (HCC) 05/10/2011   Senile nuclear sclerosis 05/10/2011   Tear film insufficiency 05/10/2011   Essential (primary) hypertension 02/26/2011   Degenerative spinal arthritis 08/28/2000   Hashimoto's thyroiditis 08/28/1980    PCP: Myrlene Broker, MD   REFERRING PROVIDER: Major, Holley Dexter, PA-C  REFERRING DIAG: R15.9 (ICD-10-CM) - Full incontinence of feces   THERAPY DIAG:  Muscle weakness (generalized)  Abnormal posture  Unspecified lack of coordination  Rationale for Evaluation and Treatment: Rehabilitation  ONSET DATE: since colonoscopy in April 2023  SUBJECTIVE:  SUBJECTIVE STATEMENT: I have pain on the left side and it just came back today.  It was feeling better and the right side still doesn't hurt at all.  I had diarrhea from veggies but still no leakage.   Pt also feeling more pain in her neck more on the left.  I had PT for this two years ago and it got better and had no leakage, then began again since colonoscopy.  Currently fecal incontinence of bowel movements, without awareness, which occurs about 3 times a week.  Has abdominal pain and urgency without passing a bowel movement.  On mirilax helps.  I got pneumonia from aspirating during anesthesia when I had the colonoscopy Fluid intake: Yes: not much water, I forget to drink    PAIN:  PAIN:  Are you having pain? Yes NPRS scale: 5/10 (8/10 this morning when waking up) Pain location: left hip and neck Pain orientation: Lateral  PAIN TYPE: aching and sharp Pain description: intermittent  Aggravating factors: lying on it when sleeping and sitting a long time Relieving factors: up and moving taking pressure off    PRECAUTIONS: None  WEIGHT BEARING  RESTRICTIONS: No  FALLS:  Has patient fallen in last 6 months? Yes. 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, bible study, takes care of granddaughter  PLOF: Independent  PATIENT GOALS: not have incontinence  PERTINENT HISTORY:  history of HTN, arthritis, asthma, chronic constipation, hypothyroidism (most recent TFT's wnl), sleep apnea, essential tremor, fibromyalgia, overactive bladder, hysterectomy, and cholecystectomy Sexual abuse: No  BOWEL MOVEMENT: Pain with bowel movement: Yes Type of bowel movement:Type (Bristol Stool Scale) soft or diarrhea, Frequency daily and then skip a couple days, and Strain Yes sometimes Fully empty rectum: No Leakage: Yes: 3/week Pads: Yes: sometimes Fiber supplement: No  URINATION: Pain with urination: No Fully empty bladder: No Stream: Strong Urgency: No Frequency: medication to control  Leakage:  none Pads: No  INTERCOURSE:   PREGNANCY: Vaginal deliveries 2 Tearing Yes: both   PROLAPSE: None   OBJECTIVE:   DIAGNOSTIC FINDINGS:    PATIENT SURVEYS:    PFIQ-7   COGNITION: Overall cognitive status: Within functional limits for tasks assessed     SENSATION: Light touch:  Proprioception:   MUSCLE LENGTH: Hamstrings: Right 80 deg; Left 80 deg   LUMBAR SPECIAL TESTS:    FUNCTIONAL TESTS:    GAIT:  Comments: slow due to tremors  POSTURE: rounded shoulders and increased thoracic kyphosis, left hip more anterior rotation in stand and leaning left  PELVIC ALIGNMENT: left elevated and rotating forward in standing,   LUMBARAROM/PROM:  A/PROM A/PROM  eval  Flexion   Extension   Right lateral flexion   Left lateral flexion   Right rotation   Left rotation    (Blank rows = not tested)  LOWER EXTREMITY ROM:  Passive ROM Right eval Left eval  Hip flexion  75% 90%  Hip extension    Hip abduction    Hip adduction    Hip internal rotation    Hip external rotation 75% 90%  Knee flexion    Knee extension    Ankle dorsiflexion    Ankle plantarflexion    Ankle inversion    Ankle eversion     (Blank rows = not tested)  LOWER EXTREMITY MMT:  MMT Right 01/22/23  Left 01/22/23   Hip flexion 5 4+  Hip extension 5 4+  Hip abduction 4+ 4  Hip adduction 4+ 4  Hip internal rotation    Hip external rotation    Knee flexion    Knee extension    Ankle dorsiflexion    Ankle plantarflexion    Ankle inversion    Ankle eversion     PALPATION:   General                  External Perineal Exam no anal wink present, hemorrhoids                             Internal Pelvic Floor co-contracting with gluteals and diminished external anal sphincter  Patient confirms identification and approves PT to assess internal pelvic floor and treatment Yes  PELVIC MMT:   MMT eval  Vaginal   Internal Anal Sphincter   External Anal Sphincter 1/5  Puborectalis 3/5  Diastasis Recti   (Blank rows = not tested)        TONE: low  PROLAPSE: no  TODAY'S TREATMENT:                                                                                                                              DATE: 01/22/23  Manual: Left gluteal, left hip flexor, STM,  trigger point release with manual pressure and dry needling  Trigger Point Dry-Needling  Treatment instructions: Expect mild to moderate muscle soreness. S/S of pneumothorax if dry needled over a lung field, and to seek immediate medical attention should they occur. Patient verbalized understanding of these instructions and education.  Patient Consent Given: Yes Education handout provided: Previously provided Muscles treated: Rt glute med Electrical stimulation performed: No Parameters: N/A Treatment response/outcome: twitches and increased soft tissue length  Exercises: Lunge 2 ways on vibration plate 3x  30 sec bil  PATIENT EDUCATION:  Education details: Access Code: [email or web address] and added qped rocking and circles Person educated: Patient Education method: Explanation Education comprehension: verbalized understanding  HOME EXERCISE PROGRAM: Access Code: [email or web address] URL: [email or web address] Date: 12/11/2022 Prepared by: Dwana Curd  Exercises - Supine Hip Internal and External Rotation  - 1 x daily - 7 x weekly - 1 sets - 10 reps - 5 sec hold - Supine Upper Trunk and Cervical Rotation with Opposite Lower Trunk Rotation (Mirrored)  - 1 x daily - 7 x weekly - 1 sets - 10 reps - 5 hold - Sidelying Mid Thoracic Rotation (Mirrored)  - 1 x daily - 7 x weekly - 3 sets - 10 reps - Supine Transversus Abdominis Bracing - Hands on Ground  - 1 x daily - 7 x weekly - 3 sets - 10 reps  Patient Education - Trigger Point Dry Needling  ASSESSMENT:  CLINICAL IMPRESSION: Patient continues to have less pain today and no leakage or smearing.  Pt still has more tension on left side and manual techniques to address the  pain that is still occurring on the left side.  Pt feels like pain is lessening and manual techniques are longer lasting.  Pt was re-assessed and has hip weakness as noted.  Pt will benefit from more skilled PT to address the impairments to ensure pain is aliviated and does not return.  Pt also has a lot of neck pain on the left side.  As this is related to posture and pelvic floor activity related to posture it is advised to also address the neck pain.  Goals added to plan of care for neck pain and posture endurance.  Pt will benefit from skilled PT to work on improved muscle coordination for pain management and improved bowel control.  OBJECTIVE IMPAIRMENTS: decreased activity tolerance, decreased coordination, decreased endurance, decreased ROM, decreased strength, impaired flexibility, impaired sensation, impaired tone, postural dysfunction, and pain.   ACTIVITY  LIMITATIONS: continence and toileting  PARTICIPATION LIMITATIONS: community activity  PERSONAL FACTORS: 3+ comorbidities: history of HTN, arthritis, asthma, chronic constipation, hypothyroidism (most recent TFT's wnl), sleep apnea, essential tremor, fibromyalgia, overactive bladder, hysterectomy, and cholecystectomy  are also affecting patient's functional outcome.   REHAB POTENTIAL: Excellent  CLINICAL DECISION MAKING: Evolving/moderate complexity  EVALUATION COMPLEXITY: Moderate   GOALS: Goals reviewed with patient? Yes  SHORT TERM GOALS: Target date: 11/28/22  Pt will be ind with toileting techniques Baseline: Goal status: MET  2.  Pt will be ind with initial HEP Baseline:  Goal status: MET  3.  Pt will be able to isolate external anal sphincter Baseline: working with biofeedback today Goal status: IN PROGRESS   LONG TERM GOALS: Target date: 05/25/23  Pt will be independent with advanced HEP to maintain improvements made throughout therapy  Baseline:  Goal status: IN PROGRESS  2.  Pt will report 75% reduction of pain due to improvements in posture, strength, and muscle length  Baseline: 40% less overall Goal status: IN PROGRESS  3.  Pt will have 75% reduced leakage during a typical week.  Baseline: no leakage Goal status: MET  4.  Pt will have more solid stool for improved emptying Baseline:  Goal status: IN PROGRESS  5.  Pt will have neck pain reduced to 2/10 at most during typical day Baseline: 8/10 Goal status: INITIAL     PLAN:  PT FREQUENCY: 1x/week  PT DURATION: 12 weeks  PLANNED INTERVENTIONS: Therapeutic exercises, Therapeutic activity, Neuromuscular re-education, Balance training, Gait training, Patient/Family education, Self Care, Joint mobilization, Dry Needling, Electrical stimulation, Cryotherapy, Moist heat, scar mobilization, Taping, Traction, Ultrasound, Biofeedback, Manual therapy, and Re-evaluation  PLAN FOR NEXT SESSION: assess  cervical and do dry needling for tension and pain management; dry needling #6 gluteals if trigger points;   Brayton Caves Raynard Mapps, PT 01/22/2023, 1:15 PM

## 2023-01-31 ENCOUNTER — Ambulatory Visit: Payer: Medicare Other | Admitting: Physical Therapy

## 2023-01-31 ENCOUNTER — Encounter: Payer: Self-pay | Admitting: Physical Therapy

## 2023-01-31 DIAGNOSIS — M6281 Muscle weakness (generalized): Secondary | ICD-10-CM | POA: Diagnosis not present

## 2023-01-31 DIAGNOSIS — R279 Unspecified lack of coordination: Secondary | ICD-10-CM

## 2023-01-31 DIAGNOSIS — R293 Abnormal posture: Secondary | ICD-10-CM

## 2023-01-31 NOTE — Therapy (Signed)
OUTPATIENT PHYSICAL THERAPY FEMALE PELVIC TREATMENT   Patient Name: Mariah Snyder MRN: [id number] DOB:December 03, 1945, 77 y.o., female Today's Date: 01/31/2023  END OF SESSION:  PT End of Session - 01/31/23 1539     Visit Number 9    Date for PT Re-Evaluation 05/25/23    Authorization Type medicare    PT Start Time 1536    PT Stop Time 1615    PT Time Calculation (min) 39 min    Activity Tolerance Patient tolerated treatment well    Behavior During Therapy WFL for tasks assessed/performed                    Past Medical History:  Diagnosis Date   Anemia    Anxiety    Arthritis    Asthma    Fibromyalgia    Gallstones    GERD (gastroesophageal reflux disease)    Hyperlipidemia    Hypertension    Hypothyroidism    Kidney stones    Peptic ulcer    Rectal prolapse    Sleep apnea    Urinary tract infection    Past Surgical History:  Procedure Laterality Date   bunyionectomy Left    CHOLECYSTECTOMY     ELBOW ARTHROSCOPY Left    LAPAROSCOPIC ASSISTED VAGINAL HYSTERECTOMY     SHOULDER ARTHROSCOPY Right    TONSILLECTOMY     Patient Active Problem List   Diagnosis Date Noted   Rectal prolapse 09/14/2022   Constipation 09/14/2022   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/2021   Left hip pain 10/12/2021   High cholesterol 03/07/2021   Moderate persistent asthma without complication 11/01/2020   Bereavement 02/06/2019   Bilateral temporomandibular joint pain 02/18/2018   Right leg numbness 05/21/2017   Squamous blepharitis of upper and lower eyelids of both eyes 05/21/2017   Meibomian gland dysfunction (MGD) of upper and lower lids of both eyes 02/20/2017   Chronic left-sided low back pain with left-sided sciatica 12/31/2016   OAB (overactive bladder) 08/29/2015   Hallux limitus 01/24/2015   Balance problems 05/12/2013   Lichen sclerosus 05/12/2013   Healthcare maintenance 01/12/2013   Essential tremor 07/08/2012   Gastro-esophageal reflux disease with esophagitis  07/02/2011   Fibromyalgia 05/27/2011   Keratoconjunctivitis sicca (HCC) 05/10/2011   Senile nuclear sclerosis 05/10/2011   Tear film insufficiency 05/10/2011   Essential (primary) hypertension 02/26/2011   Degenerative spinal arthritis 08/28/2000   Hashimoto's thyroiditis 08/28/1980    PCP: Myrlene Broker, MD   REFERRING PROVIDER: Major, Holley Dexter, PA-C  REFERRING DIAG: R15.9 (ICD-10-CM) - Full incontinence of feces   THERAPY DIAG:  Muscle weakness (generalized)  Abnormal posture  Unspecified lack of coordination  Rationale for Evaluation and Treatment: Rehabilitation  ONSET DATE: since colonoscopy in April 2023  SUBJECTIVE:  SUBJECTIVE STATEMENT: I have pain on the left side (hip) sometimes.  It is not like it was.  The Neck on the left is hurting and cannot turn my head to the left.  I was able to hold the bowel movements longer.  Only one accident this week.  I had PT for this two years ago and it got better and had no leakage, then began again since colonoscopy.  Currently fecal incontinence of bowel movements, without awareness, which occurs about 3 times a week.  Has abdominal pain and urgency without passing a bowel movement.  On mirilax helps.  I got pneumonia from aspirating during anesthesia when I had the colonoscopy Fluid intake: Yes: not much water, I forget to drink    PAIN:  PAIN:  Are you having pain? Yes NPRS scale: 5/10 (8/10 this morning when waking up) Pain location: left hip and neck Pain orientation: Lateral  PAIN TYPE: aching and sharp Pain description: intermittent  Aggravating factors: lying on it when sleeping and sitting a long time Relieving factors: up and moving taking pressure off    PRECAUTIONS: None  WEIGHT BEARING RESTRICTIONS: No  FALLS:   Has patient fallen in last 6 months? Yes. 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, bible study, takes care of granddaughter  PLOF: Independent  PATIENT GOALS: not have incontinence  PERTINENT HISTORY:  history of HTN, arthritis, asthma, chronic constipation, hypothyroidism (most recent TFT's wnl), sleep apnea, essential tremor, fibromyalgia, overactive bladder, hysterectomy, and cholecystectomy Sexual abuse: No  BOWEL MOVEMENT: Pain with bowel movement: Yes Type of bowel movement:Type (Bristol Stool Scale) soft or diarrhea, Frequency daily and then skip a couple days, and Strain Yes sometimes Fully empty rectum: No Leakage: Yes: 3/week Pads: Yes: sometimes Fiber supplement: No  URINATION: Pain with urination: No Fully empty bladder: No Stream: Strong Urgency: No Frequency: medication to control  Leakage:  none Pads: No  INTERCOURSE:   PREGNANCY: Vaginal deliveries 2 Tearing Yes: both   PROLAPSE: None   OBJECTIVE:   DIAGNOSTIC FINDINGS:    PATIENT SURVEYS:    PFIQ-7   COGNITION: Overall cognitive status: Within functional limits for tasks assessed     SENSATION: Light touch:  Proprioception:   MUSCLE LENGTH: Hamstrings: Right 80 deg; Left 80 deg   LUMBAR SPECIAL TESTS:    FUNCTIONAL TESTS:    GAIT:  Comments: slow due to tremors  POSTURE: rounded shoulders and increased thoracic kyphosis, left hip more anterior rotation in stand and leaning left  PELVIC ALIGNMENT: left elevated and rotating forward in standing,   LUMBARAROM/PROM:  A/PROM A/PROM  eval  Flexion   Extension   Right lateral flexion   Left lateral flexion   Right rotation   Left rotation    (Blank rows = not tested)  LOWER EXTREMITY ROM:  Passive ROM Right eval Left eval  Hip flexion 75% 90%  Hip extension     Hip abduction    Hip adduction    Hip internal rotation    Hip external rotation 75% 90%  Knee flexion    Knee extension    Ankle dorsiflexion    Ankle plantarflexion    Ankle inversion    Ankle eversion     (Blank rows = not tested)  LOWER EXTREMITY MMT:  MMT Right 01/22/23  Left 01/22/23   Hip flexion 5 4+  Hip extension 5  4+  Hip abduction 4+ 4  Hip adduction 4+ 4  Hip internal rotation    Hip external rotation    Knee flexion    Knee extension    Ankle dorsiflexion    Ankle plantarflexion    Ankle inversion    Ankle eversion     PALPATION:   General                  External Perineal Exam no anal wink present, hemorrhoids                             Internal Pelvic Floor co-contracting with gluteals and diminished external anal sphincter  Patient confirms identification and approves PT to assess internal pelvic floor and treatment Yes  PELVIC MMT:   MMT eval  Vaginal   Internal Anal Sphincter   External Anal Sphincter 1/5  Puborectalis 3/5  Diastasis Recti   (Blank rows = not tested)        TONE: low  PROLAPSE: No   Cervical Rotation Left: 26 deg then it hurts  TODAY'S TREATMENT:                                                                                                                              DATE: 01/31/23  Manual: Cerical paraspinals, scalenes, SCM (Rt) suboccipitals, cervical traction and passive ROM, STM,  trigger point release with manual pressure and dry needling  Trigger Point Dry-Needling  Treatment instructions: Expect mild to moderate muscle soreness. S/S of pneumothorax if dry needled over a lung field, and to seek immediate medical attention should they occur. Patient verbalized understanding of these instructions and education.  Patient Consent Given: Yes Education handout provided: Previously provided Muscles treated: bil suboccipitals, cervical paraspinals Electrical stimulation performed: No Parameters:  N/A Treatment response/outcome: twitches and increased soft tissue length  Exercises: Upper trap stretches, ROM, scalene stretch  PATIENT EDUCATION:  Education details: Access Code: [email or web address] and added qped rocking and circles Person educated: Patient Education method: Explanation Education comprehension: verbalized understanding  HOME EXERCISE PROGRAM: Access Code: [email or web address] URL: [email or web address] Date: 12/11/2022 Prepared by: Dwana Curd  Exercises - Supine Hip Internal and External Rotation  - 1 x daily - 7 x weekly - 1 sets - 10 reps - 5 sec hold - Supine Upper Trunk and Cervical Rotation with Opposite Lower Trunk Rotation (Mirrored)  - 1 x daily - 7 x weekly - 1 sets - 10 reps - 5 hold - Sidelying Mid Thoracic Rotation (Mirrored)  - 1 x daily - 7 x weekly - 3 sets - 10 reps - Supine Transversus Abdominis Bracing - Hands on Ground  - 1 x daily - 7 x weekly - 3 sets - 10 reps  Patient Education - Trigger Point Dry Needling  ASSESSMENT:  CLINICAL IMPRESSION: Pt working on posture with improved cervical ROM as well as reduced neck pain as part of  overall posture restoration.  Palpation of some increased soft tissue length.  Pt reports no change in pain after today's treatment. Pt was given stretches to continue to work on cervical mobility.  Pt will benefit from skilled PT to work on improved muscle coordination for pain management and improved bowel control.  OBJECTIVE IMPAIRMENTS: decreased activity tolerance, decreased coordination, decreased endurance, decreased ROM, decreased strength, impaired flexibility, impaired sensation, impaired tone, postural dysfunction, and pain.   ACTIVITY LIMITATIONS: continence and toileting  PARTICIPATION LIMITATIONS: community activity  PERSONAL FACTORS: 3+ comorbidities: history of HTN, arthritis, asthma, chronic constipation, hypothyroidism (most recent TFT's wnl), sleep apnea, essential tremor, fibromyalgia,  overactive bladder, hysterectomy, and cholecystectomy  are also affecting patient's functional outcome.   REHAB POTENTIAL: Excellent  CLINICAL DECISION MAKING: Evolving/moderate complexity  EVALUATION COMPLEXITY: Moderate   GOALS: Goals reviewed with patient? Yes  SHORT TERM GOALS: Target date: 11/28/22  Pt will be ind with toileting techniques Baseline: Goal status: MET  2.  Pt will be ind with initial HEP Baseline:  Goal status: MET  3.  Pt will be able to isolate external anal sphincter Baseline: working with biofeedback today Goal status: IN PROGRESS   LONG TERM GOALS: Target date: 05/25/23  Pt will be independent with advanced HEP to maintain improvements made throughout therapy  Baseline:  Goal status: IN PROGRESS  2.  Pt will report 75% reduction of pain due to improvements in posture, strength, and muscle length  Baseline: 40% less overall Goal status: IN PROGRESS  3.  Pt will have 75% reduced leakage during a typical week.  Baseline: no leakage Goal status: MET  4.  Pt will have more solid stool for improved emptying Baseline:  Goal status: IN PROGRESS  5.  Pt will have neck pain reduced to 2/10 at most during typical day Baseline: 8/10 Goal status: IN PROGRESS     PLAN:  PT FREQUENCY: 1x/week  PT DURATION: 12 weeks  PLANNED INTERVENTIONS: Therapeutic exercises, Therapeutic activity, Neuromuscular re-education, Balance training, Gait training, Patient/Family education, Self Care, Joint mobilization, Dry Needling, Electrical stimulation, Cryotherapy, Moist heat, scar mobilization, Taping, Traction, Ultrasound, Biofeedback, Manual therapy, and Re-evaluation  PLAN FOR NEXT SESSION: f/u on cervical and dry needling #2 cervical; dry needling #6 gluteals if trigger points; hip strength   Brayton Caves Marsa Matteo, PT 01/31/2023, 3:39 PM

## 2023-02-06 ENCOUNTER — Other Ambulatory Visit: Payer: Self-pay

## 2023-02-06 ENCOUNTER — Encounter: Payer: Self-pay | Admitting: Internal Medicine

## 2023-02-06 MED ORDER — LEVOTHYROXINE SODIUM 112 MCG PO TABS: 112.0000 ug | ORAL_TABLET | Freq: Every day | ORAL | 3 refills | Status: DC

## 2023-02-13 ENCOUNTER — Encounter: Payer: Self-pay | Admitting: Physical Therapy

## 2023-02-13 ENCOUNTER — Ambulatory Visit: Payer: Medicare Other | Admitting: Physical Therapy

## 2023-02-13 DIAGNOSIS — R279 Unspecified lack of coordination: Secondary | ICD-10-CM

## 2023-02-13 DIAGNOSIS — R293 Abnormal posture: Secondary | ICD-10-CM

## 2023-02-13 DIAGNOSIS — M6281 Muscle weakness (generalized): Secondary | ICD-10-CM | POA: Diagnosis not present

## 2023-02-13 NOTE — Therapy (Signed)
OUTPATIENT PHYSICAL THERAPY FEMALE PELVIC TREATMENT Progress Note Reporting Period 10/31/22 to 02/13/23   See note below for Objective Data and Assessment of Progress/Goals.      Patient Name: Mariah Snyder MRN: [id number] DOB:1946/09/03, 77 y.o., female Today's Date: 02/13/2023  END OF SESSION:  PT End of Session - 02/13/23 1144     Visit Number 10    Date for PT Re-Evaluation 05/25/23    Authorization Type medicare    Progress Note Due on Visit 20    PT Start Time 1144    PT Stop Time 1223    PT Time Calculation (min) 39 min    Activity Tolerance Patient tolerated treatment well    Behavior During Therapy WFL for tasks assessed/performed                     Past Medical History:  Diagnosis Date   Anemia    Anxiety    Arthritis    Asthma    Fibromyalgia    Gallstones    GERD (gastroesophageal reflux disease)    Hyperlipidemia    Hypertension    Hypothyroidism    Kidney stones    Peptic ulcer    Rectal prolapse    Sleep apnea    Urinary tract infection    Past Surgical History:  Procedure Laterality Date   bunyionectomy Left    CHOLECYSTECTOMY     ELBOW ARTHROSCOPY Left    LAPAROSCOPIC ASSISTED VAGINAL HYSTERECTOMY     SHOULDER ARTHROSCOPY Right    TONSILLECTOMY     Patient Active Problem List   Diagnosis Date Noted   Rectal prolapse 09/14/2022   Constipation 09/14/2022   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/2021   Left hip pain 10/12/2021   High cholesterol 03/07/2021   Moderate persistent asthma without complication 11/01/2020   Bereavement 02/06/2019   Bilateral temporomandibular joint pain 02/18/2018   Right leg numbness 05/21/2017   Squamous blepharitis of upper and lower eyelids of both eyes 05/21/2017   Meibomian gland dysfunction (MGD) of upper and lower lids of both eyes 02/20/2017   Chronic left-sided low back pain with left-sided sciatica 12/31/2016   OAB (overactive bladder) 08/29/2015   Hallux limitus 01/24/2015   Balance  problems 05/12/2013   Lichen sclerosus 05/12/2013   Healthcare maintenance 01/12/2013   Essential tremor 07/08/2012   Gastro-esophageal reflux disease with esophagitis 07/02/2011   Fibromyalgia 05/27/2011   Keratoconjunctivitis sicca (HCC) 05/10/2011   Senile nuclear sclerosis 05/10/2011   Tear film insufficiency 05/10/2011   Essential (primary) hypertension 02/26/2011   Degenerative spinal arthritis 08/28/2000   Hashimoto's thyroiditis 08/28/1980    PCP: Myrlene Broker, MD   REFERRING PROVIDER: Major, Holley Dexter, PA-C  REFERRING DIAG: R15.9 (ICD-10-CM) - Full incontinence of feces   THERAPY DIAG:  Muscle weakness (generalized)  Abnormal posture  Unspecified lack of coordination  Rationale for Evaluation and Treatment: Rehabilitation  ONSET DATE: since colonoscopy in April 2023  SUBJECTIVE:  SUBJECTIVE STATEMENT: I am in pain now in my low back when standing it is 8/10, walking it comes down to a 4/10.  I realized I cannot step up with the left side because it hurts my hip. The neck pain has been better since doing the dry needling.  I had PT for this two years ago and it got better and had no leakage, then began again since colonoscopy.  Currently fecal incontinence of bowel movements, without awareness, which occurs about 3 times a week.  Has abdominal pain and urgency without passing a bowel movement.  On mirilax helps.  I got pneumonia from aspirating during anesthesia when I had the colonoscopy Fluid intake: Yes: not much water, I forget to drink    PAIN:  PAIN:  Are you having pain? Yes NPRS scale: 4/10 (8/10 standing) Pain location: left hip and neck Pain orientation: Lateral  PAIN TYPE: aching and sharp Pain description: intermittent  Aggravating factors: lying on it when  sleeping and sitting a long time Relieving factors: up and moving taking pressure off    PRECAUTIONS: None  WEIGHT BEARING RESTRICTIONS: No  FALLS:  Has patient fallen in last 6 months? Yes. 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, bible study, takes care of granddaughter  PLOF: Independent  PATIENT GOALS: not have incontinence  PERTINENT HISTORY:  history of HTN, arthritis, asthma, chronic constipation, hypothyroidism (most recent TFT's wnl), sleep apnea, essential tremor, fibromyalgia, overactive bladder, hysterectomy, and cholecystectomy Sexual abuse: No  BOWEL MOVEMENT: Pain with bowel movement: Yes Type of bowel movement:Type (Bristol Stool Scale) soft or diarrhea, Frequency daily and then skip a couple days, and Strain Yes sometimes Fully empty rectum: No Leakage: Yes: 3/week Pads: Yes: sometimes Fiber supplement: No  URINATION: Pain with urination: No Fully empty bladder: No Stream: Strong Urgency: No Frequency: medication to control  Leakage:  none Pads: No  INTERCOURSE:   PREGNANCY: Vaginal deliveries 2 Tearing Yes: both   PROLAPSE: None   OBJECTIVE:   DIAGNOSTIC FINDINGS:    PATIENT SURVEYS:    PFIQ-7   COGNITION: Overall cognitive status: Within functional limits for tasks assessed     SENSATION: Light touch:  Proprioception:   MUSCLE LENGTH: Hamstrings: Right 80 deg; Left 80 deg   LUMBAR SPECIAL TESTS:    FUNCTIONAL TESTS:    GAIT:  Comments: slow due to tremors  POSTURE: rounded shoulders and increased thoracic kyphosis, left hip more anterior rotation in stand and leaning left  PELVIC ALIGNMENT: left elevated and rotating forward in standing,   LUMBARAROM/PROM:  A/PROM A/PROM  eval  Flexion   Extension   Right lateral flexion   Left lateral flexion   Right  rotation   Left rotation    (Blank rows = not tested)  LOWER EXTREMITY ROM:  Passive ROM Right eval Left eval  Hip flexion 75% 90%  Hip extension    Hip abduction    Hip adduction    Hip internal rotation    Hip external rotation 75% 90%  Knee flexion    Knee extension    Ankle dorsiflexion    Ankle plantarflexion    Ankle inversion    Ankle eversion     (Blank rows = not tested)  LOWER EXTREMITY MMT:  MMT Right 01/22/23  Left 01/22/23   Hip flexion 5 4+  Hip extension 5 4+  Hip abduction  4+ 4  Hip adduction 4+ 4  Hip internal rotation    Hip external rotation    Knee flexion    Knee extension    Ankle dorsiflexion    Ankle plantarflexion    Ankle inversion    Ankle eversion     PALPATION:   General                  External Perineal Exam no anal wink present, hemorrhoids                             Internal Pelvic Floor co-contracting with gluteals and diminished external anal sphincter  Patient confirms identification and approves PT to assess internal pelvic floor and treatment Yes  PELVIC MMT:   MMT eval  Vaginal   Internal Anal Sphincter   External Anal Sphincter 1/5  Puborectalis 3/5  Diastasis Recti   (Blank rows = not tested)        TONE: low  PROLAPSE: No   Cervical Rotation Left: 26 deg then it hurts  TODAY'S TREATMENT:                                                                                                                              DATE: 02/13/23  Manual: Cerical paraspinals, suboccipitals,  trigger point release with manual pressure and dry needling , lumbar paraspinals and QL bil Trigger Point Dry-Needling  Treatment instructions: Expect mild to moderate muscle soreness. S/S of pneumothorax if dry needled over a lung field, and to seek immediate medical attention should they occur. Patient verbalized understanding of these instructions and education.  Patient Consent Given: Yes Education handout provided: Previously  provided Muscles treated: bil suboccipitals, cervical paraspinals, lumbar multifidi, bil QL Electrical stimulation performed: No Parameters: N/A Treatment response/outcome: twitches and increased soft tissue length  Exercises: Hip machine - abduction and ext - 15lb and 15x bil  Vibration plate h/s stretch - 60 sec  PATIENT EDUCATION:  Education details: Access Code: [email or web address] and added qped rocking and circles Person educated: Patient Education method: Explanation Education comprehension: verbalized understanding  HOME EXERCISE PROGRAM: Access Code: [email or web address] URL: [email or web address] Date: 12/11/2022 Prepared by: Dwana Curd  Exercises - Supine Hip Internal and External Rotation  - 1 x daily - 7 x weekly - 1 sets - 10 reps - 5 sec hold - Supine Upper Trunk and Cervical Rotation with Opposite Lower Trunk Rotation (Mirrored)  - 1 x daily - 7 x weekly - 1 sets - 10 reps - 5 hold - Sidelying Mid Thoracic Rotation (Mirrored)  - 1 x daily - 7 x weekly - 3 sets - 10 reps - Supine Transversus Abdominis Bracing - Hands on Ground  - 1 x daily - 7 x weekly - 3 sets - 10 reps  Patient Education - Trigger Point Dry Needling  ASSESSMENT:  CLINICAL IMPRESSION: Pt was in more pain today due to  traveling.  Today's session continued working out muscle spasms and was given exercises at machine for activation of gluteals since pt has been more sedentary while traveling.Nothing new added to HEP but encouraged to get right back to previous HEP.  Pt will benefit from skilled PT as she is expected to continue to make improvements.  Needed to work on improved muscle coordination for pain management and improved bowel control.  OBJECTIVE IMPAIRMENTS: decreased activity tolerance, decreased coordination, decreased endurance, decreased ROM, decreased strength, impaired flexibility, impaired sensation, impaired tone, postural dysfunction, and pain.   ACTIVITY LIMITATIONS: continence  and toileting  PARTICIPATION LIMITATIONS: community activity  PERSONAL FACTORS: 3+ comorbidities: history of HTN, arthritis, asthma, chronic constipation, hypothyroidism (most recent TFT's wnl), sleep apnea, essential tremor, fibromyalgia, overactive bladder, hysterectomy, and cholecystectomy  are also affecting patient's functional outcome.   REHAB POTENTIAL: Excellent  CLINICAL DECISION MAKING: Evolving/moderate complexity  EVALUATION COMPLEXITY: Moderate   GOALS: Goals reviewed with patient? Yes  SHORT TERM GOALS: Target date: 11/28/22  Pt will be ind with toileting techniques Baseline: Goal status: MET  2.  Pt will be ind with initial HEP Baseline:  Goal status: MET  3.  Pt will be able to isolate external anal sphincter Baseline: working with biofeedback today Goal status: IN PROGRESS   LONG TERM GOALS: Target date: 05/25/23 Updated 02/13/23  Pt will be independent with advanced HEP to maintain improvements made throughout therapy  Baseline:  Goal status: IN PROGRESS  2.  Pt will report 75% reduction of pain due to improvements in posture, strength, and muscle length  Baseline: 40% less overall Goal status: IN PROGRESS  3.  Pt will have 75% reduced leakage during a typical week.  Baseline: no leakage Goal status: MET  4.  Pt will have more solid stool for improved emptying Baseline:  Goal status: IN PROGRESS  5.  Pt will have neck pain reduced to 2/10 at most during typical day Baseline: 8/10 Goal status: IN PROGRESS     PLAN:  PT FREQUENCY: 1x/week  PT DURATION: 12 weeks  PLANNED INTERVENTIONS: Therapeutic exercises, Therapeutic activity, Neuromuscular re-education, Balance training, Gait training, Patient/Family education, Self Care, Joint mobilization, Dry Needling, Electrical stimulation, Cryotherapy, Moist heat, scar mobilization, Taping, Traction, Ultrasound, Biofeedback, Manual therapy, and Re-evaluation  PLAN FOR NEXT SESSION: f/u on cervical  and dry needling #3 cervical; dry needling #6 gluteals, QL and lumbar if trigger points; hip strength - modified hip hinging and working on left hip extension   [employer], PT 02/13/2023, 11:45 AM

## 2023-02-20 ENCOUNTER — Encounter: Payer: Self-pay | Admitting: Physical Therapy

## 2023-02-20 ENCOUNTER — Ambulatory Visit: Payer: Medicare Other | Attending: Physician Assistant | Admitting: Physical Therapy

## 2023-02-20 DIAGNOSIS — M6281 Muscle weakness (generalized): Secondary | ICD-10-CM | POA: Diagnosis present

## 2023-02-20 DIAGNOSIS — R279 Unspecified lack of coordination: Secondary | ICD-10-CM | POA: Diagnosis present

## 2023-02-20 DIAGNOSIS — R293 Abnormal posture: Secondary | ICD-10-CM | POA: Diagnosis present

## 2023-02-20 NOTE — Therapy (Signed)
OUTPATIENT PHYSICAL THERAPY FEMALE PELVIC TREATMENT      Patient Name: Mariah Snyder MRN: [id number] DOB:[birth year], 77 y.o., female Today's Date: 02/20/2023  END OF SESSION:  PT End of Session - 02/20/23 1539     Visit Number 11    Date for PT Re-Evaluation 05/25/23    Authorization Type medicare    Progress Note Due on Visit 20    PT Start Time 1533    PT Stop Time 1613    PT Time Calculation (min) 40 min    Activity Tolerance Patient tolerated treatment well    Behavior During Therapy WFL for tasks assessed/performed                      Past Medical History:  Diagnosis Date   Anemia    Anxiety    Arthritis    Asthma    Fibromyalgia    Gallstones    GERD (gastroesophageal reflux disease)    Hyperlipidemia    Hypertension    Hypothyroidism    Kidney stones    Peptic ulcer    Rectal prolapse    Sleep apnea    Urinary tract infection    Past Surgical History:  Procedure Laterality Date   bunyionectomy Left    CHOLECYSTECTOMY     ELBOW ARTHROSCOPY Left    LAPAROSCOPIC ASSISTED VAGINAL HYSTERECTOMY     SHOULDER ARTHROSCOPY Right    TONSILLECTOMY     Patient Active Problem List   Diagnosis Date Noted   Rectal prolapse 09/14/2022   Constipation 09/14/2022   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/2021   Left hip pain 10/12/2021   High cholesterol 03/07/2021   Moderate persistent asthma without complication 11/01/2020   Bereavement 02/06/2019   Bilateral temporomandibular joint pain 02/18/2018   Right leg numbness 05/21/2017   Squamous blepharitis of upper and lower eyelids of both eyes 05/21/2017   Meibomian gland dysfunction (MGD) of upper and lower lids of both eyes 02/20/2017   Chronic left-sided low back pain with left-sided sciatica 12/31/2016   OAB (overactive bladder) 08/29/2015   Hallux limitus 01/24/2015   Balance problems 05/12/2013   Lichen sclerosus 05/12/2013   Healthcare maintenance 01/12/2013   Essential tremor 07/08/2012    Gastro-esophageal reflux disease with esophagitis 07/02/2011   Fibromyalgia 05/27/2011   Keratoconjunctivitis sicca (HCC) 05/10/2011   Senile nuclear sclerosis 05/10/2011   Tear film insufficiency 05/10/2011   Essential (primary) hypertension 02/26/2011   Degenerative spinal arthritis 08/28/2000   Hashimoto's thyroiditis 08/28/1980    PCP: Myrlene Broker, MD   REFERRING PROVIDER: Major, Holley Dexter, PA-C  REFERRING DIAG: R15.9 (ICD-10-CM) - Full incontinence of feces   THERAPY DIAG:  Muscle weakness (generalized)  Abnormal posture  Unspecified lack of coordination  Rationale for Evaluation and Treatment: Rehabilitation  ONSET DATE: since colonoscopy in April 2023  SUBJECTIVE:  SUBJECTIVE STATEMENT: I have pain in the left hip today. Pt had a big fall yesterday when tripping on a hill in backyard.  Pt also fell on shoulder and upper traps very tight causing more stiffness in neck and unable to turn after she had been feeling better last week.  I had PT for this two years ago and it got better and had no leakage, then began again since colonoscopy.  Currently fecal incontinence of bowel movements, without awareness, which occurs about 3 times a week.  Has abdominal pain and urgency without passing a bowel movement.  On mirilax helps.  I got pneumonia from aspirating during anesthesia when I had the colonoscopy Fluid intake: Yes: not much water, I forget to drink    PAIN:  PAIN:  Are you having pain? Yes NPRS scale: 4/10 (8/10 standing) Pain location: left hip and neck Pain orientation: Lateral  PAIN TYPE: aching and sharp Pain description: intermittent  Aggravating factors: lying on it when sleeping and sitting a long time Relieving factors: up and moving taking pressure off     PRECAUTIONS: None  WEIGHT BEARING RESTRICTIONS: No  FALLS:  Has patient fallen in last 6 months? Yes. 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, bible study, takes care of granddaughter  PLOF: Independent  PATIENT GOALS: not have incontinence  PERTINENT HISTORY:  history of HTN, arthritis, asthma, chronic constipation, hypothyroidism (most recent TFT's wnl), sleep apnea, essential tremor, fibromyalgia, overactive bladder, hysterectomy, and cholecystectomy Sexual abuse: No  BOWEL MOVEMENT: Pain with bowel movement: Yes Type of bowel movement:Type (Bristol Stool Scale) soft or diarrhea, Frequency daily and then skip a couple days, and Strain Yes sometimes Fully empty rectum: No Leakage: Yes: 3/week Pads: Yes: sometimes Fiber supplement: No  URINATION: Pain with urination: No Fully empty bladder: No Stream: Strong Urgency: No Frequency: medication to control  Leakage:  none Pads: No  INTERCOURSE:   PREGNANCY: Vaginal deliveries 2 Tearing Yes: both   PROLAPSE: None   OBJECTIVE:   DIAGNOSTIC FINDINGS:    PATIENT SURVEYS:    PFIQ-7   COGNITION: Overall cognitive status: Within functional limits for tasks assessed     SENSATION: Light touch:  Proprioception:   MUSCLE LENGTH: Hamstrings: Right 80 deg; Left 80 deg   LUMBAR SPECIAL TESTS:    FUNCTIONAL TESTS:    GAIT:  Comments: slow due to tremors  POSTURE: rounded shoulders and increased thoracic kyphosis, left hip more anterior rotation in stand and leaning left  PELVIC ALIGNMENT: left elevated and rotating forward in standing,   LUMBARAROM/PROM:  A/PROM A/PROM  eval  Flexion   Extension   Right lateral flexion   Left lateral flexion   Right rotation   Left rotation    (Blank rows = not tested)  LOWER EXTREMITY ROM:  Passive  ROM Right eval Left eval  Hip flexion 75% 90%  Hip extension    Hip abduction    Hip adduction    Hip internal rotation    Hip external rotation 75% 90%  Knee flexion    Knee extension    Ankle dorsiflexion    Ankle plantarflexion    Ankle inversion    Ankle eversion     (Blank rows = not tested)  LOWER EXTREMITY MMT:  MMT Right 01/22/23  Left 01/22/23   Hip flexion 5 4+  Hip extension 5 4+  Hip  abduction 4+ 4  Hip adduction 4+ 4  Hip internal rotation    Hip external rotation    Knee flexion    Knee extension    Ankle dorsiflexion    Ankle plantarflexion    Ankle inversion    Ankle eversion     PALPATION:   General                  External Perineal Exam no anal wink present, hemorrhoids                             Internal Pelvic Floor co-contracting with gluteals and diminished external anal sphincter  Patient confirms identification and approves PT to assess internal pelvic floor and treatment Yes  PELVIC MMT:   MMT eval  Vaginal   Internal Anal Sphincter   External Anal Sphincter 1/5  Puborectalis 3/5  Diastasis Recti   (Blank rows = not tested)        TONE: low  PROLAPSE: No   Cervical Rotation Left: 26 deg then it hurts  TODAY'S TREATMENT:                                                                                                                              DATE: 02/20/23  Manual: SCM and scalenes Rt, bil masseter, Lt upper trap, left glute med and piriformis and hip flexor Trigger Point Dry-Needling  Treatment instructions: Expect mild to moderate muscle soreness. S/S of pneumothorax if dry needled over a lung field, and to seek immediate medical attention should they occur. Patient verbalized understanding of these instructions and education.  Patient Consent Given: Yes Education handout provided: Previously provided Muscles treated: SCM and scalenes Rt, bil masseter, Lt upper trap, left glute med and piriformis and hip  flexor Electrical stimulation performed: No Parameters: N/A Treatment response/outcome: twitch response and increased soft tissue length    02/13/23  Manual: Cerical paraspinals, suboccipitals,  trigger point release with manual pressure and dry needling , lumbar paraspinals and QL bil Trigger Point Dry-Needling  Treatment instructions: Expect mild to moderate muscle soreness. S/S of pneumothorax if dry needled over a lung field, and to seek immediate medical attention should they occur. Patient verbalized understanding of these instructions and education.  Patient Consent Given: Yes Education handout provided: Previously provided Muscles treated: bil suboccipitals, cervical paraspinals, lumbar multifidi, bil QL Electrical stimulation performed: No Parameters: N/A Treatment response/outcome: twitches and increased soft tissue length  Exercises: Hip machine - abduction and ext - 15lb and 15x bil  Vibration plate h/s stretch - 60 sec  PATIENT EDUCATION:  Education details: Access Code: [email or web address] and added qped rocking and circles Person educated: Patient Education method: Explanation Education comprehension: verbalized understanding  HOME EXERCISE PROGRAM: Access Code: [email or web address] URL: [email or web address] Date: 12/11/2022 Prepared by: Dwana Curd  Exercises - Supine Hip Internal and External Rotation  - 1 x daily - 7 x weekly -  1 sets - 10 reps - 5 sec hold - Supine Upper Trunk and Cervical Rotation with Opposite Lower Trunk Rotation (Mirrored)  - 1 x daily - 7 x weekly - 1 sets - 10 reps - 5 hold - Sidelying Mid Thoracic Rotation (Mirrored)  - 1 x daily - 7 x weekly - 3 sets - 10 reps - Supine Transversus Abdominis Bracing - Hands on Ground  - 1 x daily - 7 x weekly - 3 sets - 10 reps  Patient Education - Trigger Point Dry Needling  ASSESSMENT:  CLINICAL IMPRESSION: Today's session continues to focus on pain management due to pt fall yesterday.  Pt  had limited cervical ROM and felt pain with any rotation left.  Pt responded well to manaual techniques with decreased tension and able to perform cervical rotation after today's session. Pt reports less hip pain.  Pt continues to need skilled PT to work on strength and posture along with pain mangagement.  Pt will benefit from skilled PT as she is expected to continue to make improvements.  OBJECTIVE IMPAIRMENTS: decreased activity tolerance, decreased coordination, decreased endurance, decreased ROM, decreased strength, impaired flexibility, impaired sensation, impaired tone, postural dysfunction, and pain.   ACTIVITY LIMITATIONS: continence and toileting  PARTICIPATION LIMITATIONS: community activity  PERSONAL FACTORS: 3+ comorbidities: history of HTN, arthritis, asthma, chronic constipation, hypothyroidism (most recent TFT's wnl), sleep apnea, essential tremor, fibromyalgia, overactive bladder, hysterectomy, and cholecystectomy  are also affecting patient's functional outcome.   REHAB POTENTIAL: Excellent  CLINICAL DECISION MAKING: Evolving/moderate complexity  EVALUATION COMPLEXITY: Moderate   GOALS: Goals reviewed with patient? Yes  SHORT TERM GOALS: Target date: 11/28/22  Pt will be ind with toileting techniques Baseline: Goal status: MET  2.  Pt will be ind with initial HEP Baseline:  Goal status: MET  3.  Pt will be able to isolate external anal sphincter Baseline: working with biofeedback today Goal status: IN PROGRESS   LONG TERM GOALS: Target date: 05/25/23 Updated 02/13/23  Pt will be independent with advanced HEP to maintain improvements made throughout therapy  Baseline:  Goal status: IN PROGRESS  2.  Pt will report 75% reduction of pain due to improvements in posture, strength, and muscle length  Baseline: 40% less overall Goal status: IN PROGRESS  3.  Pt will have 75% reduced leakage during a typical week.  Baseline: no leakage Goal status: MET  4.  Pt  will have more solid stool for improved emptying Baseline:  Goal status: IN PROGRESS  5.  Pt will have neck pain reduced to 2/10 at most during typical day Baseline: 8/10 Goal status: IN PROGRESS     PLAN:  PT FREQUENCY: 1x/week  PT DURATION: 12 weeks  PLANNED INTERVENTIONS: Therapeutic exercises, Therapeutic activity, Neuromuscular re-education, Balance training, Gait training, Patient/Family education, Self Care, Joint mobilization, Dry Needling, Electrical stimulation, Cryotherapy, Moist heat, scar mobilization, Taping, Traction, Ultrasound, Biofeedback, Manual therapy, and Re-evaluation  PLAN FOR NEXT SESSION: f/u on cervical and dry needling #4 cervical; dry needling #6 gluteals, QL and lumbar if trigger points; hip strength - modified hip hinging and working on left hip extension   [employer], PT 02/20/2023, 3:39 PM

## 2023-02-22 ENCOUNTER — Ambulatory Visit (INDEPENDENT_AMBULATORY_CARE_PROVIDER_SITE_OTHER): Payer: Medicare Other | Admitting: Podiatry

## 2023-02-22 DIAGNOSIS — B351 Tinea unguium: Secondary | ICD-10-CM | POA: Diagnosis not present

## 2023-02-22 DIAGNOSIS — M79675 Pain in left toe(s): Secondary | ICD-10-CM

## 2023-02-22 DIAGNOSIS — M79674 Pain in right toe(s): Secondary | ICD-10-CM

## 2023-02-22 NOTE — Progress Notes (Unsigned)
Subjective: Chief Complaint  Patient presents with   Nail Problem    Thick painful toenails, 3 month follow up     77 year old female with the above concerns.  She is nails are thickened elongated she not able to trim them herself. No open lesions. No other concerns.   Objective: AAO x3, NAD DP/PT pulses palpable bilaterally, CRT less than 3 seconds Nails are hypertrophic, dystrophic, brittle, discolored, elongated 10.  Incurvation of the nails without any signs of infection.  Most notably the right hallux toenail is the most thickened.  Tenderness nails 1-5 bilaterally.  No open lesions or pre-ulcerative lesions are identified today.   No pain with calf compression, swelling, warmth, erythema  Assessment: Ingrown toenail, symptomatic onychomycosis  Plan: -All treatment options discussed with the patient including all alternatives, risks, complications.  -Sharply debrided nails x10 without any complications or bleeding.  Monitor ingrown toenails.  If they become more symptomatic will need to proceed with partial nail avulsions. -Patient encouraged to call the office with any questions, concerns, change in symptoms.   Vivi Barrack DPM

## 2023-02-26 ENCOUNTER — Ambulatory Visit: Payer: Medicare Other | Admitting: Physical Therapy

## 2023-02-26 DIAGNOSIS — M6281 Muscle weakness (generalized): Secondary | ICD-10-CM

## 2023-02-26 DIAGNOSIS — R293 Abnormal posture: Secondary | ICD-10-CM

## 2023-02-26 DIAGNOSIS — R279 Unspecified lack of coordination: Secondary | ICD-10-CM

## 2023-02-26 NOTE — Therapy (Signed)
OUTPATIENT PHYSICAL THERAPY FEMALE PELVIC TREATMENT      Patient Name: Mariah Snyder MRN: [id number] DOB:22-Nov-1945, 77 y.o., female Today's Date: 02/26/2023  END OF SESSION:  PT End of Session - 02/26/23 1406     Visit Number 12    Date for PT Re-Evaluation 05/25/23    Authorization Type medicare    Progress Note Due on Visit 20    PT Start Time 1236    PT Stop Time 1314    PT Time Calculation (min) 38 min    Activity Tolerance Patient tolerated treatment well    Behavior During Therapy WFL for tasks assessed/performed                       Past Medical History:  Diagnosis Date   Anemia    Anxiety    Arthritis    Asthma    Fibromyalgia    Gallstones    GERD (gastroesophageal reflux disease)    Hyperlipidemia    Hypertension    Hypothyroidism    Kidney stones    Peptic ulcer    Rectal prolapse    Sleep apnea    Urinary tract infection    Past Surgical History:  Procedure Laterality Date   bunyionectomy Left    CHOLECYSTECTOMY     ELBOW ARTHROSCOPY Left    LAPAROSCOPIC ASSISTED VAGINAL HYSTERECTOMY     SHOULDER ARTHROSCOPY Right    TONSILLECTOMY     Patient Active Problem List   Diagnosis Date Noted   Rectal prolapse 09/14/2022   Constipation 09/14/2022   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/2021   Left hip pain 10/12/2021   High cholesterol 03/07/2021   Moderate persistent asthma without complication 11/01/2020   Bereavement 02/06/2019   Bilateral temporomandibular joint pain 02/18/2018   Right leg numbness 05/21/2017   Squamous blepharitis of upper and lower eyelids of both eyes 05/21/2017   Meibomian gland dysfunction (MGD) of upper and lower lids of both eyes 02/20/2017   Chronic left-sided low back pain with left-sided sciatica 12/31/2016   OAB (overactive bladder) 08/29/2015   Hallux limitus 01/24/2015   Balance problems 05/12/2013   Lichen sclerosus 05/12/2013   Healthcare maintenance 01/12/2013   Essential tremor 07/08/2012    Gastro-esophageal reflux disease with esophagitis 07/02/2011   Fibromyalgia 05/27/2011   Keratoconjunctivitis sicca (HCC) 05/10/2011   Senile nuclear sclerosis 05/10/2011   Tear film insufficiency 05/10/2011   Essential (primary) hypertension 02/26/2011   Degenerative spinal arthritis 08/28/2000   Hashimoto's thyroiditis 08/28/1980    PCP: Myrlene Broker, MD   REFERRING PROVIDER: Major, Holley Dexter, PA-C  REFERRING DIAG: R15.9 (ICD-10-CM) - Full incontinence of feces   THERAPY DIAG:  Muscle weakness (generalized)  Abnormal posture  Unspecified lack of coordination  Rationale for Evaluation and Treatment: Rehabilitation  ONSET DATE: since colonoscopy in April 2023  SUBJECTIVE:  SUBJECTIVE STATEMENT: Pt had soreness after last treatment but then felt better by that night.  States there were a few days that the tremors were not as bad either.  Did have one episode of leakage this week.  I had PT for this two years ago and it got better and had no leakage, then began again since colonoscopy.  Currently fecal incontinence of bowel movements, without awareness, which occurs about 3 times a week.  Has abdominal pain and urgency without passing a bowel movement.  On mirilax helps.  I got pneumonia from aspirating during anesthesia when I had the colonoscopy Fluid intake: Yes: not much water, I forget to drink    PAIN:  PAIN:  Are you having pain? Yes NPRS scale: 4/10 (8/10 standing) Pain location: left hip and neck Pain orientation: Lateral  PAIN TYPE: aching and sharp Pain description: intermittent  Aggravating factors: lying on it when sleeping and sitting a long time Relieving factors: up and moving taking pressure off    PRECAUTIONS: None  WEIGHT BEARING RESTRICTIONS: No  FALLS:   Has patient fallen in last 6 months? Yes. 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, bible study, takes care of granddaughter  PLOF: Independent  PATIENT GOALS: not have incontinence  PERTINENT HISTORY:  history of HTN, arthritis, asthma, chronic constipation, hypothyroidism (most recent TFT's wnl), sleep apnea, essential tremor, fibromyalgia, overactive bladder, hysterectomy, and cholecystectomy Sexual abuse: No  BOWEL MOVEMENT: Pain with bowel movement: Yes Type of bowel movement:Type (Bristol Stool Scale) soft or diarrhea, Frequency daily and then skip a couple days, and Strain Yes sometimes Fully empty rectum: No Leakage: Yes: 3/week Pads: Yes: sometimes Fiber supplement: No  URINATION: Pain with urination: No Fully empty bladder: No Stream: Strong Urgency: No Frequency: medication to control  Leakage:  none Pads: No  INTERCOURSE:   PREGNANCY: Vaginal deliveries 2 Tearing Yes: both   PROLAPSE: None   OBJECTIVE:   DIAGNOSTIC FINDINGS:    PATIENT SURVEYS:    PFIQ-7   COGNITION: Overall cognitive status: Within functional limits for tasks assessed     SENSATION: Light touch:  Proprioception:   MUSCLE LENGTH: Hamstrings: Right 80 deg; Left 80 deg   LUMBAR SPECIAL TESTS:    FUNCTIONAL TESTS:    GAIT:  Comments: slow due to tremors  POSTURE: rounded shoulders and increased thoracic kyphosis, left hip more anterior rotation in stand and leaning left  PELVIC ALIGNMENT: left elevated and rotating forward in standing,   LUMBARAROM/PROM:  A/PROM A/PROM  eval  Flexion   Extension   Right lateral flexion   Left lateral flexion   Right rotation   Left rotation    (Blank rows = not tested)  LOWER EXTREMITY ROM:  Passive ROM Right eval Left eval  Hip flexion 75% 90%  Hip extension     Hip abduction    Hip adduction    Hip internal rotation    Hip external rotation 75% 90%  Knee flexion    Knee extension    Ankle dorsiflexion    Ankle plantarflexion    Ankle inversion    Ankle eversion     (Blank rows = not tested)  LOWER EXTREMITY MMT:  MMT Right 01/22/23  Left 01/22/23   Hip flexion 5 4+  Hip extension 5 4+  Hip abduction 4+ 4  Hip adduction 4+ 4  Hip internal rotation  Hip external rotation    Knee flexion    Knee extension    Ankle dorsiflexion    Ankle plantarflexion    Ankle inversion    Ankle eversion     PALPATION:   General                  External Perineal Exam no anal wink present, hemorrhoids                             Internal Pelvic Floor co-contracting with gluteals and diminished external anal sphincter  Patient confirms identification and approves PT to assess internal pelvic floor and treatment Yes  PELVIC MMT:   MMT eval  Vaginal   Internal Anal Sphincter   External Anal Sphincter 1/5  Puborectalis 3/5  Diastasis Recti   (Blank rows = not tested)        TONE: low  PROLAPSE: No   Cervical Rotation Left: 26 deg then it hurts  TODAY'S TREATMENT:                                                                                                                              DATE: 02/26/23 Manual: SCM and scalenes Rt, bil masseter, Lt upper trap, bil lumbar and thoracic paraspinals Trigger Point Dry-Needling  Treatment instructions: Expect mild to moderate muscle soreness. S/S of pneumothorax if dry needled over a lung field, and to seek immediate medical attention should they occur. Patient verbalized understanding of these instructions and education.  Patient Consent Given: Yes Education handout provided: Previously provided Muscles treated: SCM and scalenes Rt, bil masseter, Lt upper trap, bil lumbar and thoracic paraspinals Electrical stimulation performed: No Parameters: N/A Treatment response/outcome: twitch  response and increased soft tissue length  02/20/23  Manual: SCM and scalenes Rt, bil masseter, Lt upper trap, left glute med and piriformis and hip flexor Trigger Point Dry-Needling  Treatment instructions: Expect mild to moderate muscle soreness. S/S of pneumothorax if dry needled over a lung field, and to seek immediate medical attention should they occur. Patient verbalized understanding of these instructions and education.  Patient Consent Given: Yes Education handout provided: Previously provided Muscles treated: SCM and scalenes Rt, bil masseter, Lt upper trap, left glute med and piriformis and hip flexor Electrical stimulation performed: No Parameters: N/A Treatment response/outcome: twitch response and increased soft tissue length    02/13/23  Manual: Cervical paraspinals, suboccipitals,  trigger point release with manual pressure and dry needling , lumbar paraspinals and QL bil Trigger Point Dry-Needling  Treatment instructions: Expect mild to moderate muscle soreness. S/S of pneumothorax if dry needled over a lung field, and to seek immediate medical attention should they occur. Patient verbalized understanding of these instructions and education.  Patient Consent Given: Yes Education handout provided: Previously provided Muscles treated: bil suboccipitals, cervical paraspinals, lumbar multifidi, bil QL Electrical stimulation performed: No Parameters: N/A Treatment response/outcome: twitches and increased  soft tissue length  Exercises: Hip machine - abduction and ext - 15lb and 15x bil  Vibration plate h/s stretch - 60 sec  PATIENT EDUCATION:  Education details: Access Code: [email or web address] and added qped rocking and circles Person educated: Patient Education method: Explanation Education comprehension: verbalized understanding  HOME EXERCISE PROGRAM: Access Code: [email or web address] URL: [email or web address] Date: 12/11/2022 Prepared by: Dwana Curd  Exercises - Supine Hip Internal and External Rotation  - 1 x daily - 7 x weekly - 1 sets - 10 reps - 5 sec hold - Supine Upper Trunk and Cervical Rotation with Opposite Lower Trunk Rotation (Mirrored)  - 1 x daily - 7 x weekly - 1 sets - 10 reps - 5 hold - Sidelying Mid Thoracic Rotation (Mirrored)  - 1 x daily - 7 x weekly - 3 sets - 10 reps - Supine Transversus Abdominis Bracing - Hands on Ground  - 1 x daily - 7 x weekly - 3 sets - 10 reps  Patient Education - Trigger Point Dry Needling  ASSESSMENT:  CLINICAL IMPRESSION: Pt had some progress since previous visit with reduced cervical pain. Continued to address increased soft tissue length for pain management and improved posture.  Pt had tension in low back as well and released with manual and dry needling to continue to work on posture and core.  Pt will benefit from skilled PT as she is expected to continue to make improvements.  OBJECTIVE IMPAIRMENTS: decreased activity tolerance, decreased coordination, decreased endurance, decreased ROM, decreased strength, impaired flexibility, impaired sensation, impaired tone, postural dysfunction, and pain.   ACTIVITY LIMITATIONS: continence and toileting  PARTICIPATION LIMITATIONS: community activity  PERSONAL FACTORS: 3+ comorbidities: history of HTN, arthritis, asthma, chronic constipation, hypothyroidism (most recent TFT's wnl), sleep apnea, essential tremor, fibromyalgia, overactive bladder, hysterectomy, and cholecystectomy  are also affecting patient's functional outcome.   REHAB POTENTIAL: Excellent  CLINICAL DECISION MAKING: Evolving/moderate complexity  EVALUATION COMPLEXITY: Moderate   GOALS: Goals reviewed with patient? Yes  SHORT TERM GOALS: Target date: 11/28/22  Pt will be ind with toileting techniques Baseline: Goal status: MET  2.  Pt will be ind with initial HEP Baseline:  Goal status: MET  3.  Pt will be able to isolate external anal  sphincter Baseline: working with biofeedback today Goal status: IN PROGRESS   LONG TERM GOALS: Target date: 05/25/23 Updated 02/13/23  Pt will be independent with advanced HEP to maintain improvements made throughout therapy  Baseline:  Goal status: IN PROGRESS  2.  Pt will report 75% reduction of pain due to improvements in posture, strength, and muscle length  Baseline: 40% less overall Goal status: IN PROGRESS  3.  Pt will have 75% reduced leakage during a typical week.  Baseline: no leakage Goal status: MET  4.  Pt will have more solid stool for improved emptying Baseline:  Goal status: IN PROGRESS  5.  Pt will have neck pain reduced to 2/10 at most during typical day Baseline: 8/10 Goal status: IN PROGRESS     PLAN:  PT FREQUENCY: 1x/week  PT DURATION: 12 weeks  PLANNED INTERVENTIONS: Therapeutic exercises, Therapeutic activity, Neuromuscular re-education, Balance training, Gait training, Patient/Family education, Self Care, Joint mobilization, Dry Needling, Electrical stimulation, Cryotherapy, Moist heat, scar mobilization, Taping, Traction, Ultrasound, Biofeedback, Manual therapy, and Re-evaluation  PLAN FOR NEXT SESSION: f/u on cervical and lumbar dry needling; modified hip hinging and working on left hip extension; overall posture and cervical strength   Brayton Caves Deavin Forst, PT 02/26/2023, 2:12 PM

## 2023-02-28 ENCOUNTER — Ambulatory Visit (INDEPENDENT_AMBULATORY_CARE_PROVIDER_SITE_OTHER): Payer: Medicare Other

## 2023-02-28 VITALS — Ht 64.0 in | Wt 152.0 lb

## 2023-02-28 DIAGNOSIS — Z0000 Encounter for general adult medical examination without abnormal findings: Secondary | ICD-10-CM

## 2023-02-28 DIAGNOSIS — E2839 Other primary ovarian failure: Secondary | ICD-10-CM

## 2023-02-28 DIAGNOSIS — Z13820 Encounter for screening for osteoporosis: Secondary | ICD-10-CM | POA: Diagnosis not present

## 2023-02-28 NOTE — Progress Notes (Signed)
Subjective:   Mariah Snyder is a 77 y.o. female who presents for Medicare Annual (Subsequent) preventive examination.  Review of Systems    Virtual Visit via Telephone Note  I connected with  Mariah Snyder on 02/28/23 at  3:00 PM EDT by telephone and verified that I am speaking with the correct person using two identifiers.  Location: Patient: Home Provider: Office Persons participating in the virtual visit: patient/Nurse Health Advisor   I discussed the limitations, risks, security and privacy concerns of performing an evaluation and management service by telephone and the availability of in person appointments. The patient expressed understanding and agreed to proceed.  Interactive audio and video telecommunications were attempted between this nurse and patient, however failed, due to patient having technical difficulties OR patient did not have access to video capability.  We continued and completed visit with audio only.  Some vital signs may be absent or patient reported.   Tillie Rung, LPN  Cardiac Risk Factors include: advanced age (>56men, >67 women);hypertension     Objective:    Today's Vitals   02/28/23 1506  Weight: 152 lb (68.9 kg)  Height: 5\' 4"  (1.626 m)   Body mass index is 26.09 kg/m.     02/28/2023    3:18 PM 11/13/2022    9:11 AM 10/31/2022   10:31 AM 02/14/2022    2:07 PM 01/13/2022    7:58 PM 10/17/2021   10:45 AM 04/10/2021   10:19 AM  Advanced Directives  Does Patient Have a Medical Advance Directive? Yes Yes Yes Yes No Yes Yes  Type of Estate agent of [place];Living will Healthcare Power of [place];Out of facility DNR (pink MOST or yellow form);Living will Healthcare Power of [place];Living will Living will;Healthcare Power of Attorney  Living will Healthcare Power of [place];Living will;Out of facility DNR (pink MOST or yellow form)  Does patient want to make changes to medical advance directive?   No - Patient  declined No - Patient declined     Copy of Healthcare Power of Attorney in Chart? No - copy requested   No - copy requested     Would patient like information on creating a medical advance directive?     No - Patient declined      Current Medications (verified) Outpatient Encounter Medications as of 02/28/2023  Medication Sig   acetaminophen (TYLENOL) 325 MG tablet Take 650 mg by mouth every 6 (six) hours as needed.   albuterol (PROVENTIL) (2.5 MG/3ML) 0.083% nebulizer solution Take 3 mLs (2.5 mg total) by nebulization every 6 (six) hours as needed for wheezing or shortness of breath.   amLODipine (NORVASC) 5 MG tablet Take 1 tablet (5 mg total) by mouth daily.   ASPIRIN 81 PO Take by mouth.   busPIRone (BUSPAR) 5 MG tablet Take 1 tablet (5 mg total) by mouth 2 (two) times daily. Take one tablet in the morning and 1 tablet at bedtime   Carboxymethylcell-Hypromellose (GENTEAL) 0.25-0.3 % GEL Apply to eye. Apply to each eye at bedtime   carboxymethylcellul-glycerin (REFRESH OPTIVE) 0.5-0.9 % ophthalmic solution Apply to eye.   Cholecalciferol (VITAMIN D3) 50 MCG (2000 UT) capsule Take 2,000 Units by mouth daily. Take one capsule daily   clobetasol ointment (TEMOVATE) 0.05 % Apply to affected area twice a week as directed   conjugated estrogens (PREMARIN) vaginal cream Place vaginally 2 (two) times a week. Apply 0.5g into vagina 2 x per week   fluconazole (DIFLUCAN) 150 MG tablet Take 1  tablet (150 mg total) by mouth every 3 (three) days.   fluticasone-salmeterol (ADVAIR HFA) 230-21 MCG/ACT inhaler INHALE 2 PUFFS BY MOUTH TWICE A DAY AND RINSE MOUTH AFTER USE   levothyroxine (SYNTHROID) 112 MCG tablet Take 1 tablet (112 mcg total) by mouth daily.   lisinopril (ZESTRIL) 5 MG tablet Take 1 tablet (5 mg total) by mouth daily.   mirabegron ER (MYRBETRIQ) 50 MG TB24 tablet Take 50 mg by mouth daily.   montelukast (SINGULAIR) 10 MG tablet Take 1 tablet (10 mg total) by mouth at bedtime.   Omega-3 Fatty  Acids (FISH OIL) 1000 MG CAPS Take 1 capsule by mouth daily.   omeprazole (PRILOSEC) 20 MG capsule Take 1 capsule (20 mg total) by mouth daily as needed.   pregabalin (LYRICA) 75 MG capsule Take 1 capsule (75 mg total) by mouth 3 (three) times daily.   primidone (MYSOLINE) 50 MG tablet Take 4 tablets (200 mg total) by mouth 2 (two) times daily.   propranolol (INDERAL) 20 MG tablet Take 2 tablets (40 mg total) by mouth at bedtime.   simvastatin (ZOCOR) 20 MG tablet Take 1 tablet (20 mg total) by mouth daily.   No facility-administered encounter medications on file as of 02/28/2023.    Allergies (verified) Doxycycline, Sulfa antibiotics, Grass extracts [gramineae pollens], and Molds & smuts   History: Past Medical History:  Diagnosis Date   Anemia    Anxiety    Arthritis    Asthma    Fibromyalgia    Gallstones    GERD (gastroesophageal reflux disease)    Hyperlipidemia    Hypertension    Hypothyroidism    Kidney stones    Peptic ulcer    Rectal prolapse    Sleep apnea    Urinary tract infection    Past Surgical History:  Procedure Laterality Date   bunyionectomy Left    CHOLECYSTECTOMY     ELBOW ARTHROSCOPY Left    LAPAROSCOPIC ASSISTED VAGINAL HYSTERECTOMY     SHOULDER ARTHROSCOPY Right    TONSILLECTOMY     Family History  Problem Relation Age of Onset   Asthma Mother    Tremor Mother    Aneurysm Father        AAA   Heart attack Brother        Died from Heart Attack at age 25   Heart disease Brother    Other Brother        Post Polio Syndrome   Heart disease Paternal Grandmother    Tremor Child    Colon cancer Neg Hx    Esophageal cancer Neg Hx    Pancreatic cancer Neg Hx    Colon polyps Neg Hx    Social History   Socioeconomic History   Marital status: Widowed    Spouse name: Not on file   Number of children: 2   Years of education: Not on file   Highest education level: Not on file  Occupational History   Occupation: retired    Comment: worked in  radiology  Tobacco Use   Smoking status: Former    Types: Cigarettes    Passive exposure: Past   Smokeless tobacco: Never  Building services engineer Use: Never used  Substance and Sexual Activity   Alcohol use: Yes    Comment: Has a beer or glass of wine occasionally   Drug use: Never   Sexual activity: Not Currently  Other Topics Concern   Not on file  Social History Narrative  Right Handed    Lives in a two story home   Social Determinants of Health   Financial Resource Strain: Low Risk  (02/28/2023)   Overall Financial Resource Strain (CARDIA)    Difficulty of Paying Living Expenses: Not hard at all  Food Insecurity: No Food Insecurity (02/28/2023)   Hunger Vital Sign    Worried About Running Out of Food in the Last Year: Never true    Ran Out of Food in the Last Year: Never true  Transportation Needs: No Transportation Needs (02/28/2023)   PRAPARE - Administrator, Civil Service (Medical): No    Lack of Transportation (Non-Medical): No  Physical Activity: Insufficiently Active (02/28/2023)   Exercise Vital Sign    Days of Exercise per Week: 3 days    Minutes of Exercise per Session: 20 min  Stress: No Stress Concern Present (02/28/2023)   [employer] of Occupational Health - Occupational Stress Questionnaire    Feeling of Stress : Not at all  Social Connections: Moderately Integrated (02/28/2023)   Social Connection and Isolation Panel [NHANES]    Frequency of Communication with Friends and Family: More than three times a week    Frequency of Social Gatherings with Friends and Family: More than three times a week    Attends Religious Services: More than 4 times per year    Active Member of [employer] or Organizations: Yes    Attends Banker Meetings: More than 4 times per year    Marital Status: Widowed    Tobacco Counseling Counseling given: Not Answered   Clinical Intake:  Pre-visit preparation completed: No  Pain : No/denies pain      BMI - recorded: 26.09 Nutritional Risks: None Diabetes: No  How often do you need to have someone help you when you read instructions, pamphlets, or other written materials from your doctor or pharmacy?: 1 - Never  Diabetic?  No  Interpreter Needed?: No  Information entered by :: Theresa Mulligan LPN   Activities of Daily Living    02/28/2023    3:12 PM  In your present state of health, do you have any difficulty performing the following activities:  Hearing? 0  Vision? 0  Difficulty concentrating or making decisions? 0  Walking or climbing stairs? 0  Dressing or bathing? 0  Doing errands, shopping? 0  Preparing Food and eating ? N  Using the Toilet? N  In the past six months, have you accidently leaked urine? Y  Comment Wears Breifs and pads. Followed by Medical attention  Do you have problems with loss of bowel control? Y  Comment Wears breifs and pads. Followed by medical attention  Managing your Medications? N  Managing your Finances? N  Housekeeping or managing your Housekeeping? N    Patient Care Team: Myrlene Broker, MD as PCP - General (Internal Medicine) Tat, Octaviano Batty, DO as Consulting Physician (Neurology) Kandace Blitz, MD (Ophthalmology) Irene Limbo, DDS as Referring Physician (Dentistry) Martina Sinner, MD as Consulting Physician (Pulmonary Disease)  Indicate any recent Medical Services you may have received from other than [facility] providers in the past year (date may be approximate).     Assessment:   This is a routine wellness examination for Mariah Snyder.  Hearing/Vision screen Hearing Screening - Comments:: Denies hearing difficulties   Vision Screening - Comments:: Wears rx glasses - up to date with routine eye exams with  Dr Dierdre Searles  Dietary issues and exercise activities discussed: Exercise limited by: None  identified   Goals Addressed               This Visit's Progress     Control my balance and walk (pt-stated)          Depression Screen    02/28/2023    3:11 PM 09/14/2022    3:18 PM 09/14/2022    3:17 PM 02/14/2022    2:15 PM 04/28/2021   10:06 AM  PHQ 2/9 Scores  PHQ - 2 Score 0 0 0 0 0  PHQ- 9 Score  0       Fall Risk    02/28/2023    3:15 PM 11/13/2022    9:11 AM 09/14/2022    3:17 PM 05/08/2022   10:42 AM 02/14/2022    2:08 PM  Fall Risk   Falls in the past year? 1 1 0 1 1  Number falls in past yr: 0 1 0 1 1  Injury with Fall? 1 1 0 0 1  Comment Neck and back injury, Followed by medical attention      Risk for fall due to : Impaired balance/gait    History of fall(s);Impaired balance/gait  Follow up Falls prevention discussed Falls evaluation completed Falls evaluation completed  Falls evaluation completed    FALL RISK PREVENTION PERTAINING TO THE HOME:  Any stairs in or around the home? Yes  If so, are there any without handrails? No  Home free of loose throw rugs in walkways, pet beds, electrical cords, etc? Yes  Adequate lighting in your home to reduce risk of falls? Yes   ASSISTIVE DEVICES UTILIZED TO PREVENT FALLS:  Life alert? Yes  Use of a cane, walker or w/c? No  Grab bars in the bathroom? No  Shower chair or bench in shower? No  Elevated toilet seat or a handicapped toilet? No   TIMED UP AND GO:  Was the test performed? No . Audio Visit   Cognitive Function:        02/28/2023    3:18 PM 02/14/2022    2:20 PM  6CIT Screen  What Year? 0 points 0 points  What month? 0 points 0 points  What time? 0 points 0 points  Count back from 20 0 points 0 points  Months in reverse 0 points 0 points  Repeat phrase 0 points 0 points  Total Score 0 points 0 points    Immunizations Immunization History  Administered Date(s) Administered   Fluad Quad(high Dose 65+) 06/23/2021   Influenza, High Dose Seasonal PF 06/08/2022   Influenza, Seasonal, Injecte, Preservative Fre 07/10/2006, 06/06/2009, 08/21/2010, 07/02/2011   Influenza,inj,Quad PF,6+ Mos 07/02/2016, 05/26/2018,  06/09/2019   Influenza-Unspecified 06/25/2014, 07/02/2016, 07/04/2017, 07/10/2021   Moderna Covid-19 Vaccine Bivalent Booster 25yrs & up 07/10/2021   Moderna SARS-COV2 Booster Vaccination 01/27/2021   Moderna Sars-Covid-2 Vaccination 10/31/2019, 11/28/2019, 07/26/2020   Pfizer Covid-19 Vaccine Bivalent Booster 91yrs & up 06/22/2022   Pneumococcal Conjugate-13 12/29/2013   Pneumococcal Polysaccharide-23 04/24/2010   Tdap 06/04/2018   Zoster Recombinat (Shingrix) 06/20/2017, 04/21/2018   Zoster, Live 01/13/2008    TDAP status: Up to date  Flu Vaccine status: Up to date  Pneumococcal vaccine status: Up to date  Covid-19 vaccine status: Completed vaccines  Qualifies for Shingles Vaccine? Yes   Zostavax completed Yes   Shingrix Completed?: Yes  Screening Tests Health Maintenance  Topic Date Due   DEXA SCAN  Never done   COVID-19 Vaccine (7 - 2023-24 season) 03/16/2023 (Originally 08/17/2022)   Hepatitis C Screening  02/28/2024 (Originally 03/06/1964)   INFLUENZA VACCINE  04/18/2023   Medicare Annual Wellness (AWV)  02/28/2024   DTaP/Tdap/Td (2 - Td or Tdap) 06/04/2028   Pneumonia Vaccine 63+ Years old  Completed   Zoster Vaccines- Shingrix  Completed   HPV VACCINES  Aged Out   Colonoscopy  Discontinued    Health Maintenance  Health Maintenance Due  Topic Date Due   DEXA SCAN  Never done    Colorectal cancer screening: No longer required.   Mammogram status: No longer required due to Age.  Bone Density status: Ordered 02/28/23. Pt provided with contact info and advised to call to schedule appt.  Lung Cancer Screening: (Low Dose CT Chest recommended if Age 64-80 years, 30 pack-year currently smoking OR have quit w/in 15years.) does not qualify  Additional Screening:  Hepatitis C Screening: does qualify; Completed Deferred  Vision Screening: Recommended annual ophthalmology exams for early detection of glaucoma and other disorders of the eye. Is the patient up to date  with their annual eye exam?  Yes  Who is the provider or what is the name of the office in which the patient attends annual eye exams? Dr Dierdre Searles If pt is not established with a provider, would they like to be referred to a provider to establish care? No .   Dental Screening: Recommended annual dental exams for proper oral hygiene  Community Resource Referral / Chronic Care Management:  CRR required this visit?  No   CCM required this visit?  No      Plan:     I have personally reviewed and noted the following in the patient's chart:   Medical and social history Use of alcohol, tobacco or illicit drugs  Current medications and supplements including opioid prescriptions. Patient is not currently taking opioid prescriptions. Functional ability and status Nutritional status Physical activity Advanced directives List of other physicians Hospitalizations, surgeries, and ER visits in previous 12 months Vitals Screenings to include cognitive, depression, and falls Referrals and appointments  In addition, I have reviewed and discussed with patient certain preventive protocols, quality metrics, and best practice recommendations. A written personalized care plan for preventive services as well as general preventive health recommendations were provided to patient.     Tillie Rung, LPN   1/61/0960   Nurse Notes: Patient due Hep-C Screening

## 2023-02-28 NOTE — Patient Instructions (Addendum)
Mariah Snyder , Thank you for taking time to come for your Medicare Wellness Visit. I appreciate your ongoing commitment to your health goals. Please review the following plan we discussed and let me know if I can assist you in the future.   These are the goals we discussed:  Goals       Control my balance and walk (pt-stated)        This is a list of the screening recommended for you and due dates:  Health Maintenance  Topic Date Due   DEXA scan (bone density measurement)  Never done   COVID-19 Vaccine (7 - 2023-24 season) 03/16/2023*   Hepatitis C Screening  02/28/2024*   Flu Shot  04/18/2023   Medicare Annual Wellness Visit  02/28/2024   DTaP/Tdap/Td vaccine (2 - Td or Tdap) 06/04/2028   Pneumonia Vaccine  Completed   Zoster (Shingles) Vaccine  Completed   HPV Vaccine  Aged Out   Colon Cancer Screening  Discontinued  *Topic was postponed. The date shown is not the original due date.    Advanced directives: Please bring a copy of your health care power of attorney and living will to the office to be added to your chart at your convenience.   Conditions/risks identified: None  Next appointment: Follow up in one year for your annual wellness visit     Preventive Care 65 Years and Older, Female Preventive care refers to lifestyle choices and visits with your health care provider that can promote health and wellness. What does preventive care include? A yearly physical exam. This is also called an annual well check. Dental exams once or twice a year. Routine eye exams. Ask your health care provider how often you should have your eyes checked. Personal lifestyle choices, including: Daily care of your teeth and gums. Regular physical activity. Eating a healthy diet. Avoiding tobacco and drug use. Limiting alcohol use. Practicing safe sex. Taking low-dose aspirin every day. Taking vitamin and mineral supplements as recommended by your health care provider. What happens during  an annual well check? The services and screenings done by your health care provider during your annual well check will depend on your age, overall health, lifestyle risk factors, and family history of disease. Counseling  Your health care provider may ask you questions about your: Alcohol use. Tobacco use. Drug use. Emotional well-being. Home and relationship well-being. Sexual activity. Eating habits. History of falls. Memory and ability to understand (cognition). Work and work Astronomer. Reproductive health. Screening  You may have the following tests or measurements: Height, weight, and BMI. Blood pressure. Lipid and cholesterol levels. These may be checked every 5 years, or more frequently if you are over 59 years old. Skin check. Lung cancer screening. You may have this screening every year starting at age 64 if you have a 30-pack-year history of smoking and currently smoke or have quit within the past 15 years. Fecal occult blood test (FOBT) of the stool. You may have this test every year starting at age 50. Flexible sigmoidoscopy or colonoscopy. You may have a sigmoidoscopy every 5 years or a colonoscopy every 10 years starting at age 36. Hepatitis C blood test. Hepatitis B blood test. Sexually transmitted disease (STD) testing. Diabetes screening. This is done by checking your blood sugar (glucose) after you have not eaten for a while (fasting). You may have this done every 1-3 years. Bone density scan. This is done to screen for osteoporosis. You may have this done starting at age  65. Mammogram. This may be done every 1-2 years. Talk to your health care provider about how often you should have regular mammograms. Talk with your health care provider about your test results, treatment options, and if necessary, the need for more tests. Vaccines  Your health care provider may recommend certain vaccines, such as: Influenza vaccine. This is recommended every year. Tetanus,  diphtheria, and acellular pertussis (Tdap, Td) vaccine. You may need a Td booster every 10 years. Zoster vaccine. You may need this after age 110. Pneumococcal 13-valent conjugate (PCV13) vaccine. One dose is recommended after age 66. Pneumococcal polysaccharide (PPSV23) vaccine. One dose is recommended after age 75. Talk to your health care provider about which screenings and vaccines you need and how often you need them. This information is not intended to replace advice given to you by your health care provider. Make sure you discuss any questions you have with your health care provider. Document Released: 09/30/2015 Document Revised: 05/23/2016 Document Reviewed: 07/05/2015 Elsevier Interactive Patient Education  2017 [employer].  Fall Prevention in the Home Falls can cause injuries. They can happen to people of all ages. There are many things you can do to make your home safe and to help prevent falls. What can I do on the outside of my home? Regularly fix the edges of walkways and driveways and fix any cracks. Remove anything that might make you trip as you walk through a door, such as a raised step or threshold. Trim any bushes or trees on the path to your home. Use bright outdoor lighting. Clear any walking paths of anything that might make someone trip, such as rocks or tools. Regularly check to see if handrails are loose or broken. Make sure that both sides of any steps have handrails. Any raised decks and porches should have guardrails on the edges. Have any leaves, snow, or ice cleared regularly. Use sand or salt on walking paths during winter. Clean up any spills in your garage right away. This includes oil or grease spills. What can I do in the bathroom? Use night lights. Install grab bars by the toilet and in the tub and shower. Do not use towel bars as grab bars. Use non-skid mats or decals in the tub or shower. If you need to sit down in the shower, use a plastic,  non-slip stool. Keep the floor dry. Clean up any water that spills on the floor as soon as it happens. Remove soap buildup in the tub or shower regularly. Attach bath mats securely with double-sided non-slip rug tape. Do not have throw rugs and other things on the floor that can make you trip. What can I do in the bedroom? Use night lights. Make sure that you have a light by your bed that is easy to reach. Do not use any sheets or blankets that are too big for your bed. They should not hang down onto the floor. Have a firm chair that has side arms. You can use this for support while you get dressed. Do not have throw rugs and other things on the floor that can make you trip. What can I do in the kitchen? Clean up any spills right away. Avoid walking on wet floors. Keep items that you use a lot in easy-to-reach places. If you need to reach something above you, use a strong step stool that has a grab bar. Keep electrical cords out of the way. Do not use floor polish or wax that makes floors slippery.  If you must use wax, use non-skid floor wax. Do not have throw rugs and other things on the floor that can make you trip. What can I do with my stairs? Do not leave any items on the stairs. Make sure that there are handrails on both sides of the stairs and use them. Fix handrails that are broken or loose. Make sure that handrails are as long as the stairways. Check any carpeting to make sure that it is firmly attached to the stairs. Fix any carpet that is loose or worn. Avoid having throw rugs at the top or bottom of the stairs. If you do have throw rugs, attach them to the floor with carpet tape. Make sure that you have a light switch at the top of the stairs and the bottom of the stairs. If you do not have them, ask someone to add them for you. What else can I do to help prevent falls? Wear shoes that: Do not have high heels. Have rubber bottoms. Are comfortable and fit you well. Are closed  at the toe. Do not wear sandals. If you use a stepladder: Make sure that it is fully opened. Do not climb a closed stepladder. Make sure that both sides of the stepladder are locked into place. Ask someone to hold it for you, if possible. Clearly mark and make sure that you can see: Any grab bars or handrails. First and last steps. Where the edge of each step is. Use tools that help you move around (mobility aids) if they are needed. These include: Canes. Walkers. Scooters. Crutches. Turn on the lights when you go into a dark area. Replace any light bulbs as soon as they burn out. Set up your furniture so you have a clear path. Avoid moving your furniture around. If any of your floors are uneven, fix them. If there are any pets around you, be aware of where they are. Review your medicines with your doctor. Some medicines can make you feel dizzy. This can increase your chance of falling. Ask your doctor what other things that you can do to help prevent falls. This information is not intended to replace advice given to you by your health care provider. Make sure you discuss any questions you have with your health care provider. Document Released: 06/30/2009 Document Revised: 02/09/2016 Document Reviewed: 10/08/2014 Elsevier Interactive Patient Education  2017 [employer].

## 2023-03-04 ENCOUNTER — Other Ambulatory Visit: Payer: Self-pay | Admitting: Internal Medicine

## 2023-03-06 ENCOUNTER — Encounter: Payer: Medicare Other | Admitting: Physical Therapy

## 2023-03-11 NOTE — Therapy (Unsigned)
OUTPATIENT PHYSICAL THERAPY FEMALE PELVIC TREATMENT      Patient Name: Mariah Snyder MRN: [id number] DOB:1946/06/23, 77 y.o., female Today's Date: 03/12/2023  END OF SESSION:  PT End of Session - 03/12/23 1231     Visit Number 13    Date for PT Re-Evaluation 05/25/23    Authorization Type medicare    PT Start Time 1231    PT Stop Time 1313    PT Time Calculation (min) 42 min    Activity Tolerance Patient tolerated treatment well    Behavior During Therapy WFL for tasks assessed/performed                        Past Medical History:  Diagnosis Date   Anemia    Anxiety    Arthritis    Asthma    Fibromyalgia    Gallstones    GERD (gastroesophageal reflux disease)    Hyperlipidemia    Hypertension    Hypothyroidism    Kidney stones    Peptic ulcer    Rectal prolapse    Sleep apnea    Urinary tract infection    Past Surgical History:  Procedure Laterality Date   bunyionectomy Left    CHOLECYSTECTOMY     ELBOW ARTHROSCOPY Left    LAPAROSCOPIC ASSISTED VAGINAL HYSTERECTOMY     SHOULDER ARTHROSCOPY Right    TONSILLECTOMY     Patient Active Problem List   Diagnosis Date Noted   Constipation 09/14/2022   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/2021   Left hip pain 10/12/2021   High cholesterol 03/07/2021   Moderate persistent asthma without complication 11/01/2020   Bereavement 02/06/2019   Bilateral temporomandibular joint pain 02/18/2018   Right leg numbness 05/21/2017   Squamous blepharitis of upper and lower eyelids of both eyes 05/21/2017   Meibomian gland dysfunction (MGD) of upper and lower lids of both eyes 02/20/2017   Chronic left-sided low back pain with left-sided sciatica 12/31/2016   OAB (overactive bladder) 08/29/2015   Hallux limitus 01/24/2015   Balance problems 05/12/2013   Lichen sclerosus 05/12/2013   Healthcare maintenance 01/12/2013   Essential tremor 07/08/2012   Gastro-esophageal reflux disease with esophagitis 07/02/2011    Fibromyalgia 05/27/2011   Keratoconjunctivitis sicca (HCC) 05/10/2011   Senile nuclear sclerosis 05/10/2011   Tear film insufficiency 05/10/2011   Essential (primary) hypertension 02/26/2011   Degenerative spinal arthritis 08/28/2000   Hashimoto's thyroiditis 08/28/1980    PCP: Myrlene Broker, MD   REFERRING PROVIDER: Major, Holley Dexter, PA-C  REFERRING DIAG: R15.9 (ICD-10-CM) - Full incontinence of feces   THERAPY DIAG:  Muscle weakness (generalized)  Abnormal posture  Unspecified lack of coordination  Rationale for Evaluation and Treatment: Rehabilitation  ONSET DATE: since colonoscopy in April 2023  SUBJECTIVE:  SUBJECTIVE STATEMENT: Pt has not had any leakage or diarrhea this week.  Pt had several days that she was able to sleep without the tremors after the last visit.  Still having pain in the neck on the left side with cervical rotation.  I had PT for this two years ago and it got better and had no leakage, then began again since colonoscopy.  Currently fecal incontinence of bowel movements, without awareness, which occurs about 3 times a week.  Has abdominal pain and urgency without passing a bowel movement.  On mirilax helps.  I got pneumonia from aspirating during anesthesia when I had the colonoscopy Fluid intake: Yes: not much water, I forget to drink    PAIN:  PAIN:  Are you having pain? No    PRECAUTIONS: None  WEIGHT BEARING RESTRICTIONS: No  FALLS:  Has patient fallen in last 6 months? Yes. 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, bible study, takes care of granddaughter  PLOF: Independent  PATIENT GOALS: not  have incontinence  PERTINENT HISTORY:  history of HTN, arthritis, asthma, chronic constipation, hypothyroidism (most recent TFT's wnl), sleep apnea, essential tremor, fibromyalgia, overactive bladder, hysterectomy, and cholecystectomy Sexual abuse: No  BOWEL MOVEMENT: Pain with bowel movement: Yes Type of bowel movement:Type (Bristol Stool Scale) soft or diarrhea, Frequency daily and then skip a couple days, and Strain Yes sometimes Fully empty rectum: No Leakage: Yes: 3/week Pads: Yes: sometimes Fiber supplement: No  URINATION: Pain with urination: No Fully empty bladder: No Stream: Strong Urgency: No Frequency: medication to control  Leakage:  none Pads: No  INTERCOURSE:   PREGNANCY: Vaginal deliveries 2 Tearing Yes: both   PROLAPSE: None   OBJECTIVE:   DIAGNOSTIC FINDINGS:    PATIENT SURVEYS:    PFIQ-7   COGNITION: Overall cognitive status: Within functional limits for tasks assessed     SENSATION: Light touch:  Proprioception:   MUSCLE LENGTH: Hamstrings: Right 80 deg; Left 80 deg   LUMBAR SPECIAL TESTS:    FUNCTIONAL TESTS:    GAIT:  Comments: slow due to tremors  POSTURE: rounded shoulders and increased thoracic kyphosis, left hip more anterior rotation in stand and leaning left  PELVIC ALIGNMENT: left elevated and rotating forward in standing,   LUMBARAROM/PROM:  A/PROM A/PROM  eval  Flexion   Extension   Right lateral flexion   Left lateral flexion   Right rotation   Left rotation    (Blank rows = not tested)  LOWER EXTREMITY ROM:  Passive ROM Right eval Left eval  Hip flexion 75% 90%  Hip extension    Hip abduction    Hip adduction    Hip internal rotation    Hip external rotation 75% 90%  Knee flexion    Knee extension    Ankle dorsiflexion    Ankle plantarflexion    Ankle inversion    Ankle eversion     (Blank rows = not tested)  LOWER EXTREMITY MMT:  MMT Right 01/22/23  Left 01/22/23   Hip flexion 5  4+  Hip extension 5 4+  Hip abduction 4+ 4  Hip adduction 4+ 4  Hip internal rotation    Hip external rotation    Knee flexion    Knee extension    Ankle dorsiflexion    Ankle plantarflexion    Ankle inversion    Ankle eversion  PALPATION:   General                  External Perineal Exam no anal wink present, hemorrhoids                             Internal Pelvic Floor co-contracting with gluteals and diminished external anal sphincter  Patient confirms identification and approves PT to assess internal pelvic floor and treatment Yes  PELVIC MMT:   MMT eval  Vaginal   Internal Anal Sphincter   External Anal Sphincter 1/5  Puborectalis 3/5  Diastasis Recti   (Blank rows = not tested)        TONE: low  PROLAPSE: No   Cervical Rotation Left: 26 deg then it hurts  TODAY'S TREATMENT:                                                                                                                              DATE: 03/12/23 Manual: SCM and scalenes Rt, bil masseter, Lt upper trap, bil lumbar and thoracic paraspinals, cervical traction Trigger Point Dry-Needling  Treatment instructions: Expect mild to moderate muscle soreness. S/S of pneumothorax if dry needled over a lung field, and to seek immediate medical attention should they occur. Patient verbalized understanding of these instructions and education.  Patient Consent Given: Yes Education handout provided: Previously provided Muscles treated: SCM and scalenes Rt, bil masseter, Lt upper trap, bil lumbar and thoracic, paraspinals, suboccipitals bil Electrical stimulation performed: No Parameters: N/A Treatment response/outcome: twitch response and increased soft tissue length  Exericse: Chin tuck Chin tuck with rotation can do with less intense pain that without chin tuck  02/26/23 Manual: SCM and scalenes Rt, bil masseter, Lt upper trap, bil lumbar and thoracic paraspinals Trigger Point Dry-Needling   Treatment instructions: Expect mild to moderate muscle soreness. S/S of pneumothorax if dry needled over a lung field, and to seek immediate medical attention should they occur. Patient verbalized understanding of these instructions and education.  Patient Consent Given: Yes Education handout provided: Previously provided Muscles treated: SCM and scalenes Rt, bil masseter, Lt upper trap, bil lumbar and thoracic paraspinals Electrical stimulation performed: No Parameters: N/A Treatment response/outcome: twitch response and increased soft tissue length      PATIENT EDUCATION:  Education details: Access Code: [email or web address]  Person educated: Patient Education method: Explanation Education comprehension: verbalized understanding  HOME EXERCISE PROGRAM: Access Code: [email or web address] URL: [email or web address] Date: 03/12/2023 Prepared by: Dwana Curd  Exercises - Supine Hip Internal and External Rotation  - 1 x daily - 7 x weekly - 1 sets - 10 reps - 5 sec hold - Supine Upper Trunk and Cervical Rotation with Opposite Lower Trunk Rotation (Mirrored)  - 1 x daily - 7 x weekly - 1 sets - 10 reps - 5 hold - Sidelying Mid Thoracic Rotation (Mirrored)  - 1 x daily - 7 x weekly - 3 sets -  10 reps - Supine Transversus Abdominis Bracing - Hands on Ground  - 1 x daily - 7 x weekly - 3 sets - 10 reps - Ball squeeze with Kegel  - 3 x daily - 7 x weekly - 1 sets - 10 reps - 3 sec hold - Hooklying Small March  - 1 x daily - 7 x weekly - 2 sets - 10 reps - Seated Figure 4 Piriformis Stretch  - 1 x daily - 7 x weekly - 1 sets - 3 reps - 30 hold - Seated Thoracic Flexion and Rotation with Arms Crossed  - 1 x daily - 7 x weekly - 1 sets - 10 reps - 5 sec hold - Seated Cervical Rotation AROM  - 3 x daily - 7 x weekly - 1 sets - 10 reps - 5 hold - Seated Cervical Retraction and Rotation  - 1 x daily - 7 x weekly - 3 sets - 10 reps  Patient Education - Trigger Point Dry  Needling  ASSESSMENT:  CLINICAL IMPRESSION: Pt had some progress since previous visit with reduced cervical pain. Continued to address increased soft tissue length for pain management and improved posture.  Pt had tension in low back as well and released with manual and dry needling to continue to work on posture and core.  Today's session focused on improved cervical mobility with cervical retractions.    Pt will benefit from skilled PT as she is expected to continue to make improvements.  OBJECTIVE IMPAIRMENTS: decreased activity tolerance, decreased coordination, decreased endurance, decreased ROM, decreased strength, impaired flexibility, impaired sensation, impaired tone, postural dysfunction, and pain.   ACTIVITY LIMITATIONS: continence and toileting  PARTICIPATION LIMITATIONS: community activity  PERSONAL FACTORS: 3+ comorbidities: history of HTN, arthritis, asthma, chronic constipation, hypothyroidism (most recent TFT's wnl), sleep apnea, essential tremor, fibromyalgia, overactive bladder, hysterectomy, and cholecystectomy  are also affecting patient's functional outcome.   REHAB POTENTIAL: Excellent  CLINICAL DECISION MAKING: Evolving/moderate complexity  EVALUATION COMPLEXITY: Moderate   GOALS: Goals reviewed with patient? Yes  SHORT TERM GOALS: Target date: 11/28/22  Pt will be ind with toileting techniques Baseline: Goal status: MET  2.  Pt will be ind with initial HEP Baseline:  Goal status: MET  3.  Pt will be able to isolate external anal sphincter Baseline: working with biofeedback today Goal status: IN PROGRESS   LONG TERM GOALS: Target date: 05/25/23 Updated 02/13/23  Pt will be independent with advanced HEP to maintain improvements made throughout therapy  Baseline:  Goal status: IN PROGRESS  2.  Pt will report 75% reduction of pain due to improvements in posture, strength, and muscle length  Baseline: 40% less overall Goal status: IN PROGRESS  3.   Pt will have 75% reduced leakage during a typical week.  Baseline: no leakage Goal status: MET  4.  Pt will have more solid stool for improved emptying Baseline:  Goal status: IN PROGRESS  5.  Pt will have neck pain reduced to 2/10 at most during typical day Baseline: 8/10 Goal status: IN PROGRESS     PLAN:  PT FREQUENCY: 1x/week  PT DURATION: 12 weeks  PLANNED INTERVENTIONS: Therapeutic exercises, Therapeutic activity, Neuromuscular re-education, Balance training, Gait training, Patient/Family education, Self Care, Joint mobilization, Dry Needling, Electrical stimulation, Cryotherapy, Moist heat, scar mobilization, Taping, Traction, Ultrasound, Biofeedback, Manual therapy, and Re-evaluation  PLAN FOR NEXT SESSION: f/u on cervical retraction and cont cervical and lumbar dry needling; modified hip hinging and working on left hip extension; overall  posture and cervical strength   Junious Silk, PT 03/12/2023, 12:31 PM

## 2023-03-12 ENCOUNTER — Encounter: Payer: Self-pay | Admitting: Physical Therapy

## 2023-03-12 ENCOUNTER — Ambulatory Visit: Payer: Medicare Other | Admitting: Physical Therapy

## 2023-03-12 DIAGNOSIS — R293 Abnormal posture: Secondary | ICD-10-CM

## 2023-03-12 DIAGNOSIS — R279 Unspecified lack of coordination: Secondary | ICD-10-CM

## 2023-03-12 DIAGNOSIS — M6281 Muscle weakness (generalized): Secondary | ICD-10-CM | POA: Diagnosis not present

## 2023-03-12 NOTE — Progress Notes (Signed)
Assessment/Plan:    1.  Tremor              -Discussed dystonic nature of her head tremor again and that she really would likely not get good control without Botox.  She was previously not interested in Botox but thinks she has changed her mind and really would like to pursue that.  Her head is turned to the right and muscles involved her left sternocleidomastoid, right splenius capitis.             -She has become more frustrated with leg and hand tremor, which certainly may be dystonic in nature as well, especially since it is not responding well to primidone/propranolol.  She and I discussed this today.  She has not found higher dosages of primidone to be more effective and has found she has more cognitive dulling.  -Discussed weighted gloves.             -For now we will continue propranolol, 20 mg, 2 tablets at bedtime             - She will decrease primidone, 50 mg, 2 tablets twice per day.             -not interested in dbs right now but may be interested in focused ultrasound in the future.             -Has had kidney stones so really is not a good candidate for topiramate.             -pt has been seen by Dr. Rulon Eisenmenger [facility]) and by Dr. Neita Carp (movement at [facility]).  She felt she did not have a good experience at [facility].   2.  Possible TIA, March, 2022             -Event was very short-lived (seconds) so it is questionable whether this really represented TIA.  Nonetheless, Dr. Rulon Eisenmenger at [facility] worked this up extensively.  MRI and MRA were negative for anything acute.  Zio patch was completed and was unremarkable.  Patient now on aspirin, 81 mg daily.   3.  Gait spasticity             -Patient has had a workup for this but because of more falls and more trouble walking we are going to reinitiate workup.  She has also had recent fall with hitting of head.  She had an MRI in 2020 of her cervical spine at [facility] and an MRI of her lumbar spine in 2023.  Symptoms started in about 2018.    -we will  do MRI brain and cervical.  Going to include thoracic today given spasticity with c/o back pain (mid) and bowel incontinence  4.  Hx of anxiety/adjustment d/o  -She would really like to limit her medication load.  Her BuSpar was added when her husband died 4 years ago.  She asks me about that today.  She asks if it helps or hurts tremor.  It really does not do either and I have no objection, from my standpoint, if it is discontinued.  I told her to discuss with her primary care physician, who is prescribing it.   Subjective:   Mariah Snyder was seen today in follow up for essential tremor.  My previous records were reviewed prior to todays visit.  Last visit, we slightly increased her primidone.  She also wanted to take all of her propranolol at night to see if it made  a difference in daytime fatigue.  Today, she reports that tremor is still an issue.  She is having jaw and head tremor that prevents sleeping well..  She has been to PT since our last visit and notes are reviewed.   She has had falls.  She has 3 falls in the last month.  "I close the refrigerator door and its enough to push me over."  No walker or cane.  Has been having dry needling for neck pain when she fell.  She states that this happened when she went outside to throw a plant out in the woods and threw the plant and it threw her back and she fell back and hit her head.  She has always had neck pain but this made it much worse.  She has some intermittent bowel issues (loss of bowel) but that will depend on foods and what she eats.    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  Prior cervical spine MRI 2015 at [facility]:  degen changes with ? Cord signal change at C5 MRI Lumbar 10/2021:  wnl  ALLERGIES:    Allergies  Allergen Reactions   Doxycycline     N/V, diarrhea   Sulfa Antibiotics Other (See Comments)    Causes headaches.     Grass Extracts [Gramineae Pollens] Itching and Other (See Comments)    Runny itchy eyes & nose.   Molds & Smuts Itching and Other (See Comments)    Runny itchy eyes & nose.    CURRENT MEDICATIONS:  Current Meds  Medication Sig   acetaminophen (TYLENOL) 325 MG tablet Take 650 mg by mouth every 6 (six) hours as needed.   albuterol (PROVENTIL) (2.5 MG/3ML) 0.083% nebulizer solution Take 3 mLs (2.5 mg total) by nebulization every 6 (six) hours as needed for wheezing or shortness of breath.   amLODipine (NORVASC) 5 MG tablet Take 1 tablet (5 mg total) by mouth daily.   ASPIRIN 81 PO Take by mouth.   busPIRone (BUSPAR) 5 MG tablet Take 1 tablet (5 mg total) by mouth 2 (two) times daily. Take one tablet in the morning and 1 tablet at bedtime   Carboxymethylcell-Hypromellose (GENTEAL) 0.25-0.3 % GEL Apply to eye. Apply to each eye at bedtime   carboxymethylcellul-glycerin (REFRESH OPTIVE) 0.5-0.9 % ophthalmic solution Apply to eye.   Cholecalciferol (VITAMIN D3) 50 MCG (2000 UT) capsule Take 2,000 Units by mouth daily. Take one capsule daily   clobetasol ointment (TEMOVATE) 0.05 % Apply to affected area twice a week as directed   conjugated estrogens (PREMARIN) vaginal cream Place vaginally 2 (two) times a week. Apply 0.5g into vagina 2 x per week   fluticasone-salmeterol (ADVAIR HFA) 230-21 MCG/ACT inhaler INHALE 2 PUFFS BY MOUTH TWICE A DAY AND RINSE MOUTH AFTER USE   levothyroxine (SYNTHROID) 112 MCG tablet Take 1 tablet (112 mcg total) by mouth daily.   lisinopril (ZESTRIL) 5 MG tablet Take 1 tablet (5 mg total) by mouth daily.   LYRICA 75 MG capsule TAKE ONE CAPSULE BY MOUTH THREE TIMES A DAY (Patient taking differently: Take 75 mg by mouth 2 (two) times daily.)   mirabegron ER (MYRBETRIQ) 50 MG TB24 tablet Take 50 mg by mouth daily.   montelukast (SINGULAIR)  10  MG tablet Take 1 tablet (10 mg total) by mouth at bedtime.   Omega-3 Fatty Acids (FISH OIL) 1000 MG CAPS Take 1 capsule by mouth daily.   omeprazole (PRILOSEC) 20 MG capsule Take 1 capsule (20 mg total) by mouth daily as needed.   primidone (MYSOLINE) 50 MG tablet Take 4 tablets (200 mg total) by mouth 2 (two) times daily.   propranolol (INDERAL) 20 MG tablet Take 2 tablets (40 mg total) by mouth at bedtime.   saccharomyces boulardii (FLORASTOR) 250 MG capsule Take 250 mg by mouth 2 (two) times daily.   simvastatin (ZOCOR) 20 MG tablet Take 1 tablet (20 mg total) by mouth daily.      Objective:    PHYSICAL EXAMINATION:    VITALS:   Vitals:   03/15/23 0809  BP: 116/72  Pulse: 71  SpO2: 96%  Weight: 157 lb 9.6 oz (71.5 kg)  Height: 5\' 4"  (1.626 m)    GEN:  The patient appears stated age and is in NAD. HEENT:  Normocephalic, atraumatic.  The mucous membranes are moist.  Cardiovascular: Regular rate rhythm Lungs: Clear to auscultation bilaterally Neck/HEME:  There are no carotid bruits bilaterally.  Head to the right with head tremor, complex.  Head tremor better when neck to the L (same as previous visits)  Neurological examination:  Orientation: The patient is alert and oriented x3. Cranial nerves: There is good facial symmetry. The speech is fluent and clear. Soft palate rises symmetrically and there is no tongue deviation. Hearing is intact to conversational tone. Sensation: Sensation is intact to light touch throughout.  Vibration intact bilateral ankle Motor: Strength is at least antigravity x4. DTR's: 2 at bilateral biceps, triceps, brachioradialis, patella.  No Hoffman's or Tromner's response.  No ankle clonus (same as previous)  Movement examination: Tone: There is normal tone in the UE/LE Abnormal movements: there is head tremor, as above.  There is minimal postural tremor, mild, RUE>LUE.  Coordination:  There is no decremation with RAM's Gait and Station: The  patient's gait is wide based and same as previous. I have reviewed and interpreted the following labs independently   Chemistry      Component Value Date/Time   NA 140 05/18/2022 1639   K 4.5 05/18/2022 1639   CL 106 05/18/2022 1639   CO2 29 05/18/2022 1639   BUN 16 05/18/2022 1639   CREATININE 0.89 05/18/2022 1639      Component Value Date/Time   CALCIUM 9.3 05/18/2022 1639   ALKPHOS 94 05/18/2022 1639   AST 15 05/18/2022 1639   ALT 13 05/18/2022 1639   BILITOT 0.2 05/18/2022 1639      Lab Results  Component Value Date   WBC 7.6 05/18/2022   HGB 12.9 05/18/2022   HCT 38.8 05/18/2022   MCV 87.0 05/18/2022   PLT 283.0 05/18/2022   Lab Results  Component Value Date   TSH 0.87 05/18/2022     Chemistry      Component Value Date/Time   NA 140 05/18/2022 1639   K 4.5 05/18/2022 1639   CL 106 05/18/2022 1639   CO2 29 05/18/2022 1639   BUN 16 05/18/2022 1639   CREATININE 0.89 05/18/2022 1639      Component Value Date/Time   CALCIUM 9.3 05/18/2022 1639   ALKPHOS 94 05/18/2022 1639   AST 15 05/18/2022 1639   ALT 13 05/18/2022 1639   BILITOT 0.2 05/18/2022 1639         Total time spent on today's visit  was 50 minutes, including both face-to-face time and nonface-to-face time.  Time included that spent on review of records (prior notes available to me/labs/imaging if pertinent), discussing treatment and goals, answering patient's questions and coordinating care.  Cc:  Myrlene Broker, MD

## 2023-03-15 ENCOUNTER — Encounter: Payer: Self-pay | Admitting: Neurology

## 2023-03-15 ENCOUNTER — Ambulatory Visit (INDEPENDENT_AMBULATORY_CARE_PROVIDER_SITE_OTHER): Payer: Medicare Other | Admitting: Neurology

## 2023-03-15 VITALS — BP 116/72 | HR 71 | Ht 64.0 in | Wt 157.6 lb

## 2023-03-15 DIAGNOSIS — R296 Repeated falls: Secondary | ICD-10-CM | POA: Diagnosis not present

## 2023-03-15 DIAGNOSIS — M542 Cervicalgia: Secondary | ICD-10-CM

## 2023-03-15 DIAGNOSIS — R261 Paralytic gait: Secondary | ICD-10-CM | POA: Diagnosis not present

## 2023-03-15 DIAGNOSIS — W19XXXD Unspecified fall, subsequent encounter: Secondary | ICD-10-CM

## 2023-03-15 DIAGNOSIS — R159 Full incontinence of feces: Secondary | ICD-10-CM

## 2023-03-15 DIAGNOSIS — R270 Ataxia, unspecified: Secondary | ICD-10-CM

## 2023-03-15 DIAGNOSIS — G8929 Other chronic pain: Secondary | ICD-10-CM

## 2023-03-15 DIAGNOSIS — G250 Essential tremor: Secondary | ICD-10-CM | POA: Diagnosis not present

## 2023-03-15 DIAGNOSIS — G243 Spasmodic torticollis: Secondary | ICD-10-CM

## 2023-03-15 MED ORDER — PROPRANOLOL HCL 20 MG PO TABS: 40.0000 mg | ORAL_TABLET | Freq: Every day | ORAL | 1 refills | Status: DC

## 2023-03-15 MED ORDER — PRIMIDONE 50 MG PO TABS: 100.0000 mg | ORAL_TABLET | Freq: Two times a day (BID) | ORAL | 1 refills | Status: DC

## 2023-03-15 NOTE — Patient Instructions (Addendum)
Decrease primidone to 50 mg, 2 tablets twice per day.  Let me know if you think that tremor gets worse with doing this  Try using weighted gloves for the hands.  You can get these off of [employer], called [employer]  A referral to [facility] has been placed for your MRI someone will contact you directly to schedule your appt. They are located at [address] [facility]. Please contact them directly by calling [phone] with any questions regarding your referral.

## 2023-03-18 ENCOUNTER — Other Ambulatory Visit: Payer: Self-pay

## 2023-03-18 DIAGNOSIS — R159 Full incontinence of feces: Secondary | ICD-10-CM

## 2023-03-18 MED ORDER — AMBULATORY NON FORMULARY MEDICATION: 0 refills | Status: DC

## 2023-03-18 NOTE — Therapy (Unsigned)
OUTPATIENT PHYSICAL THERAPY FEMALE PELVIC TREATMENT      Patient Name: Mariah Snyder MRN: [id number] DOB:11-Nov-1945, 77 y.o., female Today's Date: 03/19/2023  END OF SESSION:  PT End of Session - 03/19/23 1105     Visit Number 14    Date for PT Re-Evaluation 05/25/23    Authorization Type medicare    PT Start Time 1056    PT Stop Time 1145    PT Time Calculation (min) 49 min    Activity Tolerance Patient tolerated treatment well    Behavior During Therapy WFL for tasks assessed/performed                         Past Medical History:  Diagnosis Date   Anemia    Anxiety    Arthritis    Asthma    Fibromyalgia    Gallstones    GERD (gastroesophageal reflux disease)    Hyperlipidemia    Hypertension    Hypothyroidism    Kidney stones    Peptic ulcer    Rectal prolapse    Sleep apnea    Urinary tract infection    Past Surgical History:  Procedure Laterality Date   bunyionectomy Left    CHOLECYSTECTOMY     ELBOW ARTHROSCOPY Left    LAPAROSCOPIC ASSISTED VAGINAL HYSTERECTOMY     SHOULDER ARTHROSCOPY Right    TONSILLECTOMY     Patient Active Problem List   Diagnosis Date Noted   Constipation 09/14/2022   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/2021   Left hip pain 10/12/2021   High cholesterol 03/07/2021   Moderate persistent asthma without complication 11/01/2020   Bereavement 02/06/2019   Bilateral temporomandibular joint pain 02/18/2018   Right leg numbness 05/21/2017   Squamous blepharitis of upper and lower eyelids of both eyes 05/21/2017   Meibomian gland dysfunction (MGD) of upper and lower lids of both eyes 02/20/2017   Chronic left-sided low back pain with left-sided sciatica 12/31/2016   OAB (overactive bladder) 08/29/2015   Hallux limitus 01/24/2015   Balance problems 05/12/2013   Lichen sclerosus 05/12/2013   Healthcare maintenance 01/12/2013   Essential tremor 07/08/2012   Gastro-esophageal reflux disease with esophagitis 07/02/2011    Fibromyalgia 05/27/2011   Keratoconjunctivitis sicca (HCC) 05/10/2011   Senile nuclear sclerosis 05/10/2011   Tear film insufficiency 05/10/2011   Essential (primary) hypertension 02/26/2011   Degenerative spinal arthritis 08/28/2000   Hashimoto's thyroiditis 08/28/1980    PCP: Myrlene Broker, MD   REFERRING PROVIDER: Major, Holley Dexter, PA-C  REFERRING DIAG: R15.9 (ICD-10-CM) - Full incontinence of feces   THERAPY DIAG:  Muscle weakness (generalized)  Abnormal posture  Unspecified lack of coordination  Rationale for Evaluation and Treatment: Rehabilitation  ONSET DATE: since colonoscopy in April 2023  SUBJECTIVE:  SUBJECTIVE STATEMENT: Pt went to the neurology and got some new info and 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  Pt is still having bouts of diarrhea and leakage, doesn't feel the leakage when it happens  I had PT for this two years ago and it got better and had no leakage, then began again since colonoscopy.  Currently fecal incontinence of bowel movements, without awareness, which occurs about 3 times a week.  Has abdominal pain and urgency without passing a bowel movement.  On mirilax helps.  I got pneumonia from aspirating during anesthesia when I had the colonoscopy Fluid intake: Yes: not much water, I forget to drink    PAIN:  PAIN:  Are you having pain? No    PRECAUTIONS: None  WEIGHT BEARING RESTRICTIONS: No  FALLS:  Has patient fallen in last 6 months? Yes. 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, bible  study, takes care of granddaughter  PLOF: Independent  PATIENT GOALS: not have incontinence  PERTINENT HISTORY:  history of HTN, arthritis, asthma, chronic constipation, hypothyroidism (most recent TFT's wnl), sleep apnea, essential tremor, fibromyalgia, overactive bladder, hysterectomy, and cholecystectomy Sexual abuse: No  BOWEL MOVEMENT: Pain with bowel movement: Yes Type of bowel movement:Type (Bristol Stool Scale) soft or diarrhea, Frequency daily and then skip a couple days, and Strain Yes sometimes Fully empty rectum: No Leakage: Yes: 3/week Pads: Yes: sometimes Fiber supplement: No  URINATION: Pain with urination: No Fully empty bladder: No Stream: Strong Urgency: No Frequency: medication to control  Leakage:  none Pads: No  INTERCOURSE:   PREGNANCY: Vaginal deliveries 2 Tearing Yes: both   PROLAPSE: None   OBJECTIVE:   DIAGNOSTIC FINDINGS:    PATIENT SURVEYS:    PFIQ-7   COGNITION: Overall cognitive status: Within functional limits for tasks assessed     SENSATION: Light touch:  Proprioception:   MUSCLE LENGTH: Hamstrings: Right 80 deg; Left 80 deg   LUMBAR SPECIAL TESTS:    FUNCTIONAL TESTS:    GAIT:  Comments: slow due to tremors  POSTURE: rounded shoulders and increased thoracic kyphosis, left hip more anterior rotation in stand and leaning left  PELVIC ALIGNMENT: left elevated and rotating forward in standing,   LUMBARAROM/PROM:  A/PROM A/PROM  eval  Flexion   Extension   Right lateral flexion   Left lateral flexion   Right rotation   Left rotation    (Blank rows = not tested)  LOWER EXTREMITY ROM:  Passive ROM Right eval Left eval  Hip flexion 75% 90%  Hip extension    Hip abduction    Hip adduction    Hip internal rotation    Hip external rotation 75% 90%  Knee flexion    Knee extension    Ankle dorsiflexion    Ankle plantarflexion    Ankle inversion    Ankle eversion     (Blank rows = not  tested)  LOWER EXTREMITY MMT:  MMT Right 01/22/23  Left 01/22/23   Hip flexion 5 4+  Hip extension 5 4+  Hip abduction 4+ 4  Hip adduction 4+ 4  Hip internal rotation    Hip external rotation    Knee flexion    Knee extension    Ankle dorsiflexion    Ankle plantarflexion  Ankle inversion    Ankle eversion     PALPATION:   General                  External Perineal Exam no anal wink present, hemorrhoids                             Internal Pelvic Floor co-contracting with gluteals and diminished external anal sphincter  Patient confirms identification and approves PT to assess internal pelvic floor and treatment Yes  PELVIC MMT:   MMT eval  Vaginal   Internal Anal Sphincter   External Anal Sphincter 1/5  Puborectalis 3/5  Diastasis Recti   (Blank rows = not tested)        TONE: low  PROLAPSE: No   Cervical Rotation Left: 26 deg then it hurts (at assessment); 32 deg ( on 03/19/23)  TODAY'S TREATMENT:                                                                                                                              DATE:  03/19/23 NMRE: RUSI with transperineal view to look at puborectal angle changes with contract and bulge Perianally looking at contract and bulge with breathing in hook lying, adding block, varying LE and upper extremity positions; cues to let "balloon deflate" for not pushing belly out  Manual (not performed today) SCM and scalenes Rt, bil masseter, Lt upper trap, bil lumbar and thoracic paraspinals, cervical traction   03/12/23 Manual: SCM and scalenes Rt, bil masseter, Lt upper trap, bil lumbar and thoracic paraspinals, cervical traction Trigger Point Dry-Needling  Treatment instructions: Expect mild to moderate muscle soreness. S/S of pneumothorax if dry needled over a lung field, and to seek immediate medical attention should they occur. Patient verbalized understanding of these instructions and education.  Patient Consent  Given: Yes Education handout provided: Previously provided Muscles treated: SCM and scalenes Rt, bil masseter, Lt upper trap, bil lumbar and thoracic, paraspinals, suboccipitals bil Electrical stimulation performed: No Parameters: N/A Treatment response/outcome: twitch response and increased soft tissue length  Exericse: Chin tuck Chin tuck with rotation can do with less intense pain that without chin tuck  02/26/23 Manual: SCM and scalenes Rt, bil masseter, Lt upper trap, bil lumbar and thoracic paraspinals Trigger Point Dry-Needling  Treatment instructions: Expect mild to moderate muscle soreness. S/S of pneumothorax if dry needled over a lung field, and to seek immediate medical attention should they occur. Patient verbalized understanding of these instructions and education.  Patient Consent Given: Yes Education handout provided: Previously provided Muscles treated: SCM and scalenes Rt, bil masseter, Lt upper trap, bil lumbar and thoracic paraspinals Electrical stimulation performed: No Parameters: N/A Treatment response/outcome: twitch response and increased soft tissue length      PATIENT EDUCATION:  Education details: Access Code: [email or web address]  Person educated: Patient Education method: Explanation Education comprehension: verbalized understanding  HOME EXERCISE PROGRAM: Access Code:  [email or web address] URL: [email or web address] Date: 03/12/2023 Prepared by: Dwana Curd  Exercises - Supine Hip Internal and External Rotation  - 1 x daily - 7 x weekly - 1 sets - 10 reps - 5 sec hold - Supine Upper Trunk and Cervical Rotation with Opposite Lower Trunk Rotation (Mirrored)  - 1 x daily - 7 x weekly - 1 sets - 10 reps - 5 hold - Sidelying Mid Thoracic Rotation (Mirrored)  - 1 x daily - 7 x weekly - 3 sets - 10 reps - Supine Transversus Abdominis Bracing - Hands on Ground  - 1 x daily - 7 x weekly - 3 sets - 10 reps - Ball squeeze with Kegel  - 3 x daily - 7 x  weekly - 1 sets - 10 reps - 3 sec hold - Hooklying Small March  - 1 x daily - 7 x weekly - 2 sets - 10 reps - Seated Figure 4 Piriformis Stretch  - 1 x daily - 7 x weekly - 1 sets - 3 reps - 30 hold - Seated Thoracic Flexion and Rotation with Arms Crossed  - 1 x daily - 7 x weekly - 1 sets - 10 reps - 5 sec hold - Seated Cervical Rotation AROM  - 3 x daily - 7 x weekly - 1 sets - 10 reps - 5 hold - Seated Cervical Retraction and Rotation  - 1 x daily - 7 x weekly - 3 sets - 10 reps  Patient Education - Trigger Point Dry Needling  ASSESSMENT:  CLINICAL IMPRESSION: Pt had some set backs with leakage and did not feel as good as she had in the past this week with the same or more tremors.  Pt does have improved cervical ROM.  Used RUSI today for feedback with pelvic floor muscle contractions.  Pt is getting very little movement and will benefit from skilled PT to continue working on improved muscle coordination and strength of the pelvic floor.  Pt will benefit from skilled PT as she is expected to continue to make improvements.  OBJECTIVE IMPAIRMENTS: decreased activity tolerance, decreased coordination, decreased endurance, decreased ROM, decreased strength, impaired flexibility, impaired sensation, impaired tone, postural dysfunction, and pain.   ACTIVITY LIMITATIONS: continence and toileting  PARTICIPATION LIMITATIONS: community activity  PERSONAL FACTORS: 3+ comorbidities: history of HTN, arthritis, asthma, chronic constipation, hypothyroidism (most recent TFT's wnl), sleep apnea, essential tremor, fibromyalgia, overactive bladder, hysterectomy, and cholecystectomy  are also affecting patient's functional outcome.   REHAB POTENTIAL: Excellent  CLINICAL DECISION MAKING: Evolving/moderate complexity  EVALUATION COMPLEXITY: Moderate   GOALS: Goals reviewed with patient? Yes  SHORT TERM GOALS: Target date: 11/28/22  Pt will be ind with toileting techniques Baseline: Goal status:  MET  2.  Pt will be ind with initial HEP Baseline:  Goal status: MET  3.  Pt will be able to isolate external anal sphincter Baseline: working with biofeedback today Goal status: IN PROGRESS   LONG TERM GOALS: Target date: 05/25/23 Updated 03/19/23  Pt will be independent with advanced HEP to maintain improvements made throughout therapy  Baseline:  Goal status: IN PROGRESS  2.  Pt will report 75% reduction of pain due to improvements in posture, strength, and muscle length  Baseline: 40% less overall Goal status: IN PROGRESS  3.  Pt will have 75% reduced leakage during a typical week.  Baseline: no leakage Goal status: MET  4.  Pt will have more solid stool for improved emptying Baseline: still having diarrhea Goal status: IN  PROGRESS  5.  Pt will have neck pain reduced to 2/10 at most during typical day Baseline: 8/10 Goal status: IN PROGRESS     PLAN:  PT FREQUENCY: 1x/week  PT DURATION: 12 weeks  PLANNED INTERVENTIONS: Therapeutic exercises, Therapeutic activity, Neuromuscular re-education, Balance training, Gait training, Patient/Family education, Self Care, Joint mobilization, Dry Needling, Electrical stimulation, Cryotherapy, Moist heat, scar mobilization, Taping, Traction, Ultrasound, Biofeedback, Manual therapy, and Re-evaluation  PLAN FOR NEXT SESSION: f/u pelvic floor contract and relax and maybe tactile cues with internal if still having a hard time feeling the muscles (prone?), RUSI to see changes possibly, cont cervical and lumbar dry needling;  modified hip hinging and working on left hip extension; overall posture and cervical strength   Brayton Caves Alzina Golda, PT 03/19/2023, 11:41 AM

## 2023-03-18 NOTE — Telephone Encounter (Signed)
Called patient she had let me know that she was taking an probiotic and this was not the brand she was not sure at the time of the visit. I have spoken to her and changed it to align

## 2023-03-19 ENCOUNTER — Encounter: Payer: Self-pay | Admitting: Physical Therapy

## 2023-03-19 ENCOUNTER — Ambulatory Visit: Payer: Medicare Other | Attending: Physician Assistant | Admitting: Physical Therapy

## 2023-03-19 DIAGNOSIS — M6281 Muscle weakness (generalized): Secondary | ICD-10-CM | POA: Diagnosis present

## 2023-03-19 DIAGNOSIS — R279 Unspecified lack of coordination: Secondary | ICD-10-CM | POA: Insufficient documentation

## 2023-03-19 DIAGNOSIS — R293 Abnormal posture: Secondary | ICD-10-CM | POA: Diagnosis present

## 2023-03-25 NOTE — Therapy (Unsigned)
OUTPATIENT PHYSICAL THERAPY FEMALE PELVIC TREATMENT      Patient Name: Mariah Snyder MRN: [id number] DOB:1946/01/23, 77 y.o., female Today's Date: 03/26/2023  END OF SESSION:  PT End of Session - 03/26/23 1106     Visit Number 15    Date for PT Re-Evaluation 05/25/23    Authorization Type medicare    PT Start Time 1102    PT Stop Time 1138    PT Time Calculation (min) 36 min    Activity Tolerance Patient tolerated treatment well    Behavior During Therapy WFL for tasks assessed/performed                          Past Medical History:  Diagnosis Date   Anemia    Anxiety    Arthritis    Asthma    Fibromyalgia    Gallstones    GERD (gastroesophageal reflux disease)    Hyperlipidemia    Hypertension    Hypothyroidism    Kidney stones    Peptic ulcer    Rectal prolapse    Sleep apnea    Urinary tract infection    Past Surgical History:  Procedure Laterality Date   bunyionectomy Left    CHOLECYSTECTOMY     ELBOW ARTHROSCOPY Left    LAPAROSCOPIC ASSISTED VAGINAL HYSTERECTOMY     SHOULDER ARTHROSCOPY Right    TONSILLECTOMY     Patient Active Problem List   Diagnosis Date Noted   Constipation 09/14/2022   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/2021   Left hip pain 10/12/2021   High cholesterol 03/07/2021   Moderate persistent asthma without complication 11/01/2020   Bereavement 02/06/2019   Bilateral temporomandibular joint pain 02/18/2018   Right leg numbness 05/21/2017   Squamous blepharitis of upper and lower eyelids of both eyes 05/21/2017   Meibomian gland dysfunction (MGD) of upper and lower lids of both eyes 02/20/2017   Chronic left-sided low back pain with left-sided sciatica 12/31/2016   OAB (overactive bladder) 08/29/2015   Hallux limitus 01/24/2015   Balance problems 05/12/2013   Lichen sclerosus 05/12/2013   Healthcare maintenance 01/12/2013   Essential tremor 07/08/2012   Gastro-esophageal reflux disease with esophagitis 07/02/2011    Fibromyalgia 05/27/2011   Keratoconjunctivitis sicca (HCC) 05/10/2011   Senile nuclear sclerosis 05/10/2011   Tear film insufficiency 05/10/2011   Essential (primary) hypertension 02/26/2011   Degenerative spinal arthritis 08/28/2000   Hashimoto's thyroiditis 08/28/1980    PCP: Myrlene Broker, MD   REFERRING PROVIDER: Major, Holley Dexter, PA-C  REFERRING DIAG: R15.9 (ICD-10-CM) - Full incontinence of feces   THERAPY DIAG:  Muscle weakness (generalized)  Abnormal posture  Unspecified lack of coordination  Rationale for Evaluation and Treatment: Rehabilitation  ONSET DATE: since colonoscopy in April 2023  SUBJECTIVE:  SUBJECTIVE STATEMENT: Pt still having pain on the left side of the neck.  [place] and felt very good afterwards.  Pt working on feeling the anal sphincter contractions and was able to do with tactile cues at home  I had PT for this two years ago and it got better and had no leakage, then began again since colonoscopy.  Currently fecal incontinence of bowel movements, without awareness, which occurs about 3 times a week.  Has abdominal pain and urgency without passing a bowel movement.  On mirilax helps.  I got pneumonia from aspirating during anesthesia when I had the colonoscopy Fluid intake: Yes: not much water, I forget to drink    PAIN:  PAIN:  Are you having pain? No    PRECAUTIONS: None  WEIGHT BEARING RESTRICTIONS: No  FALLS:  Has patient fallen in last 6 months? Yes. 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, bible study, takes care of granddaughter  PLOF: Independent  PATIENT GOALS: not  have incontinence  PERTINENT HISTORY:  history of HTN, arthritis, asthma, chronic constipation, hypothyroidism (most recent TFT's wnl), sleep apnea, essential tremor, fibromyalgia, overactive bladder, hysterectomy, and cholecystectomy Sexual abuse: No  BOWEL MOVEMENT: Pain with bowel movement: Yes Type of bowel movement:Type (Bristol Stool Scale) soft or diarrhea, Frequency daily and then skip a couple days, and Strain Yes sometimes Fully empty rectum: No Leakage: Yes: 3/week Pads: Yes: sometimes Fiber supplement: No  URINATION: Pain with urination: No Fully empty bladder: No Stream: Strong Urgency: No Frequency: medication to control  Leakage:  none Pads: No  INTERCOURSE:   PREGNANCY: Vaginal deliveries 2 Tearing Yes: both   PROLAPSE: None   OBJECTIVE:   DIAGNOSTIC FINDINGS:    PATIENT SURVEYS:    PFIQ-7   COGNITION: Overall cognitive status: Within functional limits for tasks assessed     SENSATION: Light touch:  Proprioception:   MUSCLE LENGTH: Hamstrings: Right 80 deg; Left 80 deg   LUMBAR SPECIAL TESTS:    FUNCTIONAL TESTS:    GAIT:  Comments: slow due to tremors  POSTURE: rounded shoulders and increased thoracic kyphosis, left hip more anterior rotation in stand and leaning left  PELVIC ALIGNMENT: left elevated and rotating forward in standing,   LUMBARAROM/PROM:  A/PROM A/PROM  eval  Flexion   Extension   Right lateral flexion   Left lateral flexion   Right rotation   Left rotation    (Blank rows = not tested)  LOWER EXTREMITY ROM:  Passive ROM Right eval Left eval  Hip flexion 75% 90%  Hip extension    Hip abduction    Hip adduction    Hip internal rotation    Hip external rotation 75% 90%  Knee flexion    Knee extension    Ankle dorsiflexion    Ankle plantarflexion    Ankle inversion    Ankle eversion     (Blank rows = not tested)  LOWER EXTREMITY MMT:  MMT Right 01/22/23  Left 01/22/23   Hip flexion 5  4+  Hip extension 5 4+  Hip abduction 4+ 4  Hip adduction 4+ 4  Hip internal rotation    Hip external rotation    Knee flexion    Knee extension    Ankle dorsiflexion    Ankle plantarflexion    Ankle inversion    Ankle eversion  PALPATION:   General                  External Perineal Exam no anal wink present, hemorrhoids                             Internal Pelvic Floor co-contracting with gluteals and diminished external anal sphincter  Patient confirms identification and approves PT to assess internal pelvic floor and treatment Yes  PELVIC MMT:   MMT eval  Vaginal   Internal Anal Sphincter   External Anal Sphincter 1/5  Puborectalis 3/5  Diastasis Recti   (Blank rows = not tested)        TONE: low  PROLAPSE: No   Cervical Rotation Left: 26 deg then it hurts (at assessment); 32 deg ( on 03/19/23)  TODAY'S TREATMENT:                                                                                                                              DATE:  03/25/23  Manual  SCM and scalenes Rt, Lt upper trap, bil lumbar and thoracic paraspinals, cervical paraspinals, bil gluteals  Trigger Point Dry-Needling  Treatment instructions: Expect mild to moderate muscle soreness. S/S of pneumothorax if dry needled over a lung field, and to seek immediate medical attention should they occur. Patient verbalized understanding of these instructions and education.  Patient Consent Given: Yes Education handout provided: Previously provided Muscles treated: Lt upper trap, bil lumbar and thoracic paraspinals, cervical paraspinals, bil gluteals Electrical stimulation performed: No Parameters: N/A Treatment response/outcome: increased soft tissue length   03/19/23 NMRE: RUSI with transperineal view to look at puborectal angle changes with contract and bulge Perianally looking at contract and bulge with breathing in hook lying, adding block, varying LE and upper extremity positions;  cues to let "balloon deflate" for not pushing belly out  Manual (not performed today) SCM and scalenes Rt, bil masseter, Lt upper trap, bil lumbar and thoracic paraspinals, cervical traction   03/12/23 Manual: SCM and scalenes Rt, bil masseter, Lt upper trap, bil lumbar and thoracic paraspinals, cervical traction Trigger Point Dry-Needling  Treatment instructions: Expect mild to moderate muscle soreness. S/S of pneumothorax if dry needled over a lung field, and to seek immediate medical attention should they occur. Patient verbalized understanding of these instructions and education.  Patient Consent Given: Yes Education handout provided: Previously provided Muscles treated: SCM and scalenes Rt, bil masseter, Lt upper trap, bil lumbar and thoracic, paraspinals, suboccipitals bil Electrical stimulation performed: No Parameters: N/A Treatment response/outcome: twitch response and increased soft tissue length  Exericse: Chin tuck Chin tuck with rotation can do with less intense pain that without chin tuck  02/26/23 Manual: SCM and scalenes Rt, bil masseter, Lt upper trap, bil lumbar and thoracic paraspinals Trigger Point Dry-Needling  Treatment instructions: Expect mild to moderate muscle soreness. S/S of pneumothorax if dry needled over a lung field, and  to seek immediate medical attention should they occur. Patient verbalized understanding of these instructions and education.  Patient Consent Given: Yes Education handout provided: Previously provided Muscles treated: SCM and scalenes Rt, bil masseter, Lt upper trap, bil lumbar and thoracic paraspinals Electrical stimulation performed: No Parameters: N/A Treatment response/outcome: twitch response and increased soft tissue length      PATIENT EDUCATION:  Education details: Access Code: [email or web address]  Person educated: Patient Education method: Explanation Education comprehension: verbalized understanding  HOME EXERCISE  PROGRAM: Access Code: [email or web address] URL: [email or web address] Date: 03/12/2023 Prepared by: Dwana Curd  Exercises - Supine Hip Internal and External Rotation  - 1 x daily - 7 x weekly - 1 sets - 10 reps - 5 sec hold - Supine Upper Trunk and Cervical Rotation with Opposite Lower Trunk Rotation (Mirrored)  - 1 x daily - 7 x weekly - 1 sets - 10 reps - 5 hold - Sidelying Mid Thoracic Rotation (Mirrored)  - 1 x daily - 7 x weekly - 3 sets - 10 reps - Supine Transversus Abdominis Bracing - Hands on Ground  - 1 x daily - 7 x weekly - 3 sets - 10 reps - Ball squeeze with Kegel  - 3 x daily - 7 x weekly - 1 sets - 10 reps - 3 sec hold - Hooklying Small March  - 1 x daily - 7 x weekly - 2 sets - 10 reps - Seated Figure 4 Piriformis Stretch  - 1 x daily - 7 x weekly - 1 sets - 3 reps - 30 hold - Seated Thoracic Flexion and Rotation with Arms Crossed  - 1 x daily - 7 x weekly - 1 sets - 10 reps - 5 sec hold - Seated Cervical Rotation AROM  - 3 x daily - 7 x weekly - 1 sets - 10 reps - 5 hold - Seated Cervical Retraction and Rotation  - 1 x daily - 7 x weekly - 3 sets - 10 reps  Patient Education - Trigger Point Dry Needling  ASSESSMENT:  CLINICAL IMPRESSION: Pt did well with manual techniques and had palpable increased soft tissue length and pliability.  Discussed pt exercises including continue to use tactile feedback to work on contracting pelvic floor sphincter muscles.  Pt will benefit from one more dry needling to ensure maximum benefit from manual treatments and to follow up with home exercises.   Pt will benefit from skilled PT as she is expected to continue to make improvements.  OBJECTIVE IMPAIRMENTS: decreased activity tolerance, decreased coordination, decreased endurance, decreased ROM, decreased strength, impaired flexibility, impaired sensation, impaired tone, postural dysfunction, and pain.   ACTIVITY LIMITATIONS: continence and toileting  PARTICIPATION  LIMITATIONS: community activity  PERSONAL FACTORS: 3+ comorbidities: history of HTN, arthritis, asthma, chronic constipation, hypothyroidism (most recent TFT's wnl), sleep apnea, essential tremor, fibromyalgia, overactive bladder, hysterectomy, and cholecystectomy  are also affecting patient's functional outcome.   REHAB POTENTIAL: Excellent  CLINICAL DECISION MAKING: Evolving/moderate complexity  EVALUATION COMPLEXITY: Moderate   GOALS: Goals reviewed with patient? Yes  SHORT TERM GOALS: Target date: 11/28/22  Pt will be ind with toileting techniques Baseline: Goal status: MET  2.  Pt will be ind with initial HEP Baseline:  Goal status: MET  3.  Pt will be able to isolate external anal sphincter Baseline: working with biofeedback today Goal status: IN PROGRESS   LONG TERM GOALS: Target date: 05/25/23 Updated 03/19/23  Pt will be independent with advanced HEP to maintain improvements made throughout therapy  Baseline:  Goal status: IN PROGRESS  2.  Pt will report 75% reduction of pain due to improvements in posture, strength, and muscle length  Baseline: 40% less overall Goal status: IN PROGRESS  3.  Pt will have 75% reduced leakage during a typical week.  Baseline: no leakage Goal status: MET  4.  Pt will have more solid stool for improved emptying Baseline: still having diarrhea Goal status: IN PROGRESS  5.  Pt will have neck pain reduced to 2/10 at most during typical day Baseline: 8/10 Goal status: IN PROGRESS     PLAN:  PT FREQUENCY: 1x/week  PT DURATION: 12 weeks  PLANNED INTERVENTIONS: Therapeutic exercises, Therapeutic activity, Neuromuscular re-education, Balance training, Gait training, Patient/Family education, Self Care, Joint mobilization, Dry Needling, Electrical stimulation, Cryotherapy, Moist heat, scar mobilization, Taping, Traction, Ultrasound, Biofeedback, Manual therapy, and Re-evaluation  PLAN FOR NEXT SESSION: cont cervical and lumbar  dry needling; final HEP and d/c likely next  modified hip hinging and working on left hip extension; overall posture and cervical strength   Brayton Caves Yarelly Kuba, PT 03/26/2023, 12:34 PM

## 2023-03-26 ENCOUNTER — Ambulatory Visit: Payer: Medicare Other | Admitting: Physical Therapy

## 2023-03-26 DIAGNOSIS — R293 Abnormal posture: Secondary | ICD-10-CM

## 2023-03-26 DIAGNOSIS — M6281 Muscle weakness (generalized): Secondary | ICD-10-CM | POA: Diagnosis not present

## 2023-03-26 DIAGNOSIS — R279 Unspecified lack of coordination: Secondary | ICD-10-CM

## 2023-04-01 NOTE — Therapy (Signed)
OUTPATIENT PHYSICAL THERAPY FEMALE PELVIC TREATMENT      Patient Name: Mariah Snyder MRN: [id number] DOB:08/10/1946, 77 y.o., female Today's Date: 04/02/2023  END OF SESSION:  PT End of Session - 04/02/23 1107     Visit Number 16    Date for PT Re-Evaluation 05/25/23    Authorization Type medicare    Progress Note Due on Visit 20    PT Start Time 1105    PT Stop Time 1145    PT Time Calculation (min) 40 min    Activity Tolerance Patient tolerated treatment well    Behavior During Therapy WFL for tasks assessed/performed                           Past Medical History:  Diagnosis Date   Anemia    Anxiety    Arthritis    Asthma    Fibromyalgia    Gallstones    GERD (gastroesophageal reflux disease)    Hyperlipidemia    Hypertension    Hypothyroidism    Kidney stones    Peptic ulcer    Rectal prolapse    Sleep apnea    Urinary tract infection    Past Surgical History:  Procedure Laterality Date   bunyionectomy Left    CHOLECYSTECTOMY     ELBOW ARTHROSCOPY Left    LAPAROSCOPIC ASSISTED VAGINAL HYSTERECTOMY     SHOULDER ARTHROSCOPY Right    TONSILLECTOMY     Patient Active Problem List   Diagnosis Date Noted   Constipation 09/14/2022   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/2021   Left hip pain 10/12/2021   High cholesterol 03/07/2021   Moderate persistent asthma without complication 11/01/2020   Bereavement 02/06/2019   Bilateral temporomandibular joint pain 02/18/2018   Right leg numbness 05/21/2017   Squamous blepharitis of upper and lower eyelids of both eyes 05/21/2017   Meibomian gland dysfunction (MGD) of upper and lower lids of both eyes 02/20/2017   Chronic left-sided low back pain with left-sided sciatica 12/31/2016   OAB (overactive bladder) 08/29/2015   Hallux limitus 01/24/2015   Balance problems 05/12/2013   Lichen sclerosus 05/12/2013   Healthcare maintenance 01/12/2013   Essential tremor 07/08/2012   Gastro-esophageal reflux  disease with esophagitis 07/02/2011   Fibromyalgia 05/27/2011   Keratoconjunctivitis sicca (HCC) 05/10/2011   Senile nuclear sclerosis 05/10/2011   Tear film insufficiency 05/10/2011   Essential (primary) hypertension 02/26/2011   Degenerative spinal arthritis 08/28/2000   Hashimoto's thyroiditis 08/28/1980    PCP: Myrlene Broker, MD   REFERRING PROVIDER: Major, Holley Dexter, PA-C  REFERRING DIAG: R15.9 (ICD-10-CM) - Full incontinence of feces   THERAPY DIAG:  Muscle weakness (generalized)  Abnormal posture  Unspecified lack of coordination  Rationale for Evaluation and Treatment: Rehabilitation  ONSET DATE: since colonoscopy in April 2023  SUBJECTIVE:  SUBJECTIVE STATEMENT: Pt still having pain on the left side of the neck.  Pt able to get to the bathroom currently without leakage but food still causes diarrhea  I had PT for this two years ago and it got better and had no leakage, then began again since colonoscopy.  Currently fecal incontinence of bowel movements, without awareness, which occurs about 3 times a week.  Has abdominal pain and urgency without passing a bowel movement.  On mirilax helps.  I got pneumonia from aspirating during anesthesia when I had the colonoscopy Fluid intake: Yes: not much water, I forget to drink    PAIN:  PAIN:  Are you having pain? No    PRECAUTIONS: None  WEIGHT BEARING RESTRICTIONS: No  FALLS:  Has patient fallen in last 6 months? Yes. 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, bible study, takes care of granddaughter  PLOF: Independent  PATIENT GOALS: not have incontinence  PERTINENT  HISTORY:  history of HTN, arthritis, asthma, chronic constipation, hypothyroidism (most recent TFT's wnl), sleep apnea, essential tremor, fibromyalgia, overactive bladder, hysterectomy, and cholecystectomy Sexual abuse: No  BOWEL MOVEMENT: Pain with bowel movement: Yes Type of bowel movement:Type (Bristol Stool Scale) soft or diarrhea, Frequency daily and then skip a couple days, and Strain Yes sometimes Fully empty rectum: No Leakage: Yes: 3/week Pads: Yes: sometimes Fiber supplement: No  URINATION: Pain with urination: No Fully empty bladder: No Stream: Strong Urgency: No Frequency: medication to control  Leakage:  none Pads: No  INTERCOURSE:   PREGNANCY: Vaginal deliveries 2 Tearing Yes: both   PROLAPSE: None   OBJECTIVE:   DIAGNOSTIC FINDINGS:    PATIENT SURVEYS:    PFIQ-7   COGNITION: Overall cognitive status: Within functional limits for tasks assessed     SENSATION: Light touch:  Proprioception:   MUSCLE LENGTH: Hamstrings: Right 80 deg; Left 80 deg   LUMBAR SPECIAL TESTS:    FUNCTIONAL TESTS:    GAIT:  Comments: slow due to tremors  POSTURE: rounded shoulders and increased thoracic kyphosis, left hip more anterior rotation in stand and leaning left  PELVIC ALIGNMENT: left elevated and rotating forward in standing,   LUMBARAROM/PROM:  A/PROM A/PROM  eval  Flexion   Extension   Right lateral flexion   Left lateral flexion   Right rotation   Left rotation    (Blank rows = not tested)  LOWER EXTREMITY ROM:  Passive ROM Right eval Left eval  Hip flexion 75% 90%  Hip extension    Hip abduction    Hip adduction    Hip internal rotation    Hip external rotation 75% 90%  Knee flexion    Knee extension    Ankle dorsiflexion    Ankle plantarflexion    Ankle inversion    Ankle eversion     (Blank rows = not tested)  LOWER EXTREMITY MMT:  MMT Right 01/22/23  Left 01/22/23   Hip flexion 5 4+  Hip extension 5 4+  Hip  abduction 4+ 4  Hip adduction 4+ 4  Hip internal rotation    Hip external rotation    Knee flexion    Knee extension    Ankle dorsiflexion    Ankle plantarflexion    Ankle inversion    Ankle eversion     PALPATION:   General  External Perineal Exam no anal wink present, hemorrhoids                             Internal Pelvic Floor co-contracting with gluteals and diminished external anal sphincter  Patient confirms identification and approves PT to assess internal pelvic floor and treatment Yes  PELVIC MMT:   MMT eval  Vaginal   Internal Anal Sphincter   External Anal Sphincter 1/5  Puborectalis 3/5  Diastasis Recti   (Blank rows = not tested)        TONE: low  PROLAPSE: No   Cervical Rotation Left: 26 deg then it hurts (at assessment); 32 deg ( on 03/19/23)  TODAY'S TREATMENT:                                                                                                                              DATE:  04/02/23  Manual  Bilateral lumbar paraspinals and glute med  Trigger Point Dry-Needling  Treatment instructions: Expect mild to moderate muscle soreness. S/S of pneumothorax if dry needled over a lung field, and to seek immediate medical attention should they occur. Patient verbalized understanding of these instructions and education.  Patient Consent Given: Yes Education handout provided: Previously provided Muscles treated: lumbar multifidi and paraspinals, bil gluteals Electrical stimulation performed: No Parameters: N/A Treatment response/outcome: increased soft tissue length  03/25/23  Manual  SCM and scalenes Rt, Lt upper trap, bil lumbar and thoracic paraspinals, cervical paraspinals, bil gluteals  Trigger Point Dry-Needling  Treatment instructions: Expect mild to moderate muscle soreness. S/S of pneumothorax if dry needled over a lung field, and to seek immediate medical attention should they occur. Patient verbalized  understanding of these instructions and education.  Patient Consent Given: Yes Education handout provided: Previously provided Muscles treated: Lt upper trap, bil lumbar and thoracic paraspinals, cervical paraspinals, bil gluteals Electrical stimulation performed: No Parameters: N/A Treatment response/outcome: increased soft tissue length   03/19/23 NMRE: RUSI with transperineal view to look at puborectal angle changes with contract and bulge Perianally looking at contract and bulge with breathing in hook lying, adding block, varying LE and upper extremity positions; cues to let "balloon deflate" for not pushing belly out  Manual (not performed today) SCM and scalenes Rt, bil masseter, Lt upper trap, bil lumbar and thoracic paraspinals, cervical traction   03/12/23 Manual: SCM and scalenes Rt, bil masseter, Lt upper trap, bil lumbar and thoracic paraspinals, cervical traction Trigger Point Dry-Needling  Treatment instructions: Expect mild to moderate muscle soreness. S/S of pneumothorax if dry needled over a lung field, and to seek immediate medical attention should they occur. Patient verbalized understanding of these instructions and education.  Patient Consent Given: Yes Education handout provided: Previously provided Muscles treated: SCM and scalenes Rt, bil masseter, Lt upper trap, bil lumbar and thoracic, paraspinals, suboccipitals bil Electrical stimulation performed: No Parameters: N/A Treatment response/outcome: twitch response and increased soft  tissue length  Exericse: Chin tuck Chin tuck with rotation can do with less intense pain that without chin tuck  02/26/23 Manual: SCM and scalenes Rt, bil masseter, Lt upper trap, bil lumbar and thoracic paraspinals Trigger Point Dry-Needling  Treatment instructions: Expect mild to moderate muscle soreness. S/S of pneumothorax if dry needled over a lung field, and to seek immediate medical attention should they occur. Patient  verbalized understanding of these instructions and education.  Patient Consent Given: Yes Education handout provided: Previously provided Muscles treated: SCM and scalenes Rt, bil masseter, Lt upper trap, bil lumbar and thoracic paraspinals Electrical stimulation performed: No Parameters: N/A Treatment response/outcome: twitch response and increased soft tissue length      PATIENT EDUCATION:  Education details: Access Code: [email or web address]  Person educated: Patient Education method: Explanation Education comprehension: verbalized understanding  HOME EXERCISE PROGRAM: Access Code: [email or web address] URL: [email or web address] Date: 03/12/2023 Prepared by: Dwana Curd  Exercises - Supine Hip Internal and External Rotation  - 1 x daily - 7 x weekly - 1 sets - 10 reps - 5 sec hold - Supine Upper Trunk and Cervical Rotation with Opposite Lower Trunk Rotation (Mirrored)  - 1 x daily - 7 x weekly - 1 sets - 10 reps - 5 hold - Sidelying Mid Thoracic Rotation (Mirrored)  - 1 x daily - 7 x weekly - 3 sets - 10 reps - Supine Transversus Abdominis Bracing - Hands on Ground  - 1 x daily - 7 x weekly - 3 sets - 10 reps - Ball squeeze with Kegel  - 3 x daily - 7 x weekly - 1 sets - 10 reps - 3 sec hold - Hooklying Small March  - 1 x daily - 7 x weekly - 2 sets - 10 reps - Seated Figure 4 Piriformis Stretch  - 1 x daily - 7 x weekly - 1 sets - 3 reps - 30 hold - Seated Thoracic Flexion and Rotation with Arms Crossed  - 1 x daily - 7 x weekly - 1 sets - 10 reps - 5 sec hold - Seated Cervical Rotation AROM  - 3 x daily - 7 x weekly - 1 sets - 10 reps - 5 hold - Seated Cervical Retraction and Rotation  - 1 x daily - 7 x weekly - 3 sets - 10 reps  Patient Education - Trigger Point Dry Needling  ASSESSMENT:  CLINICAL IMPRESSION: Pt did well with manual techniques and had palpable increased soft tissue length and pliability.  Pt is ind with exercises and has met or had reached a plateau on  goals.  Pt will d/c with HEP at this time.  OBJECTIVE IMPAIRMENTS: decreased activity tolerance, decreased coordination, decreased endurance, decreased ROM, decreased strength, impaired flexibility, impaired sensation, impaired tone, postural dysfunction, and pain.   ACTIVITY LIMITATIONS: continence and toileting  PARTICIPATION LIMITATIONS: community activity  PERSONAL FACTORS: 3+ comorbidities: history of HTN, arthritis, asthma, chronic constipation, hypothyroidism (most recent TFT's wnl), sleep apnea, essential tremor, fibromyalgia, overactive bladder, hysterectomy, and cholecystectomy  are also affecting patient's functional outcome.   REHAB POTENTIAL: Excellent  CLINICAL DECISION MAKING: Evolving/moderate complexity  EVALUATION COMPLEXITY: Moderate   GOALS: Goals reviewed with patient? Yes  SHORT TERM GOALS: Target date: 11/28/22  Pt will be ind with toileting techniques Baseline: Goal status: MET  2.  Pt will be ind with initial HEP Baseline:  Goal status: MET  3.  Pt will be able to isolate external anal sphincter Baseline: can do at home with tactile  cues Goal status: MET   LONG TERM GOALS: Target date: 05/25/23 Updated 04/02/23  Pt will be independent with advanced HEP to maintain improvements made throughout therapy  Baseline:  Goal status: MET  2.  Pt will report 75% reduction of pain due to improvements in posture, strength, and muscle length  Baseline: 30-40% less overall Goal status: NOT MET  3.  Pt will have 75% reduced leakage during a typical week.  Baseline: no leakage Goal status: MET  4.  Pt will have more solid stool for improved emptying Baseline: depends on on what I eat, vegetables are still causing diarrhea Goal status: MET  5.  Pt will have neck pain reduced to 2/10 at most during typical day Baseline: 8/10 on the left side Goal status: NOT MET     PLAN:  PT FREQUENCY: 1x/week  PT DURATION: 12 weeks  PLANNED INTERVENTIONS:  Therapeutic exercises, Therapeutic activity, Neuromuscular re-education, Balance training, Gait training, Patient/Family education, Self Care, Joint mobilization, Dry Needling, Electrical stimulation, Cryotherapy, Moist heat, scar mobilization, Taping, Traction, Ultrasound, Biofeedback, Manual therapy, and Re-evaluation  PLAN FOR NEXT SESSION:    Junious Silk, PT 04/02/2023, 2:18 PM  PHYSICAL THERAPY DISCHARGE SUMMARY  Visits from Start of Care: 16  Current functional level related to goals / functional outcomes: See above goals   Remaining deficits: See above   Education / Equipment: HEP   Patient agrees to discharge. Patient goals were partially met. Patient is being discharged due to being pleased with the current functional level.  Russella Dar, PT, DPT 04/02/23 2:40 PM

## 2023-04-02 ENCOUNTER — Ambulatory Visit: Payer: Medicare Other | Admitting: Physical Therapy

## 2023-04-02 DIAGNOSIS — M6281 Muscle weakness (generalized): Secondary | ICD-10-CM

## 2023-04-02 DIAGNOSIS — R293 Abnormal posture: Secondary | ICD-10-CM

## 2023-04-02 DIAGNOSIS — R279 Unspecified lack of coordination: Secondary | ICD-10-CM

## 2023-04-04 ENCOUNTER — Ambulatory Visit
Admission: RE | Admit: 2023-04-04 | Discharge: 2023-04-04 | Disposition: A | Discharge status: Home / Self Care | Payer: Medicare Other | Source: Ambulatory Visit | Attending: Neurology | Admitting: Neurology

## 2023-04-04 ENCOUNTER — Other Ambulatory Visit: Payer: Medicare Other

## 2023-04-04 DIAGNOSIS — G250 Essential tremor: Secondary | ICD-10-CM

## 2023-04-04 DIAGNOSIS — R270 Ataxia, unspecified: Secondary | ICD-10-CM

## 2023-04-04 DIAGNOSIS — M542 Cervicalgia: Secondary | ICD-10-CM

## 2023-04-04 DIAGNOSIS — R261 Paralytic gait: Secondary | ICD-10-CM

## 2023-04-04 DIAGNOSIS — G8929 Other chronic pain: Secondary | ICD-10-CM

## 2023-04-04 DIAGNOSIS — W19XXXD Unspecified fall, subsequent encounter: Secondary | ICD-10-CM

## 2023-04-04 DIAGNOSIS — R159 Full incontinence of feces: Secondary | ICD-10-CM

## 2023-04-10 ENCOUNTER — Telehealth: Payer: Self-pay | Admitting: Neurology

## 2023-04-10 ENCOUNTER — Telehealth: Payer: Self-pay | Admitting: Pharmacy Technician

## 2023-04-10 ENCOUNTER — Other Ambulatory Visit (HOSPITAL_COMMUNITY): Payer: Self-pay

## 2023-04-10 NOTE — Telephone Encounter (Signed)
Patient had a MRI done a month ago. Patient states that she has not heard from anyone with a report and is needing to know what to do/KB

## 2023-04-10 NOTE — Telephone Encounter (Signed)
[employer] verification has been submitted. Benefit Verification:  [id number]  Pharmacy PA has been submitted for BOTOX 300U via PENDING. INSURANCE: [employer] and MEDICARE A/B DATE SUBMITTED: PENDING KEY: PENDING   PA would need to be submitted likely to the [place]. [employer] report has been submitted and is pending.

## 2023-04-11 NOTE — Telephone Encounter (Signed)
Will await [employer] report. PA likely not required and [employer] typically picks up remaining costs after Medicare.

## 2023-04-12 ENCOUNTER — Telehealth: Payer: Self-pay

## 2023-04-12 ENCOUNTER — Other Ambulatory Visit: Payer: Self-pay

## 2023-04-12 DIAGNOSIS — M542 Cervicalgia: Secondary | ICD-10-CM

## 2023-04-12 DIAGNOSIS — G243 Spasmodic torticollis: Secondary | ICD-10-CM

## 2023-04-12 NOTE — Telephone Encounter (Signed)
Pharmacy Patient Advocate Encounter- Botox BIV-Medical Benefit:  Buy/Bill J code: [id number] CPT code: 28413 Dx Code: G24.3

## 2023-04-12 NOTE — Telephone Encounter (Signed)
Talked to patient about MRI  results she agreed to go to neuro sx for opinion

## 2023-04-15 NOTE — Telephone Encounter (Signed)
Pt is sch °

## 2023-04-23 ENCOUNTER — Encounter: Payer: Self-pay | Admitting: Internal Medicine

## 2023-04-24 ENCOUNTER — Other Ambulatory Visit: Payer: Self-pay

## 2023-04-24 MED ORDER — PREMARIN 0.625 MG/GM VA CREA: 1.0000 | TOPICAL_CREAM | Freq: Every day | VAGINAL | 12 refills | Status: DC

## 2023-04-26 ENCOUNTER — Ambulatory Visit (INDEPENDENT_AMBULATORY_CARE_PROVIDER_SITE_OTHER): Payer: Medicare Other | Admitting: Podiatry

## 2023-04-26 ENCOUNTER — Encounter: Payer: Self-pay | Admitting: Podiatry

## 2023-04-26 ENCOUNTER — Encounter: Payer: Self-pay | Admitting: Neurology

## 2023-04-26 ENCOUNTER — Other Ambulatory Visit: Payer: Self-pay | Admitting: Internal Medicine

## 2023-04-26 DIAGNOSIS — B351 Tinea unguium: Secondary | ICD-10-CM

## 2023-04-26 DIAGNOSIS — M79675 Pain in left toe(s): Secondary | ICD-10-CM

## 2023-04-26 DIAGNOSIS — M79674 Pain in right toe(s): Secondary | ICD-10-CM | POA: Diagnosis not present

## 2023-04-26 MED ORDER — MIRABEGRON ER 50 MG PO TB24: 50.0000 mg | ORAL_TABLET | Freq: Every day | ORAL | 0 refills | Status: DC

## 2023-04-27 ENCOUNTER — Encounter: Payer: Self-pay | Admitting: Internal Medicine

## 2023-04-29 ENCOUNTER — Other Ambulatory Visit: Payer: Self-pay

## 2023-04-29 DIAGNOSIS — G250 Essential tremor: Secondary | ICD-10-CM

## 2023-04-29 MED ORDER — PROPRANOLOL HCL 20 MG PO TABS: 40.0000 mg | ORAL_TABLET | Freq: Every day | ORAL | 1 refills | Status: DC

## 2023-04-29 MED ORDER — MIRABEGRON ER 50 MG PO TB24: 50.0000 mg | ORAL_TABLET | Freq: Every day | ORAL | 3 refills | Status: DC

## 2023-04-29 NOTE — Addendum Note (Signed)
Addended by: Deatra James on: 04/29/2023 02:12 PM   Modules accepted: Orders

## 2023-04-30 NOTE — Progress Notes (Signed)
Subjective: Chief Complaint  Patient presents with   Debridement    Trim toenails    77 year old female with the above concerns.  She is nails are thickened elongated she not able to trim them herself. No open lesions. No other concerns today.  Objective: AAO x3, NAD DP/PT pulses palpable bilaterally, CRT less than 3 seconds Nails are hypertrophic, dystrophic, brittle, discolored, elongated 10.  Incurvation of the nails without any signs of infection.  Most notably the right hallux toenail is the most thickened.  Tenderness nails 1-5 bilaterally.  No open lesions or pre-ulcerative lesions are identified today.   No pain with calf compression, swelling, warmth, erythema  Assessment: Ingrown toenail, symptomatic onychomycosis  Plan: -All treatment options discussed with the patient including all alternatives, risks, complications.  -Sharply debrided nails x10 without any complications or bleeding.  Monitor ingrown toenails for any signs or symptoms of infection. -Patient encouraged to call the office with any questions, concerns, change in symptoms.   Vivi Barrack DPM

## 2023-05-06 ENCOUNTER — Other Ambulatory Visit: Payer: Self-pay | Admitting: Internal Medicine

## 2023-05-24 ENCOUNTER — Ambulatory Visit (INDEPENDENT_AMBULATORY_CARE_PROVIDER_SITE_OTHER): Payer: Medicare Other | Admitting: Neurology

## 2023-05-24 DIAGNOSIS — G243 Spasmodic torticollis: Secondary | ICD-10-CM | POA: Diagnosis not present

## 2023-05-24 MED ORDER — ONABOTULINUMTOXINA 100 UNITS IJ SOLR
200.0000 [IU] | Freq: Once | INTRAMUSCULAR | Status: AC
  Administered 2023-05-24: 200 [IU] via INTRAMUSCULAR

## 2023-05-24 MED ORDER — ONABOTULINUMTOXINA 100 UNITS IJ SOLR: 100.0000 [IU] | Freq: Once | INTRAMUSCULAR | Status: DC

## 2023-05-24 NOTE — Procedures (Signed)
[facility]   Procedure Note Botox  Attending: Dr. Lurena Joiner Avigayil Ton  Preoperative Diagnosis(es): Cervical Dystonia  Result History  N/a  Consent obtained from: The patient Benefits discussed included, but were not limited to decreased muscle tightness, increased joint range of motion, and decreased pain.  Risk discussed included, but were not limited pain and discomfort, bleeding, bruising, excessive weakness, venous thrombosis, muscle atrophy and dysphagia.  A copy of the patient medication guide was given to the patient which explains the blackbox warning.  Patients identity and treatment sites confirmed Yes.  .  Details of Procedure: Skin was cleaned with alcohol.  A 30 gauge, 25mm  needle was introduced to the target muscle, except for posterior splenius where 27 gauge, 1.5 inch needle used.   Prior to injection, the needle plunger was aspirated to make sure the needle was not within a blood vessel.  There was no blood retrieved on aspiration.    Following is a summary of the muscles injected  And the amount of Botulinum toxin used:   Dilution 0.9% preservative free saline mixed with 100 u Botox type A to make 10 U per 0.1cc  Injections  Location Left  Right Units Number of sites        Sternocleidomastoid 50  50 1  Splenius Capitus, posterior approach  80 80 1  Splenius Capitus, lateral approach  30 30 1         Trapezius 20/20  40 2        TOTAL UNITS:   200    Agent: Botulinum Type A ( Onobotulinum Toxin type A ).  2 vials of Botox were used, each containing 100 units and freshly diluted with 1 mL of sterile, non-preserved saline   Total injected (Units): 200  Total wasted (Units): none wasted   Pt tolerated procedure well without complications.   Reinjection is anticipated in 3 months.

## 2023-06-05 ENCOUNTER — Other Ambulatory Visit: Payer: Self-pay

## 2023-06-05 ENCOUNTER — Encounter: Payer: Self-pay | Admitting: Internal Medicine

## 2023-06-05 MED ORDER — BUSPIRONE HCL 5 MG PO TABS: 5.0000 mg | ORAL_TABLET | Freq: Two times a day (BID) | ORAL | 0 refills | Status: DC

## 2023-07-03 ENCOUNTER — Telehealth: Payer: Self-pay | Admitting: Internal Medicine

## 2023-07-03 NOTE — Telephone Encounter (Signed)
Prescription Request  07/03/2023  LOV: 09/14/2022    - Patient wants 2 weeks worth sent to [facility] - [address]  What is the name of the medication or equipment? pregablin  Have you contacted your pharmacy to request a refill? Yes   Which pharmacy would you like this sent to?  [facility] - [place], [place] - [address] [place] [address] Phone: [phone] Fax: [phone]  Phone: [phone] Fax: [phone]    Patient notified that their request is being sent to the clinical staff for review and that they should receive a response within 2 business days.   Please advise at Mobile [phone] (mobile)

## 2023-07-03 NOTE — Telephone Encounter (Signed)
Ok to do

## 2023-07-03 NOTE — Telephone Encounter (Signed)
Ok to refill 

## 2023-07-04 MED ORDER — BUSPIRONE HCL 5 MG PO TABS: 5.0000 mg | ORAL_TABLET | Freq: Two times a day (BID) | ORAL | 0 refills | Status: DC

## 2023-07-04 NOTE — Progress Notes (Signed)
Assessment/Plan:    1.  Tremor              -She has become more frustrated with leg and hand tremor, which certainly may be dystonic in nature as well, especially since it is not responding well to primidone/propranolol.  She and I discussed this today.  She has not found higher dosages of primidone to be more effective and has found she has more cognitive dulling.  -She did not think that her first pattern of injections with Botox was very helpful.  I would change the pattern of injections to involve the right>> left SCM and left splenius capitis.  I will also try to increase the dose.  -Discussed weighted gloves.             -For now we will continue propranolol, 20 mg, 2 tablets at bedtime             - She will continue primidone, 50 mg, 2 tablets twice per day.             -not interested in dbs right now but may be interested in focused ultrasound in the future.             -Has had kidney stones so really is not a good candidate for topiramate.             -pt has been seen by Dr. Rulon Eisenmenger [facility]) and by Dr. Neita Carp (movement at [facility]).  She felt she did not have a good experience at [facility].     2.  Possible TIA, March, 2022             -Event was very short-lived (seconds) so it is questionable whether this really represented TIA.  Nonetheless, Dr. Rulon Eisenmenger at [facility] worked this up extensively.  MRI and MRA were negative for anything acute.  Zio patch was completed and was unremarkable.  Patient now on aspirin, 81 mg daily.   3.  Gait spasticity             -Patient has had a workup for this that was unrevealing.  Symptoms started in 2018.    -MRI brain and cervical spine recently were unrevealing as recently as 2024.  MRI of the lumbar spine was also done in 2023..  4.  Hx of anxiety/adjustment d/o  -She would really like to limit her medication load.  Her BuSpar was added when her husband died 4 years ago.  She asks me about that today.  She asks if it helps or hurts tremor.  It  really does not do either and I have no objection, from my standpoint, if it is discontinued.  I told her to discuss with her primary care physician, who is prescribing it.  5.  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  They did not want to do injections until they saw if Botox was helpful.  She is going to try Botox 1 more time and if not helpful, to go back to neurosurgery for injections.  Subjective:   Mariah Snyder was seen today in follow up.  She had her first botox injections on 9/6.  She didn't think that she got benefit from the injections.  Since last visit, she also saw pain management at neurosurgery regarding multilevel disc degeneration.  I did not want to do injections until she had Botox done to see if that would help the pain.  MRI  brain done since last visit was unremarkable.  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  Prior cervical spine MRI 2015 at [facility]:  degen changes with ? Cord signal change at C5 MRI Lumbar 10/2021:  wnl  ALLERGIES:   Allergies  Allergen Reactions   Doxycycline     N/V, diarrhea   Sulfa Antibiotics Other (See Comments)    Causes headaches.     Grass Extracts [Gramineae Pollens] Itching and Other (See Comments)    Runny itchy eyes & nose.   Molds & Smuts Itching and Other (See Comments)    Runny itchy eyes & nose.    CURRENT MEDICATIONS:  No outpatient medications have been marked as taking for the 07/05/23 encounter (Office Visit) with Maurice Fotheringham, Octaviano Batty, DO.      Objective:    PHYSICAL EXAMINATION:    VITALS:   Vitals:   07/05/23 1424  BP: 136/76  Pulse: 71  SpO2: 97%  Weight: 157 lb 12.8 oz (71.6 kg)  Height: 5\' 4"  (1.626 m)      GEN:  The patient appears stated age and is in NAD. HEENT:  Normocephalic, atraumatic.  The mucous membranes are moist.  Cardiovascular: Regular rate rhythm Lungs: Clear to auscultation bilaterally Neck/HEME:  There are no carotid bruits bilaterally. Head tremor in the "yes" direction but head  Head appears to be more to the left at this time  Neurological examination:  Orientation: The patient is alert and oriented x3. Cranial nerves: There is good facial symmetry. The speech is fluent and clear. Soft palate rises symmetrically and there is no tongue deviation. Hearing is intact to conversational tone. Sensation: Sensation is intact to light touch throughout.  Vibration intact bilateral ankle Motor: Strength is at least antigravity x4.   Movement examination: Tone: There is normal tone in the UE/LE Abnormal movements: there is head tremor, as above.  There is minimal postural tremor, mild, RUE>LUE.  Coordination:  There is no decremation with RAM's Gait and Station: Not tested today I have reviewed and interpreted the following labs independently   Chemistry      Component Value Date/Time   NA 140 05/18/2022 1639   K 4.5 05/18/2022 1639   CL 106 05/18/2022 1639   CO2 29 05/18/2022 1639   BUN 16 05/18/2022 1639   CREATININE 0.89 05/18/2022 1639      Component Value Date/Time   CALCIUM 9.3 05/18/2022 1639   ALKPHOS 94 05/18/2022 1639   AST 15 05/18/2022 1639   ALT 13 05/18/2022 1639   BILITOT 0.2 05/18/2022 1639      Lab Results  Component Value Date   WBC 7.6 05/18/2022   HGB 12.9 05/18/2022   HCT 38.8 05/18/2022   MCV 87.0 05/18/2022   PLT 283.0 05/18/2022   Lab Results  Component Value Date   TSH 0.87 05/18/2022     Chemistry      Component Value Date/Time   NA 140 05/18/2022 1639   K 4.5 05/18/2022 1639   CL 106 05/18/2022 1639   CO2 29 05/18/2022 1639   BUN 16 05/18/2022 1639   CREATININE 0.89 05/18/2022 1639      Component Value Date/Time    CALCIUM 9.3 05/18/2022 1639   ALKPHOS 94  05/18/2022 1639   AST 15 05/18/2022 1639   ALT 13 05/18/2022 1639   BILITOT 0.2 05/18/2022 1639         Total time spent on today's visit was 30 minutes, including both face-to-face time and nonface-to-face time.  Time included that spent on review of records (prior notes available to me/labs/imaging if pertinent), discussing treatment and goals, answering patient's questions and coordinating care.  Cc:  Myrlene Broker, MD

## 2023-07-04 NOTE — Telephone Encounter (Signed)
She should still have refill 6 month supply done June 2024.

## 2023-07-04 NOTE — Telephone Encounter (Signed)
We need to find out fill dates and amounts from pharmacy since lyrica is controlled we typically do not fill early. She should have gotten 3 month fill of lyrica Sept 2024. I have sent in buspar

## 2023-07-04 NOTE — Telephone Encounter (Signed)
Pt states that she spoke with [facility] and they stated they don't have any of her medications ok to refill?

## 2023-07-05 ENCOUNTER — Telehealth: Payer: Self-pay | Admitting: Neurology

## 2023-07-05 ENCOUNTER — Encounter: Payer: Self-pay | Admitting: Neurology

## 2023-07-05 ENCOUNTER — Ambulatory Visit (INDEPENDENT_AMBULATORY_CARE_PROVIDER_SITE_OTHER): Payer: Medicare Other | Admitting: Neurology

## 2023-07-05 VITALS — BP 136/76 | HR 71 | Ht 64.0 in | Wt 157.8 lb

## 2023-07-05 DIAGNOSIS — G243 Spasmodic torticollis: Secondary | ICD-10-CM

## 2023-07-05 DIAGNOSIS — M47812 Spondylosis without myelopathy or radiculopathy, cervical region: Secondary | ICD-10-CM | POA: Diagnosis not present

## 2023-07-05 DIAGNOSIS — G250 Essential tremor: Secondary | ICD-10-CM | POA: Diagnosis not present

## 2023-07-05 MED ORDER — PROPRANOLOL HCL 20 MG PO TABS: 40.0000 mg | ORAL_TABLET | Freq: Every day | ORAL | 1 refills | Status: DC

## 2023-07-05 MED ORDER — PRIMIDONE 50 MG PO TABS: 100.0000 mg | ORAL_TABLET | Freq: Two times a day (BID) | ORAL | 1 refills | Status: DC

## 2023-07-05 NOTE — Telephone Encounter (Signed)
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

## 2023-07-05 NOTE — Telephone Encounter (Signed)
Sent to PA team for increase

## 2023-07-08 MED ORDER — PREGABALIN 75 MG PO CAPS: 75.0000 mg | ORAL_CAPSULE | Freq: Two times a day (BID) | ORAL | 0 refills | Status: DC

## 2023-07-08 MED ORDER — PREGABALIN 75 MG PO CAPS: 75.0000 mg | ORAL_CAPSULE | Freq: Two times a day (BID) | ORAL | 1 refills | Status: DC

## 2023-07-08 NOTE — Telephone Encounter (Signed)
Called pt and she reports she has been taking this medication BID since she has been trying to eventually get off this medication but can't due to symptoms. Pt states she only has 2 pills left and would like for a short supply [facility] and the rest sent to [facility] by mail. It usually takes them 2 weeks to get her medication so she would like [facility]. I advised her I will have to see if we are able to do that as this is a controlled substance but will let her know what Dr. Okey Dupre is able to do once rx is sent in.

## 2023-07-08 NOTE — Telephone Encounter (Signed)
Please see other encounter, Mychart.  "Called and spoke w [facility] by mail and they report last fill of pt's Lyrica was on 10/10/2022 and was given 270 capsules with no refill. I asked if they received a prescription in June and she said they have not, the last prescription received/filled was in January." Routed to PCP in other encounter

## 2023-07-08 NOTE — Telephone Encounter (Signed)
Pharmacy called about Lyrica and trying to get Rx refilled. I also read the pharmacy the last message that was put in from Dr. Okey Dupre. Please advise.

## 2023-07-08 NOTE — Addendum Note (Signed)
Addended by: Hillard Danker A on: 07/08/2023 09:47 AM   Modules accepted: Orders

## 2023-07-08 NOTE — Telephone Encounter (Signed)
Called and spoke w [facility] by mail and they report last fill of pt's Lyrica was on 10/10/2022 and was given 270 capsules with no refill. I asked if they received a prescription in June and she said they have not, the last prescription received/filled was in January.

## 2023-07-15 ENCOUNTER — Encounter: Payer: Self-pay | Admitting: Podiatry

## 2023-07-15 ENCOUNTER — Ambulatory Visit (INDEPENDENT_AMBULATORY_CARE_PROVIDER_SITE_OTHER): Payer: Medicare Other | Admitting: Podiatry

## 2023-07-15 DIAGNOSIS — M79674 Pain in right toe(s): Secondary | ICD-10-CM

## 2023-07-15 DIAGNOSIS — M79675 Pain in left toe(s): Secondary | ICD-10-CM

## 2023-07-15 DIAGNOSIS — B351 Tinea unguium: Secondary | ICD-10-CM | POA: Diagnosis not present

## 2023-07-15 NOTE — Progress Notes (Signed)

## 2023-07-16 ENCOUNTER — Ambulatory Visit: Payer: Medicare Other | Admitting: Internal Medicine

## 2023-07-19 ENCOUNTER — Other Ambulatory Visit: Payer: Self-pay

## 2023-07-19 ENCOUNTER — Encounter: Payer: Self-pay | Admitting: Neurology

## 2023-07-19 DIAGNOSIS — G250 Essential tremor: Secondary | ICD-10-CM

## 2023-07-19 MED ORDER — PRIMIDONE 50 MG PO TABS: 200.0000 mg | ORAL_TABLET | Freq: Two times a day (BID) | ORAL | 0 refills | Status: DC

## 2023-07-19 NOTE — Telephone Encounter (Signed)
Patient let me know that Botox only worked for two days and then she had no relief. [facility]

## 2023-07-22 ENCOUNTER — Other Ambulatory Visit: Payer: Self-pay

## 2023-07-22 DIAGNOSIS — G250 Essential tremor: Secondary | ICD-10-CM

## 2023-07-22 MED ORDER — PRIMIDONE 50 MG PO TABS: 200.0000 mg | ORAL_TABLET | Freq: Two times a day (BID) | ORAL | 0 refills | Status: DC

## 2023-07-22 NOTE — Telephone Encounter (Signed)
RX sent to [facility] by mail

## 2023-07-23 ENCOUNTER — Encounter: Payer: Self-pay | Admitting: Internal Medicine

## 2023-07-23 ENCOUNTER — Ambulatory Visit: Payer: Medicare Other | Admitting: Internal Medicine

## 2023-07-23 VITALS — BP 128/79 | HR 71 | Temp 97.8°F | Ht 64.0 in | Wt 155.0 lb

## 2023-07-23 DIAGNOSIS — G8929 Other chronic pain: Secondary | ICD-10-CM

## 2023-07-23 DIAGNOSIS — M5442 Lumbago with sciatica, left side: Secondary | ICD-10-CM

## 2023-07-23 DIAGNOSIS — I10 Essential (primary) hypertension: Secondary | ICD-10-CM

## 2023-07-23 DIAGNOSIS — E559 Vitamin D deficiency, unspecified: Secondary | ICD-10-CM | POA: Diagnosis not present

## 2023-07-23 DIAGNOSIS — E063 Autoimmune thyroiditis: Secondary | ICD-10-CM | POA: Diagnosis not present

## 2023-07-23 DIAGNOSIS — Z634 Disappearance and death of family member: Secondary | ICD-10-CM

## 2023-07-23 DIAGNOSIS — E7800 Pure hypercholesterolemia, unspecified: Secondary | ICD-10-CM | POA: Diagnosis not present

## 2023-07-23 LAB — COMPREHENSIVE METABOLIC PANEL
ALT: 16 U/L (ref 0–35)
AST: 18 U/L (ref 0–37)
Albumin: 4.1 g/dL (ref 3.5–5.2)
Alkaline Phosphatase: 80 U/L (ref 39–117)
BUN: 17 mg/dL (ref 6–23)
CO2: 29 meq/L (ref 19–32)
Calcium: 9.6 mg/dL (ref 8.4–10.5)
Chloride: 104 meq/L (ref 96–112)
Creatinine, Ser: 0.77 mg/dL (ref 0.40–1.20)
GFR: 73.91 mL/min (ref >60.00)
Glucose, Bld: 79 mg/dL (ref 70–99)
Potassium: 4.1 meq/L (ref 3.5–5.1)
Sodium: 140 meq/L (ref 135–145)
Total Bilirubin: 0.3 mg/dL (ref 0.2–1.2)
Total Protein: 7.5 g/dL (ref 6.0–8.3)

## 2023-07-23 LAB — CBC
HCT: 42.5 % (ref 36.0–46.0)
Hemoglobin: 13.7 g/dL (ref 12.0–15.0)
MCHC: 32.3 g/dL (ref 30.0–36.0)
MCV: 90 fL (ref 78.0–100.0)
Platelets: 275 10*3/uL (ref 150.0–400.0)
RBC: 4.72 Mil/uL (ref 3.87–5.11)
RDW: 14 % (ref 11.5–15.5)
WBC: 6.6 10*3/uL (ref 4.0–10.5)

## 2023-07-23 LAB — VITAMIN D 25 HYDROXY (VIT D DEFICIENCY, FRACTURES): VITD: 40.59 ng/mL (ref 30.00–100.00)

## 2023-07-23 LAB — TSH: TSH: 1.54 u[IU]/mL (ref 0.35–5.50)

## 2023-07-23 LAB — LIPID PANEL
Cholesterol: 173 mg/dL (ref 0–200)
HDL: 51.1 mg/dL (ref >39.00)
LDL Cholesterol: 78 mg/dL (ref 0–99)
NonHDL: 121.44
Total CHOL/HDL Ratio: 3
Triglycerides: 217 mg/dL — ABNORMAL HIGH (ref 0.0–149.0)
VLDL: 43.4 mg/dL — ABNORMAL HIGH (ref 0.0–40.0)

## 2023-07-23 LAB — T4, FREE: Free T4: 1.15 ng/dL (ref 0.60–1.60)

## 2023-07-23 MED ORDER — CLOBETASOL PROPIONATE 0.05 % EX OINT: TOPICAL_OINTMENT | CUTANEOUS | 11 refills | Status: AC

## 2023-07-23 MED ORDER — OMEPRAZOLE 20 MG PO CPDR: 20.0000 mg | DELAYED_RELEASE_CAPSULE | Freq: Every day | ORAL | 1 refills | Status: AC | PRN

## 2023-07-23 MED ORDER — LISINOPRIL 5 MG PO TABS: 5.0000 mg | ORAL_TABLET | Freq: Every day | ORAL | 3 refills | Status: AC

## 2023-07-23 MED ORDER — AMLODIPINE BESYLATE 5 MG PO TABS: 5.0000 mg | ORAL_TABLET | Freq: Every day | ORAL | 3 refills | Status: DC

## 2023-07-23 MED ORDER — CHOLESTYRAMINE 4 G PO PACK: 4.0000 g | PACK | Freq: Every day | ORAL | 0 refills | Status: DC

## 2023-07-23 MED ORDER — SIMVASTATIN 20 MG PO TABS: 20.0000 mg | ORAL_TABLET | Freq: Every day | ORAL | 3 refills | Status: AC

## 2023-07-23 MED ORDER — VITAMIN D3 50 MCG (2000 UT) PO CAPS: 2000.0000 [IU] | ORAL_CAPSULE | Freq: Every day | ORAL | 3 refills | Status: AC

## 2023-07-23 NOTE — Progress Notes (Signed)
   Subjective:   Patient ID: Mariah Snyder, female    DOB: 07-10-1946, 77 y.o.   MRN: [id number]  HPI The patient is a 77 YO female coming in for follow up. Wants to wean down on lyrica gradually and wean off buspar. New low back pain in the last 6 months after a fall.  Review of Systems  Constitutional:  Positive for activity change. Negative for appetite change, chills, fatigue, fever and unexpected weight change.  HENT: Negative.    Eyes: Negative.   Respiratory: Negative.  Negative for cough, chest tightness and shortness of breath.   Cardiovascular: Negative.  Negative for chest pain, palpitations and leg swelling.  Gastrointestinal: Negative.  Negative for abdominal distention, abdominal pain, constipation, diarrhea, nausea and vomiting.  Musculoskeletal:  Positive for arthralgias, back pain and myalgias. Negative for gait problem and joint swelling.  Skin: Negative.   Neurological: Negative.   Psychiatric/Behavioral: Negative.      Objective:  Physical Exam Constitutional:      Appearance: She is well-developed.  HENT:     Head: Normocephalic and atraumatic.  Cardiovascular:     Rate and Rhythm: Normal rate and regular rhythm.  Pulmonary:     Effort: Pulmonary effort is normal. No respiratory distress.     Breath sounds: Normal breath sounds. No wheezing or rales.  Abdominal:     General: Bowel sounds are normal. There is no distension.     Palpations: Abdomen is soft.     Tenderness: There is no abdominal tenderness. There is no rebound.  Musculoskeletal:        General: Tenderness present.     Cervical back: Normal range of motion.  Skin:    General: Skin is warm and dry.  Neurological:     Mental Status: She is alert and oriented to person, place, and time.     Coordination: Coordination abnormal.     Vitals:   07/23/23 0941  BP: 128/79  Pulse: 71  Temp: 97.8 F (36.6 C)  TempSrc: Oral  SpO2: 97%  Weight: 155 lb (70.3 kg)  Height: 5\' 4"  (1.626 m)     Assessment & Plan:

## 2023-07-23 NOTE — Patient Instructions (Addendum)
We will have you reduce buspar to 1 pill daily for 1-2 weeks then stop to see if this is still needed.   We will refill the lyrica (pregabalin) for 50 mg twice a day when due and then plan to keep that for 1-2 months and then reduce to 25 mg twice a day for 1-2 months then stop.  We are sending in cholestyramine powder to take 1 packet daily to help with diarrhea. If you get constipated take every other day. If still having diarrhea we can increase to 2 or 3 times a day.

## 2023-07-25 NOTE — Assessment & Plan Note (Addendum)
Worse in the last 6 months since a fall. Referral done to PT and if no resolution can investigate further. No red flag signs. She does not feel lyrica is helping with the pain and wishes to wean off this. She is taking lyrica 75 mg BID (although fill record does not support this). Pharmacy has just filled this so will keep until running out then will refill at 50 mg BID dosing for 1-2 months at least then 25 mg BID and then stop.

## 2023-07-26 NOTE — Assessment & Plan Note (Signed)
BP at goal on amlodipine 5 mg daily and lisinopril 5 mg daily and propranolol 40 mg qhs. Checking CMP and adjust as needed.

## 2023-07-26 NOTE — Assessment & Plan Note (Signed)
Checking lipid panel and adjust simvastatin 20 mg daily as needed.

## 2023-07-26 NOTE — Assessment & Plan Note (Signed)
Checking TSH and free T4 and adjust synthroid 112 mcg daily as needed.  

## 2023-07-26 NOTE — Assessment & Plan Note (Signed)
Using buspar 5 mg BID and asked her to use 5 mg daily for 1-2 weeks then stop. Will continue on coping skills alone and is doing okay.

## 2023-07-29 ENCOUNTER — Ambulatory Visit: Payer: Medicare Other | Admitting: Podiatry

## 2023-07-29 NOTE — Therapy (Incomplete)
OUTPATIENT PHYSICAL THERAPY THORACOLUMBAR EVALUATION   Patient Name: Mariah Snyder MRN: [id number] DOB:[birth year], 77 y.o., female Today's Date: 07/29/2023  END OF SESSION:   Past Medical History:  Diagnosis Date   Anemia    Anxiety    Arthritis    Asthma    Fibromyalgia    Gallstones    GERD (gastroesophageal reflux disease)    Hyperlipidemia    Hypertension    Hypothyroidism    Kidney stones    Peptic ulcer    Rectal prolapse    Sleep apnea    Urinary tract infection    Past Surgical History:  Procedure Laterality Date   bunyionectomy Left    CHOLECYSTECTOMY     ELBOW ARTHROSCOPY Left    LAPAROSCOPIC ASSISTED VAGINAL HYSTERECTOMY     SHOULDER ARTHROSCOPY Right    TONSILLECTOMY     Patient Active Problem List   Diagnosis Date Noted   Pain due to onychomycosis of toenails of both feet 07/15/2023   Constipation 09/14/2022   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/2021   Left hip pain 10/12/2021   High cholesterol 03/07/2021   Moderate persistent asthma without complication 11/01/2020   Bereavement 02/06/2019   Bilateral temporomandibular joint pain 02/18/2018   Right leg numbness 05/21/2017   Squamous blepharitis of upper and lower eyelids of both eyes 05/21/2017   Meibomian gland dysfunction (MGD) of upper and lower lids of both eyes 02/20/2017   Chronic left-sided low back pain with left-sided sciatica 12/31/2016   OAB (overactive bladder) 08/29/2015   Hallux limitus 01/24/2015   Balance problems 05/12/2013   Lichen sclerosus 05/12/2013   Healthcare maintenance 01/12/2013   Essential tremor 07/08/2012   Gastro-esophageal reflux disease with esophagitis 07/02/2011   Fibromyalgia 05/27/2011   Keratoconjunctivitis sicca 05/10/2011   Senile nuclear sclerosis 05/10/2011   Tear film insufficiency 05/10/2011   Essential (primary) hypertension 02/26/2011   Degenerative spinal arthritis 08/28/2000   Hashimoto's thyroiditis 08/28/1980    PCP: Hillard Danker, MD    REFERRING PROVIDER: Hillard Danker, MD  REFERRING DIAG: [phone] (ICD-10-CM) - Chronic left-sided low back pain with left-sided sciatica   Rationale for Evaluation and Treatment: Rehabilitation  THERAPY DIAG:  No diagnosis found.  ONSET DATE: ***  SUBJECTIVE:                                                                                                                                                                                           SUBJECTIVE STATEMENT:   From MD note: 07/23/23 Worse in the last 6 months since a fall. Referral done to PT and if no resolution can investigate further. No red flag signs. She does  not feel lyrica is helping with the pain and wishes to wean off this.  PERTINENT HISTORY:  Anxiety, fibromyalgia, HTN, sleep apnea  PAIN:  Are you having pain? Yes: NPRS scale: ***/10 Pain location: *** Pain description: *** Aggravating factors: *** Relieving factors: ***  PRECAUTIONS: None  RED FLAGS: None   WEIGHT BEARING RESTRICTIONS: No  FALLS:  Has patient fallen in last 6 months? {fallsyesno:27318}  LIVING ENVIRONMENT: Lives with: {OPRC lives with:25569::"lives with their family"} Lives in: {Lives in:25570} Stairs: {opstairs:27293} Has following equipment at home: {Assistive devices:23999}  OCCUPATION: ***  PLOF: Independent, Vocation/Vocational requirements: ***, and Leisure: ***  PATIENT GOALS: ***  NEXT MD VISIT: ***  OBJECTIVE:  Note: Objective measures were completed at Evaluation unless otherwise noted.  DIAGNOSTIC FINDINGS:  11/02/2021: MRI of lumbar spine Ordinary lumbar spine degeneration with widely patent canal and foramina throughout the lumbar spine.   Transitional vertebra numbered S1  PATIENT SURVEYS:  FOTO ***  SCREENING FOR RED FLAGS: Bowel or bladder incontinence: {Yes/No:304960894} Spinal tumors: {Yes/No:304960894} Cauda equina syndrome: {Yes/No:304960894} Compression fracture:  {Yes/No:304960894} Abdominal aneurysm: {Yes/No:304960894}  COGNITION: Overall cognitive status: {cognition:24006}     SENSATION: {sensation:27233}  MUSCLE LENGTH: Hamstrings: Right *** deg; Left *** deg Thomas test: Right *** deg; Left *** deg  POSTURE: {posture:25561}  PALPATION: ***  LUMBAR ROM:   AROM eval  Flexion   Extension   Right lateral flexion   Left lateral flexion   Right rotation   Left rotation    (Blank rows = not tested)  LOWER EXTREMITY ROM:     {AROM/PROM:27142}  Right eval Left eval  Hip flexion    Hip extension    Hip abduction    Hip adduction    Hip internal rotation    Hip external rotation    Knee flexion    Knee extension    Ankle dorsiflexion    Ankle plantarflexion    Ankle inversion    Ankle eversion     (Blank rows = not tested)  LOWER EXTREMITY MMT:    MMT Right eval Left eval  Hip flexion    Hip extension    Hip abduction    Hip adduction    Hip internal rotation    Hip external rotation    Knee flexion    Knee extension    Ankle dorsiflexion    Ankle plantarflexion    Ankle inversion    Ankle eversion     (Blank rows = not tested)  LUMBAR SPECIAL TESTS:  {lumbar special test:25242}  FUNCTIONAL TESTS:  {Functional tests:24029}  GAIT: Distance walked: *** Assistive device utilized: {Assistive devices:23999} Level of assistance: {Levels of assistance:24026} Comments: ***  TODAY'S TREATMENT:                                                                                                                               DATE: 07/29/23 HEP issued- see below     PATIENT EDUCATION:  Education  details: *** Person educated: Patient Education method: Explanation, Demonstration, and Handouts Education comprehension: verbalized understanding and returned demonstration  HOME EXERCISE PROGRAM: ***  ASSESSMENT:  CLINICAL IMPRESSION: Patient is a 77 y.o. female who was seen today for physical therapy  evaluation and treatment for LBP.   OBJECTIVE IMPAIRMENTS: {opptimpairments:25111}.   ACTIVITY LIMITATIONS: {activitylimitations:27494}  PARTICIPATION LIMITATIONS: {participationrestrictions:25113}  PERSONAL FACTORS: {Personal factors:25162} are also affecting patient's functional outcome.   REHAB POTENTIAL: Good  CLINICAL DECISION MAKING: {clinical decision making:25114}  EVALUATION COMPLEXITY: {Evaluation complexity:25115}   GOALS: Goals reviewed with patient? Yes  SHORT TERM GOALS: Target date: ***  Be independent in initial HEP Baseline: Goal status: INITIAL  2.  *** Baseline:  Goal status: INITIAL  3.  *** Baseline:  Goal status: INITIAL  4.  *** Baseline:  Goal status: INITIAL  5.  *** Baseline:  Goal status: INITIAL  6.  *** Baseline:  Goal status: INITIAL  LONG TERM GOALS: Target date: ***  Be independent in advanced HEP Baseline:  Goal status: INITIAL  2.  Improve FOTO to > or = to  Baseline:  Goal status: INITIAL  3.  *** Baseline:  Goal status: INITIAL  4.  *** Baseline:  Goal status: INITIAL  5.  *** Baseline:  Goal status: INITIAL  6.  *** Baseline:  Goal status: INITIAL  PLAN:  PT FREQUENCY: 2x/week  PT DURATION: 8 weeks  PLANNED INTERVENTIONS: 97110-Therapeutic exercises, 97530- Therapeutic activity, [id number]- Neuromuscular re-education, 97535- Self Care, 16109- Manual therapy, [id number]- Aquatic Therapy, 97014- Electrical stimulation (unattended), [id number]- Traction (mechanical), Patient/Family education, Taping, Dry Needling, Spinal manipulation, Spinal mobilization, Cryotherapy, and Moist heat.  PLAN FOR NEXT SESSION: review HEP, DN if pt agrees, body mechanics education  Lorrene Reid,  07/29/23 4:28 PM

## 2023-07-31 ENCOUNTER — Encounter: Payer: Self-pay | Admitting: Physical Therapy

## 2023-07-31 ENCOUNTER — Ambulatory Visit: Payer: Medicare Other | Attending: Internal Medicine | Admitting: Physical Therapy

## 2023-07-31 ENCOUNTER — Other Ambulatory Visit: Payer: Self-pay

## 2023-07-31 DIAGNOSIS — M5442 Lumbago with sciatica, left side: Secondary | ICD-10-CM | POA: Diagnosis present

## 2023-07-31 DIAGNOSIS — R262 Difficulty in walking, not elsewhere classified: Secondary | ICD-10-CM

## 2023-07-31 DIAGNOSIS — R531 Weakness: Secondary | ICD-10-CM | POA: Diagnosis not present

## 2023-07-31 DIAGNOSIS — M549 Dorsalgia, unspecified: Secondary | ICD-10-CM | POA: Diagnosis not present

## 2023-07-31 DIAGNOSIS — M6281 Muscle weakness (generalized): Secondary | ICD-10-CM

## 2023-07-31 DIAGNOSIS — G8929 Other chronic pain: Secondary | ICD-10-CM | POA: Insufficient documentation

## 2023-07-31 NOTE — Therapy (Signed)
OUTPATIENT PHYSICAL THERAPY THORACOLUMBAR EVALUATION   Patient Name: Mariah Snyder MRN: [id number] DOB:04-Feb-1946, 77 y.o., female Today's Date: 07/31/2023  END OF SESSION:  PT End of Session - 07/31/23 1012     Visit Number 1    Date for PT Re-Evaluation 09/25/23    Authorization Type Medicare    Progress Note Due on Visit 10    PT Start Time 1015    PT Stop Time 1055    PT Time Calculation (min) 40 min    Activity Tolerance Patient tolerated treatment well             Past Medical History:  Diagnosis Date   Anemia    Anxiety    Arthritis    Asthma    Fibromyalgia    Gallstones    GERD (gastroesophageal reflux disease)    Hyperlipidemia    Hypertension    Hypothyroidism    Kidney stones    Peptic ulcer    Rectal prolapse    Sleep apnea    Urinary tract infection    Past Surgical History:  Procedure Laterality Date   bunyionectomy Left    CHOLECYSTECTOMY     ELBOW ARTHROSCOPY Left    LAPAROSCOPIC ASSISTED VAGINAL HYSTERECTOMY     SHOULDER ARTHROSCOPY Right    TONSILLECTOMY     Patient Active Problem List   Diagnosis Date Noted   Pain due to onychomycosis of toenails of both feet 07/15/2023   Constipation 09/14/2022   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/2021   Left hip pain 10/12/2021   High cholesterol 03/07/2021   Moderate persistent asthma without complication 11/01/2020   Bereavement 02/06/2019   Bilateral temporomandibular joint pain 02/18/2018   Right leg numbness 05/21/2017   Squamous blepharitis of upper and lower eyelids of both eyes 05/21/2017   Meibomian gland dysfunction (MGD) of upper and lower lids of both eyes 02/20/2017   Chronic left-sided low back pain with left-sided sciatica 12/31/2016   OAB (overactive bladder) 08/29/2015   Hallux limitus 01/24/2015   Balance problems 05/12/2013   Lichen sclerosus 05/12/2013   Healthcare maintenance 01/12/2013   Essential tremor 07/08/2012   Gastro-esophageal reflux disease with esophagitis  07/02/2011   Fibromyalgia 05/27/2011   Keratoconjunctivitis sicca 05/10/2011   Senile nuclear sclerosis 05/10/2011   Tear film insufficiency 05/10/2011   Essential (primary) hypertension 02/26/2011   Degenerative spinal arthritis 08/28/2000   Hashimoto's thyroiditis 08/28/1980    PCP: Hillard Danker MD  REFERRING PROVIDER: Hillard Danker MD  REFERRING DIAG: [id number], G89.29 chronic left sided low back pain with left sided sciatica  Rationale for Evaluation and Treatment: Rehabilitation  THERAPY DIAG:  Back pain; weakness ONSET DATE: 6 months  SUBJECTIVE:  SUBJECTIVE STATEMENT: Had back problems for many years since 2001.  This recent injury happened when fell backwards onto butt 6 months ago, hit head.  Since that fall I can't sleep through the night.  Left SI flares up. Always had LE symptoms and takes Lyrica 20 years but trying to wean from that.  Leg cramps to feet mostly at night when not moving.  PERTINENT HISTORY:  Pelvic floor PT with Sallyanne Havers had DN Fibromyalgia (hard to get out of bed in the morning); history of HTN, arthritis, asthma, chronic constipation, hypothyroidism; sleep apnea, essential tremor being treated with Botox (1st round didn't work)  overactive bladder, hysterectomy, and cholecystectomy   Volunteers at the hospital  Able to walk 2 miles 2x/week  Pt states she has been "lazy" about ex in the past PAIN:   Are you having pain? Yes NPRS scale: 0/10 now while sitting Pain location: left SI region Pain orientation: Left and Lower  PAIN TYPE: sharp Pain description: intermittent and sharp  Aggravating factors: sitting long periods, lying on sofa on back; sleeping on left side, turning over in bed Relieving factors: lying prone pressing up with hands (learned in PT  years ago and still does them 1x/week 20 reps; sitting shorter periods; walk around short distances  PRECAUTIONS: fall risk    WEIGHT BEARING RESTRICTIONS: No  FALLS:  Yes 3-4x in the last 6 months; fell 3 nights ago when she got up to go to the bathroom, toe slipped and hit back on the toilet, light bruise mid thoracic region left   LIVING ENVIRONMENT: Stairs: bedroom downstairs (avoids takes longer than it used to  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   PLOF: Independent  PATIENT GOALS: I hope the pain is gone;  I would like to walk more, ride a bicycle, sleep more  NEXT MD VISIT: call as needed  OBJECTIVE:  Note: Objective measures were completed at Evaluation unless otherwise noted.  DIAGNOSTIC FINDINGS:   11/02/2021  RI LUMBAR SPINE WITHOUT CONTRAST   TECHNIQUE: Multiplanar, multisequence MR imaging of the lumbar spine was performed. No intravenous contrast was administered.   COMPARISON:  X-ray lumbar 10/11/2021.   FINDINGS: Segmentation: Transitional lumbosacral vertebra numbered S1. This places hypoplastic ribs or unfused transverse processes at L1 by prior radiography.   Alignment:  Negative for listhesis or significant scoliosis   Vertebrae: No fracture, evidence of discitis, or aggressive bone lesion.   Conus medullaris and cauda equina: Conus extends to the L2 level. Conus and cauda equina appear normal.   Paraspinal and other soft tissues: Negative. 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   Disc levels:   T12- L1: Minor disc bulging   L1-L2: Mild disc bulging with small right paracentral protrusion   L2-L3: Minor disc bulging   L3-L4: Mild disc bulging   L4-L5: Disc space narrowing and bulging with small right paracentral protrusion. Mild facet spurring    L5-S1:Disc narrowing and mild bulging.  Negative facets.   IMPRESSION: Ordinary lumbar spine degeneration with widely patent canal and foramina throughout the lumbar spine.   Transitional vertebra numbered S1.   PATIENT SURVEYS:  FOTO current score 69%, projected 68%   COGNITION: Overall cognitive status: Within functional limits for tasks assessed     MUSCLE LENGTH: Hamstrings: Right 65 deg; Left 60 deg Maisie Fus  test: Right 10 deg; Left 5 deg  POSTURE: decreased lumbar lordosis  PALPATION: No tender points identified today  LUMBAR ROM: able to do a partial squat to pick up small object from the floor  AROM eval  Flexion 70   Extension 20 painful  Right lateral flexion 15  Left lateral flexion 25  Right rotation   Left rotation    (Blank rows = not tested)  TRUNK STRENGTH:  Decreased activation of transverse abdominus muscles; abdominals 4-/5; decreased activation of lumbar multifidi; trunk extensors 4-/5  LOWER EXTREMITY ROM:   Grossly WFLS, some hip stiffness with internal and external rotation  LOWER EXTREMITY MMT:  Able to rise sit to stand without UE use Poor balance with SLS (needs UE support):  right/left pelvic drop indicating glute medius weakness left more than rigth   MMT Right eval Left eval  Hip flexion 4 4  Hip extension 4- 4-  Hip abduction 3+ 3-  Hip adduction    Hip internal rotation    Hip external rotation 4- 3+  Knee flexion 4 4  Knee extension 4 4-  Ankle dorsiflexion    Ankle plantarflexion    Ankle inversion    Ankle eversion     (Blank rows = not tested)  LUMBAR SPECIAL TESTS:  Straight leg raise test: Negative  FUNCTIONAL TESTS:  5x STS no hands 34.47 TUG 16.03   GAIT:  Comments: wide base of support, decreased gait speed, pelvic drop  TODAY'S TREATMENT:                                                                                                                              DATE: 11/13    PATIENT EDUCATION:   Education details: Educated patient on anatomy and physiology of current symptoms, prognosis, plan of care as well as initial self care strategies to promote recovery Person educated: Patient Education method: Explanation Education comprehension: verbalized understanding  HOME EXERCISE PROGRAM: Access Code: [email or web address] URL: [email or web address] Date: 07/31/2023 Prepared by: Lavinia Sharps  Exercises - Sit to Stand  - 1 x daily - 7 x weekly - 1 sets - 10 reps - Clamshell  - 1 x daily - 7 x weekly - 1 sets - 10 reps  ASSESSMENT:  CLINICAL IMPRESSION: Patient is a 77 y.o. female who was seen today for physical therapy evaluation and treatment for chronic left-sided low back pain with left sided sciatica. The patient would benefit from PT to address trunk and hip range of motion deficits, strength asymmetries in lumbo/pelvic and hip regions and pain levels that are currently affecting activities of daily living at home including sitting, sleeping, walking, and performing hobbies and recreational activities.     OBJECTIVE IMPAIRMENTS: decreased activity tolerance, difficulty walking, decreased ROM, decreased strength, impaired perceived functional ability, and pain.   ACTIVITY LIMITATIONS: sitting, sleeping, stairs, bed mobility, and locomotion level  PARTICIPATION LIMITATIONS: interpersonal relationship, community activity, and walking longer distances, spending time with  granddaughter  PERSONAL FACTORS: 3+ comorbidities: fibromyalgia, frequent falls, time since onset, tremors, HTN, multi jt OA  are also affecting patient's functional outcome.   REHAB POTENTIAL: Good  CLINICAL DECISION MAKING: Stable/uncomplicated  EVALUATION COMPLEXITY: Low   GOALS: Goals reviewed with patient? Yes  SHORT TERM GOALS: Target date: 08/28/2023    The patient will demonstrate knowledge of basic self care strategies and exercises to promote healing  Baseline: Goal status: INITIAL  2.   25% improvement in sleep reported /decreased back pain Baseline:  Goal status: INITIAL  3.  The patient will have improved hip strength to at least 4-/5 needed for standing, walking longer distances and descending stairs at home and in the community  Baseline:  Goal status: INITIAL  4.  Improved LE and trunk strength with greater ease with sit to stand 5x in <25 sec Baseline:  Goal status: INITIAL   LONG TERM GOALS: Target date: 09/25/2023    The patient will be independent in a safe self progression of a home exercise program to promote further recovery of function  Baseline:  Goal status: INITIAL  2.  50% improvement in sleep/ due to decreased back pain Baseline:  Goal status: INITIAL  3.  The patient will have improved hip strength to at least 4/5 needed for standing, walking longer distances and descending stairs at home and in the community  Baseline:  Goal status: INITIAL  4.  The patient will increase walking program from 2x/week to 3-4x/week Baseline:  Goal status: INITIAL  5.  The patient will have improved trunk flexor and extensor muscle strength needed for lifting medium weight objects such as grocery bags, laundry and luggage  Baseline:  Goal status: INITIAL    PLAN:  PT FREQUENCY: 2x/week  PT DURATION: 8 weeks  PLANNED INTERVENTIONS: 97164- PT Re-evaluation, 97110-Therapeutic exercises, 97530- Therapeutic activity, 97112- Neuromuscular re-education, 97535- Self Care, [id number]- Manual therapy, [id number]- Aquatic Therapy, 97014- Electrical stimulation (unattended), [id number]- Electrical stimulation (manual), [id number]- Ultrasound, [id number]- Traction (mechanical), [id number]- Ionotophoresis 4mg /ml Dexamethasone, Patient/Family education, Taping, Dry Needling, Joint mobilization, Spinal mobilization, Cryotherapy, and Moist heat.  PLAN FOR NEXT SESSION: check 6 MWT for baseline;  check initial HEP sit to stand and sidelying clams;  bil glute medius and max strengthening; lumbo/pelvic  trunk strengthening; Nu-step or bike  [place], [place] 07/31/23 7:03 PM Phone: [phone] Fax: [phone]

## 2023-08-02 ENCOUNTER — Ambulatory Visit: Payer: Medicare Other | Admitting: Physical Therapy

## 2023-08-02 DIAGNOSIS — G8929 Other chronic pain: Secondary | ICD-10-CM

## 2023-08-02 DIAGNOSIS — M6281 Muscle weakness (generalized): Secondary | ICD-10-CM

## 2023-08-02 DIAGNOSIS — M5442 Lumbago with sciatica, left side: Secondary | ICD-10-CM | POA: Diagnosis not present

## 2023-08-02 DIAGNOSIS — R262 Difficulty in walking, not elsewhere classified: Secondary | ICD-10-CM

## 2023-08-02 NOTE — Therapy (Signed)
OUTPATIENT PHYSICAL THERAPY THORACOLUMBAR EVALUATION   Patient Name: Mariah Snyder MRN: [id number] DOB:May 12, 1946, 77 y.o., female Today's Date: 08/02/2023  END OF SESSION:  PT End of Session - 08/02/23 0928     Visit Number 2    Date for PT Re-Evaluation 09/25/23    Authorization Type Medicare    Progress Note Due on Visit 10    PT Start Time 0930    PT Stop Time 1010    PT Time Calculation (min) 40 min    Activity Tolerance Patient tolerated treatment well             Past Medical History:  Diagnosis Date   Anemia    Anxiety    Arthritis    Asthma    Fibromyalgia    Gallstones    GERD (gastroesophageal reflux disease)    Hyperlipidemia    Hypertension    Hypothyroidism    Kidney stones    Peptic ulcer    Rectal prolapse    Sleep apnea    Urinary tract infection    Past Surgical History:  Procedure Laterality Date   bunyionectomy Left    CHOLECYSTECTOMY     ELBOW ARTHROSCOPY Left    LAPAROSCOPIC ASSISTED VAGINAL HYSTERECTOMY     SHOULDER ARTHROSCOPY Right    TONSILLECTOMY     Patient Active Problem List   Diagnosis Date Noted   Pain due to onychomycosis of toenails of both feet 07/15/2023   Constipation 09/14/2022   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/2021   Left hip pain 10/12/2021   High cholesterol 03/07/2021   Moderate persistent asthma without complication 11/01/2020   Bereavement 02/06/2019   Bilateral temporomandibular joint pain 02/18/2018   Right leg numbness 05/21/2017   Squamous blepharitis of upper and lower eyelids of both eyes 05/21/2017   Meibomian gland dysfunction (MGD) of upper and lower lids of both eyes 02/20/2017   Chronic left-sided low back pain with left-sided sciatica 12/31/2016   OAB (overactive bladder) 08/29/2015   Hallux limitus 01/24/2015   Balance problems 05/12/2013   Lichen sclerosus 05/12/2013   Healthcare maintenance 01/12/2013   Essential tremor 07/08/2012   Gastro-esophageal reflux disease with esophagitis  07/02/2011   Fibromyalgia 05/27/2011   Keratoconjunctivitis sicca 05/10/2011   Senile nuclear sclerosis 05/10/2011   Tear film insufficiency 05/10/2011   Essential (primary) hypertension 02/26/2011   Degenerative spinal arthritis 08/28/2000   Hashimoto's thyroiditis 08/28/1980    PCP: Hillard Danker MD  REFERRING PROVIDER: Hillard Danker MD  REFERRING DIAG: [phone], G89.29 chronic left sided low back pain with left sided sciatica  Rationale for Evaluation and Treatment: Rehabilitation  THERAPY DIAG:  Back pain; weakness ONSET DATE: 6 months  SUBJECTIVE:  SUBJECTIVE STATEMENT: A little sore this morning which is typical.  I've been having trouble doing the sidelying clams.  I did lots of sit to stands at the hospital volunteering yesterday    Eval: Had back problems for many years since 2001.  This recent injury happened when fell backwards onto butt 6 months ago, hit head.  Since that fall I can't sleep through the night.  Left SI flares up. Always had LE symptoms and takes Lyrica 20 years but trying to wean from that.  Leg cramps to feet mostly at night when not moving.  PERTINENT HISTORY:  Pelvic floor PT with Sallyanne Havers had DN Fibromyalgia (hard to get out of bed in the morning); history of HTN, arthritis, asthma, chronic constipation, hypothyroidism; sleep apnea, essential tremor being treated with Botox (1st round didn't work)  overactive bladder, hysterectomy, and cholecystectomy   Volunteers at the hospital  Able to walk 2 miles 2x/week  Pt states she has been "lazy" about ex in the past PAIN:   Are you having pain? Yes NPRS scale: 4/10  Pain location: left SI region Pain orientation: Left and Lower  PAIN TYPE: sharp Pain description: intermittent and sharp  Aggravating factors:  sitting long periods, lying on sofa on back; sleeping on left side, turning over in bed Relieving factors: lying prone pressing up with hands (learned in PT years ago and still does them 1x/week 20 reps; sitting shorter periods; walk around short distances  PRECAUTIONS: fall risk    WEIGHT BEARING RESTRICTIONS: No  FALLS:  Yes 3-4x in the last 6 months; fell 3 nights ago when she got up to go to the bathroom, toe slipped and hit back on the toilet, light bruise mid thoracic region left   LIVING ENVIRONMENT: Stairs: bedroom downstairs (avoids takes longer than it used to  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   PLOF: Independent  PATIENT GOALS: I hope the pain is gone;  I would like to walk more, ride a bicycle, sleep more  NEXT MD VISIT: call as needed  OBJECTIVE:  Note: Objective measures were completed at Evaluation unless otherwise noted.  DIAGNOSTIC FINDINGS:   11/02/2021  RI LUMBAR SPINE WITHOUT CONTRAST   TECHNIQUE: Multiplanar, multisequence MR imaging of the lumbar spine was performed. No intravenous contrast was administered.   COMPARISON:  X-ray lumbar 10/11/2021.   FINDINGS: Segmentation: Transitional lumbosacral vertebra numbered S1. This places hypoplastic ribs or unfused transverse processes at L1 by prior radiography.   Alignment:  Negative for listhesis or significant scoliosis   Vertebrae: No fracture, evidence of discitis, or aggressive bone lesion.   Conus medullaris and cauda equina: Conus extends to the L2 level. Conus and cauda equina appear normal.   Paraspinal and other soft tissues: Negative. 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   Disc levels:   T12- L1: Minor disc bulging   L1-L2: Mild disc bulging with small right paracentral protrusion   L2-L3:  Minor disc bulging   L3-L4: Mild disc bulging   L4-L5: Disc space narrowing and bulging with small right paracentral protrusion. Mild facet spurring   L5-S1:Disc narrowing and mild bulging.  Negative facets.   IMPRESSION: Ordinary lumbar spine degeneration with widely patent canal and foramina throughout the lumbar spine.   Transitional vertebra numbered S1.   PATIENT SURVEYS:  FOTO current score 69%, projected 68%   pt's self ratings of physical performance is higher than expected with goal score lower than current score   COGNITION: Overall cognitive status: Within functional limits for tasks assessed     MUSCLE LENGTH: Hamstrings: Right 65 deg; Left 60 deg Thomas test: Right 10 deg; Left 5 deg  POSTURE: decreased lumbar lordosis  PALPATION: No tender points identified today  LUMBAR ROM: able to do a partial squat to pick up small object from the floor  AROM eval  Flexion 70   Extension 20 painful  Right lateral flexion 15  Left lateral flexion 25  Right rotation   Left rotation    (Blank rows = not tested)  TRUNK STRENGTH:  Decreased activation of transverse abdominus muscles; abdominals 4-/5; decreased activation of lumbar multifidi; trunk extensors 4-/5  LOWER EXTREMITY ROM:   Grossly WFLS, some hip stiffness with internal and external rotation  LOWER EXTREMITY MMT:  Able to rise sit to stand without UE use Poor balance with SLS (needs UE support):  right/left pelvic drop indicating glute medius weakness left more than rigth   MMT Right eval Left eval  Hip flexion 4 4  Hip extension 4- 4-  Hip abduction 3+ 3-  Hip adduction    Hip internal rotation    Hip external rotation 4- 3+  Knee flexion 4 4  Knee extension 4 4-  Ankle dorsiflexion    Ankle plantarflexion    Ankle inversion    Ankle eversion     (Blank rows = not tested)  LUMBAR SPECIAL TESTS:  Straight leg raise test: Negative  FUNCTIONAL TESTS:  5x STS no hands 34.47 TUG 16.03 3 mwt  598 WIDE BASE OF SUPPORT,HEAVIER LEFT STRIKE  GAIT:  Comments: wide base of support, decreased gait speed, pelvic drop  TODAY'S TREATMENT:                                                                                                                              DATE: 11/15  Nu-Step seat 7 blue machine L3 5 min Supine clam (changed HEP to this position instead of sidelying secondary to pain) red band 10x, single leg 10x right/left given red band for home Supine abdominal draw in 8x Supine hand to knee isometric 8x alternating (added to HEP) Supine bent knee lift and lowers but difficulty maintaining core activation and therefore increases LBP so discontinued at 4 reps Seated core series holding 5# KB: 5 reps each: hip to hip, shoulder to hip each side, Vs, ear to ear (added HEP) Standing red band rows 10x 3 MWT pt reports decreased back pain following    PATIENT EDUCATION:  Education details: Educated patient on anatomy and physiology of current symptoms, prognosis, plan of care as well as initial self care strategies to promote recovery Person educated: Patient Education method: Explanation Education comprehension: verbalized understanding  HOME EXERCISE PROGRAM: Access Code: [email or web address] URL: [email or web address] Date: 08/02/2023 Prepared  by: Lavinia Sharps  Exercises - Sit to Stand  - 1 x daily - 7 x weekly - 1 sets - 10 reps - Hooklying Clamshell with Resistance  - 1 x daily - 7 x weekly - 1 sets - 10 reps - Hooklying Isometric Hip Flexion with Opposite Arm  - 1 x daily - 7 x weekly - 1 sets - 10 reps - Seated Diagonal Chop with Medicine Ball  - 1 x daily - 7 x weekly - 4 sets - 5 reps - Standing Row with Anchored Resistance  - 1 x daily - 7 x weekly - 1 sets - 10 reps  ASSESSMENT:  CLINICAL IMPRESSION: Modified ex's based on pain response.  Verbal and tactile cues for proper recruitment of transverse abdominus muscles without compensatory strategy of holding  breath.  Instructed in frequent performance of muscle activation of these core muscles for improved functional mobility and reduction in back pain.  Noted wider base of support with ambulation and a heavier foot strike on the left.  She does report decreased pain after walking.     OBJECTIVE IMPAIRMENTS: decreased activity tolerance, difficulty walking, decreased ROM, decreased strength, impaired perceived functional ability, and pain.   ACTIVITY LIMITATIONS: sitting, sleeping, stairs, bed mobility, and locomotion level  PARTICIPATION LIMITATIONS: interpersonal relationship, community activity, and walking longer distances, spending time with granddaughter  PERSONAL FACTORS: 3+ comorbidities: fibromyalgia, frequent falls, time since onset, tremors, HTN, multi jt OA  are also affecting patient's functional outcome.   REHAB POTENTIAL: Good  CLINICAL DECISION MAKING: Stable/uncomplicated  EVALUATION COMPLEXITY: Low   GOALS: Goals reviewed with patient? Yes  SHORT TERM GOALS: Target date: 08/28/2023    The patient will demonstrate knowledge of basic self care strategies and exercises to promote healing  Baseline: Goal status: INITIAL  2.  25% improvement in sleep reported /decreased back pain Baseline:  Goal status: INITIAL  3.  The patient will have improved hip strength to at least 4-/5 needed for standing, walking longer distances and descending stairs at home and in the community  Baseline:  Goal status: INITIAL  4.  Improved LE and trunk strength with greater ease with sit to stand 5x in <25 sec Baseline:  Goal status: INITIAL   LONG TERM GOALS: Target date: 09/25/2023    The patient will be independent in a safe self progression of a home exercise program to promote further recovery of function  Baseline:  Goal status: INITIAL  2.  50% improvement in sleep/ due to decreased back pain Baseline:  Goal status: INITIAL  3.  The patient will have improved hip strength  to at least 4/5 needed for standing, walking longer distances and descending stairs at home and in the community  Baseline:  Goal status: INITIAL  4.  The patient will increase walking program from 2x/week to 3-4x/week Baseline:  Goal status: INITIAL  5.  The patient will have improved trunk flexor and extensor muscle strength needed for lifting medium weight objects such as grocery bags, laundry and luggage  Baseline:  Goal status: INITIAL    PLAN:  PT FREQUENCY: 2x/week  PT DURATION: 8 weeks  PLANNED INTERVENTIONS: 97164- PT Re-evaluation, 97110-Therapeutic exercises, 97530- Therapeutic activity, 97112- Neuromuscular re-education, 97535- Self Care, 69629- Manual therapy, [id number]- Aquatic Therapy, 97014- Electrical stimulation (unattended), [id number]- Electrical stimulation (manual), [id number]- Ultrasound, [id number]- Traction (mechanical), [id number]- Ionotophoresis 4mg /ml Dexamethasone, Patient/Family education, Taping, Dry Needling, Joint mobilization, Spinal mobilization, Cryotherapy, and Moist heat.  PLAN FOR NEXT SESSION: assess response to HEP;  bil glute medius and max strengthening; lumbo/pelvic trunk strengthening; Nu-step or bike  [place], [place] 08/02/23 10:56 AM Phone: [phone] Fax: [phone]

## 2023-08-07 ENCOUNTER — Encounter: Payer: Self-pay | Admitting: Physical Therapy

## 2023-08-07 ENCOUNTER — Ambulatory Visit: Payer: Medicare Other | Admitting: Physical Therapy

## 2023-08-07 ENCOUNTER — Ambulatory Visit: Payer: Medicare Other

## 2023-08-07 DIAGNOSIS — M5442 Lumbago with sciatica, left side: Secondary | ICD-10-CM | POA: Diagnosis not present

## 2023-08-07 DIAGNOSIS — M6281 Muscle weakness (generalized): Secondary | ICD-10-CM

## 2023-08-07 DIAGNOSIS — R262 Difficulty in walking, not elsewhere classified: Secondary | ICD-10-CM

## 2023-08-07 DIAGNOSIS — G8929 Other chronic pain: Secondary | ICD-10-CM

## 2023-08-07 NOTE — Therapy (Signed)
OUTPATIENT PHYSICAL THERAPY THORACOLUMBAR TREATMENT   Patient Name: Mariah Snyder MRN: [id number] DOB:[birth year], 77 y.o., female Today's Date: 08/07/2023  END OF SESSION:  PT End of Session - 08/07/23 1015     Visit Number 3    Date for PT Re-Evaluation 09/25/23    Authorization Type Medicare    Progress Note Due on Visit 10    PT Start Time 0933    PT Stop Time 1015    PT Time Calculation (min) 42 min    Activity Tolerance Patient tolerated treatment well    Behavior During Therapy WFL for tasks assessed/performed              Past Medical History:  Diagnosis Date   Anemia    Anxiety    Arthritis    Asthma    Fibromyalgia    Gallstones    GERD (gastroesophageal reflux disease)    Hyperlipidemia    Hypertension    Hypothyroidism    Kidney stones    Peptic ulcer    Rectal prolapse    Sleep apnea    Urinary tract infection    Past Surgical History:  Procedure Laterality Date   bunyionectomy Left    CHOLECYSTECTOMY     ELBOW ARTHROSCOPY Left    LAPAROSCOPIC ASSISTED VAGINAL HYSTERECTOMY     SHOULDER ARTHROSCOPY Right    TONSILLECTOMY     Patient Active Problem List   Diagnosis Date Noted   Pain due to onychomycosis of toenails of both feet 07/15/2023   Constipation 09/14/2022   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/2021   Left hip pain 10/12/2021   High cholesterol 03/07/2021   Moderate persistent asthma without complication 11/01/2020   Bereavement 02/06/2019   Bilateral temporomandibular joint pain 02/18/2018   Right leg numbness 05/21/2017   Squamous blepharitis of upper and lower eyelids of both eyes 05/21/2017   Meibomian gland dysfunction (MGD) of upper and lower lids of both eyes 02/20/2017   Chronic left-sided low back pain with left-sided sciatica 12/31/2016   OAB (overactive bladder) 08/29/2015   Hallux limitus 01/24/2015   Balance problems 05/12/2013   Lichen sclerosus 05/12/2013   Healthcare maintenance 01/12/2013   Essential tremor  07/08/2012   Gastro-esophageal reflux disease with esophagitis 07/02/2011   Fibromyalgia 05/27/2011   Keratoconjunctivitis sicca 05/10/2011   Senile nuclear sclerosis 05/10/2011   Tear film insufficiency 05/10/2011   Essential (primary) hypertension 02/26/2011   Degenerative spinal arthritis 08/28/2000   Hashimoto's thyroiditis 08/28/1980    PCP: Hillard Danker MD  REFERRING PROVIDER: Hillard Danker MD  REFERRING DIAG: [phone], G89.29 chronic left sided low back pain with left sided sciatica  Rationale for Evaluation and Treatment: Rehabilitation  THERAPY DIAG:  Back pain; weakness ONSET DATE: 6 months  SUBJECTIVE:  SUBJECTIVE STATEMENT: Patient reports her back is very painful today. She reports 5/10 pain. After doing HEP it flared her back up.  Eval: Had back problems for many years since 2001.  This recent injury happened when fell backwards onto butt 6 months ago, hit head.  Since that fall I can't sleep through the night.  Left SI flares up. Always had LE symptoms and takes Lyrica 20 years but trying to wean from that.  Leg cramps to feet mostly at night when not moving.  PERTINENT HISTORY:  Pelvic floor PT with Mariah Snyder had DN Fibromyalgia (hard to get out of bed in the morning); history of HTN, arthritis, asthma, chronic constipation, hypothyroidism; sleep apnea, essential tremor being treated with Botox (1st round didn't work)  overactive bladder, hysterectomy, and cholecystectomy   Volunteers at the hospital  Able to walk 2 miles 2x/week  Pt states she has been "lazy" about ex in the past PAIN:   Are you having pain? Yes NPRS scale: 4/10  Pain location: left SI region Pain orientation: Left and Lower  PAIN TYPE: sharp Pain description: intermittent and sharp  Aggravating  factors: sitting long periods, lying on sofa on back; sleeping on left side, turning over in bed Relieving factors: lying prone pressing up with hands (learned in PT years ago and still does them 1x/week 20 reps; sitting shorter periods; walk around short distances  PRECAUTIONS: fall risk    WEIGHT BEARING RESTRICTIONS: No  FALLS:  Yes 3-4x in the last 6 months; fell 3 nights ago when she got up to go to the bathroom, toe slipped and hit back on the toilet, light bruise mid thoracic region left   LIVING ENVIRONMENT: Stairs: bedroom downstairs (avoids takes longer than it used to  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   PLOF: Independent  PATIENT GOALS: I hope the pain is gone;  I would like to walk more, ride a bicycle, sleep more  NEXT MD VISIT: call as needed  OBJECTIVE:  Note: Objective measures were completed at Evaluation unless otherwise noted.  DIAGNOSTIC FINDINGS:   11/02/2021  RI LUMBAR SPINE WITHOUT CONTRAST   TECHNIQUE: Multiplanar, multisequence MR imaging of the lumbar spine was performed. No intravenous contrast was administered.   COMPARISON:  X-ray lumbar 10/11/2021.   FINDINGS: Segmentation: Transitional lumbosacral vertebra numbered S1. This places hypoplastic ribs or unfused transverse processes at L1 by prior radiography.   Alignment:  Negative for listhesis or significant scoliosis   Vertebrae: No fracture, evidence of discitis, or aggressive bone lesion.   Conus medullaris and cauda equina: Conus extends to the L2 level. Conus and cauda equina appear normal.   Paraspinal and other soft tissues: Negative. 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   Disc levels:   T12- L1: Minor disc bulging   L1-L2: Mild disc bulging with small right paracentral protrusion    L2-L3: Minor disc bulging   L3-L4: Mild disc bulging   L4-L5: Disc space narrowing and bulging with small right paracentral protrusion. Mild facet spurring   L5-S1:Disc narrowing and mild bulging.  Negative facets.   IMPRESSION: Ordinary lumbar spine degeneration with widely patent canal and foramina throughout the lumbar spine.   Transitional vertebra numbered S1.   PATIENT SURVEYS:  FOTO current score 69%, projected 68%   pt's self ratings of  physical performance is higher than expected with goal score lower than current score   COGNITION: Overall cognitive status: Within functional limits for tasks assessed     MUSCLE LENGTH: Hamstrings: Right 65 deg; Left 60 deg Thomas test: Right 10 deg; Left 5 deg  POSTURE: decreased lumbar lordosis  PALPATION: No tender points identified today  LUMBAR ROM: able to do a partial squat to pick up small object from the floor  AROM eval  Flexion 70   Extension 20 painful  Right lateral flexion 15  Left lateral flexion 25  Right rotation   Left rotation    (Blank rows = not tested)  TRUNK STRENGTH:  Decreased activation of transverse abdominus muscles; abdominals 4-/5; decreased activation of lumbar multifidi; trunk extensors 4-/5  LOWER EXTREMITY ROM:   Grossly WFLS, some hip stiffness with internal and external rotation  LOWER EXTREMITY MMT:  Able to rise sit to stand without UE use Poor balance with SLS (needs UE support):  right/left pelvic drop indicating glute medius weakness left more than rigth   MMT Right eval Left eval  Hip flexion 4 4  Hip extension 4- 4-  Hip abduction 3+ 3-  Hip adduction    Hip internal rotation    Hip external rotation 4- 3+  Knee flexion 4 4  Knee extension 4 4-  Ankle dorsiflexion    Ankle plantarflexion    Ankle inversion    Ankle eversion     (Blank rows = not tested)  LUMBAR SPECIAL TESTS:  Straight leg raise test: Negative  FUNCTIONAL TESTS:  5x STS no hands 34.47 TUG  16.03 3 mwt 598 WIDE BASE OF SUPPORT,HEAVIER LEFT STRIKE  GAIT:  Comments: wide base of support, decreased gait speed, pelvic drop  TODAY'S TREATMENT:                                                                                                                              DATE:  11/20  Nu-Step seat 7 blue machine L3 5 min Reviewed HEP- v/c for form correct with seated chops & hook lying abduction 3 way SB stretch x 8 each direction Supine LTR x 10 each direction Supine TA activation x 10 - verbal and tactile cues for correct performance Supine hand to knee isometric x10 bilateral  alternating  Bent Knee Fallout x 10 Seated trunk extension with blue long band 2 x 10 Standing shoulder rows & extension red TB 2 x 10  Seated piriformis stretch  x 30 sec bilateral    11/15  Nu-Step seat 7 blue machine L3 5 min Supine clam (changed HEP to this position instead of sidelying secondary to pain) red band 10x, single leg 10x right/left given red band for home Supine abdominal draw in 8x Supine hand to knee isometric 8x alternating (added to HEP) Supine bent knee lift and lowers but difficulty maintaining core activation and therefore increases LBP so discontinued at 4 reps Seated core series  holding 5# KB: 5 reps each: hip to hip, shoulder to hip each side, Vs, ear to ear (added HEP) Standing red band rows 10x 3 MWT pt reports decreased back pain following    PATIENT EDUCATION:  Education details: Educated patient on anatomy and physiology of current symptoms, prognosis, plan of care as well as initial self care strategies to promote recovery Person educated: Patient Education method: Explanation Education comprehension: verbalized understanding  HOME EXERCISE PROGRAM: Access Code: [email or web address] URL: [email or web address] Date: 08/02/2023 Prepared by: Lavinia Sharps  Exercises - Sit to Stand  - 1 x daily - 7 x weekly - 1 sets - 10 reps - Hooklying Clamshell with  Resistance  - 1 x daily - 7 x weekly - 1 sets - 10 reps - Hooklying Isometric Hip Flexion with Opposite Arm  - 1 x daily - 7 x weekly - 1 sets - 10 reps - Seated Diagonal Chop with Medicine Ball  - 1 x daily - 7 x weekly - 4 sets - 5 reps - Standing Row with Anchored Resistance  - 1 x daily - 7 x weekly - 1 sets - 10 reps  ASSESSMENT:  CLINICAL IMPRESSION: Cortlynn Nagi present to therapy very painful today. She feels her home exercises flared her back pain over the weekend. Reviewed patient's HEP and provided verbal cues for form correct with seated chops and hook lying clams. Decreased patient's ROM for hooklying clams and that eliminated her back pain she was experiencing. Today's treatment session focused on lumbar mobility & core strengthening. Patient required verbal and tactile cues for correct TA activation. Patient will benefit from skilled PT to address the below impairments and improve overall function.   OBJECTIVE IMPAIRMENTS: decreased activity tolerance, difficulty walking, decreased ROM, decreased strength, impaired perceived functional ability, and pain.   ACTIVITY LIMITATIONS: sitting, sleeping, stairs, bed mobility, and locomotion level  PARTICIPATION LIMITATIONS: interpersonal relationship, community activity, and walking longer distances, spending time with granddaughter  PERSONAL FACTORS: 3+ comorbidities: fibromyalgia, frequent falls, time since onset, tremors, HTN, multi jt OA  are also affecting patient's functional outcome.   REHAB POTENTIAL: Good  CLINICAL DECISION MAKING: Stable/uncomplicated  EVALUATION COMPLEXITY: Low   GOALS: Goals reviewed with patient? Yes  SHORT TERM GOALS: Target date: 08/28/2023    The patient will demonstrate knowledge of basic self care strategies and exercises to promote healing  Baseline: Goal status: INITIAL  2.  25% improvement in sleep reported /decreased back pain Baseline:  Goal status: INITIAL  3.  The patient will have  improved hip strength to at least 4-/5 needed for standing, walking longer distances and descending stairs at home and in the community  Baseline:  Goal status: INITIAL  4.  Improved LE and trunk strength with greater ease with sit to stand 5x in <25 sec Baseline:  Goal status: INITIAL   LONG TERM GOALS: Target date: 09/25/2023    The patient will be independent in a safe self progression of a home exercise program to promote further recovery of function  Baseline:  Goal status: INITIAL  2.  50% improvement in sleep/ due to decreased back pain Baseline:  Goal status: INITIAL  3.  The patient will have improved hip strength to at least 4/5 needed for standing, walking longer distances and descending stairs at home and in the community  Baseline:  Goal status: INITIAL  4.  The patient will increase walking program from 2x/week to 3-4x/week Baseline:  Goal status: INITIAL  5.  The patient will  have improved trunk flexor and extensor muscle strength needed for lifting medium weight objects such as grocery bags, laundry and luggage  Baseline:  Goal status: INITIAL    PLAN:  PT FREQUENCY: 2x/week  PT DURATION: 8 weeks  PLANNED INTERVENTIONS: [id number]- PT Re-evaluation, 97110-Therapeutic exercises, 97530- Therapeutic activity, 97112- Neuromuscular re-education, 97535- Self Care, 16109- Manual therapy, [id number]- Aquatic Therapy, 97014- Electrical stimulation (unattended), [id number]- Electrical stimulation (manual), [id number]- Ultrasound, [id number]- Traction (mechanical), [id number]- Ionotophoresis 4mg /ml Dexamethasone, Patient/Family education, Taping, Dry Needling, Joint mobilization, Spinal mobilization, Cryotherapy, and Moist heat.  PLAN FOR NEXT SESSION: assess response to HEP with modification; continue hip & core strengthening    Claude Manges, PT 08/07/23 10:16 AM

## 2023-08-20 ENCOUNTER — Ambulatory Visit: Payer: Medicare Other | Attending: Internal Medicine | Admitting: Physical Therapy

## 2023-08-20 DIAGNOSIS — R262 Difficulty in walking, not elsewhere classified: Secondary | ICD-10-CM | POA: Diagnosis present

## 2023-08-20 DIAGNOSIS — R293 Abnormal posture: Secondary | ICD-10-CM | POA: Diagnosis present

## 2023-08-20 DIAGNOSIS — M5442 Lumbago with sciatica, left side: Secondary | ICD-10-CM | POA: Diagnosis present

## 2023-08-20 DIAGNOSIS — G8929 Other chronic pain: Secondary | ICD-10-CM | POA: Insufficient documentation

## 2023-08-20 DIAGNOSIS — M6281 Muscle weakness (generalized): Secondary | ICD-10-CM | POA: Insufficient documentation

## 2023-08-20 DIAGNOSIS — R279 Unspecified lack of coordination: Secondary | ICD-10-CM | POA: Insufficient documentation

## 2023-08-20 NOTE — Therapy (Signed)
OUTPATIENT PHYSICAL THERAPY THORACOLUMBAR TREATMENT   Patient Name: Mariah Snyder MRN: [id number] DOB:July 24, 1946, 77 y.o., female Today's Date: 08/20/2023  END OF SESSION:  PT End of Session - 08/20/23 1233     Visit Number 4    Date for PT Re-Evaluation 09/25/23    Authorization Type Medicare    Progress Note Due on Visit 10    PT Start Time 1231    PT Stop Time 1315    PT Time Calculation (min) 44 min    Activity Tolerance Patient tolerated treatment well              Past Medical History:  Diagnosis Date   Anemia    Anxiety    Arthritis    Asthma    Fibromyalgia    Gallstones    GERD (gastroesophageal reflux disease)    Hyperlipidemia    Hypertension    Hypothyroidism    Kidney stones    Peptic ulcer    Rectal prolapse    Sleep apnea    Urinary tract infection    Past Surgical History:  Procedure Laterality Date   bunyionectomy Left    CHOLECYSTECTOMY     ELBOW ARTHROSCOPY Left    LAPAROSCOPIC ASSISTED VAGINAL HYSTERECTOMY     SHOULDER ARTHROSCOPY Right    TONSILLECTOMY     Patient Active Problem List   Diagnosis Date Noted   Pain due to onychomycosis of toenails of both feet 07/15/2023   Constipation 09/14/2022   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/2021   Left hip pain 10/12/2021   High cholesterol 03/07/2021   Moderate persistent asthma without complication 11/01/2020   Bereavement 02/06/2019   Bilateral temporomandibular joint pain 02/18/2018   Right leg numbness 05/21/2017   Squamous blepharitis of upper and lower eyelids of both eyes 05/21/2017   Meibomian gland dysfunction (MGD) of upper and lower lids of both eyes 02/20/2017   Chronic left-sided low back pain with left-sided sciatica 12/31/2016   OAB (overactive bladder) 08/29/2015   Hallux limitus 01/24/2015   Balance problems 05/12/2013   Lichen sclerosus 05/12/2013   Healthcare maintenance 01/12/2013   Essential tremor 07/08/2012   Gastro-esophageal reflux disease with esophagitis  07/02/2011   Fibromyalgia 05/27/2011   Keratoconjunctivitis sicca 05/10/2011   Senile nuclear sclerosis 05/10/2011   Tear film insufficiency 05/10/2011   Essential (primary) hypertension 02/26/2011   Degenerative spinal arthritis 08/28/2000   Hashimoto's thyroiditis 08/28/1980    PCP: Hillard Danker MD  REFERRING PROVIDER: Hillard Danker MD  REFERRING DIAG: [phone], G89.29 chronic left sided low back pain with left sided sciatica  Rationale for Evaluation and Treatment: Rehabilitation  THERAPY DIAG:  Back pain; weakness ONSET DATE: 6 months  SUBJECTIVE:  SUBJECTIVE STATEMENT: I'm discouraged with the exercise.  It just hurts to do them.  I pay for it later.  I've also been going up and down the steps a lot to get my Christmas decorations.  Eval: Had back problems for many years since 2001.  This recent injury happened when fell backwards onto butt 6 months ago, hit head.  Since that fall I can't sleep through the night.  Left SI flares up. Always had LE symptoms and takes Lyrica 20 years but trying to wean from that.  Leg cramps to feet mostly at night when not moving.  PERTINENT HISTORY:  Pelvic floor PT with Sallyanne Havers had DN Fibromyalgia (hard to get out of bed in the morning); history of HTN, arthritis, asthma, chronic constipation, hypothyroidism; sleep apnea, essential tremor being treated with Botox (1st round didn't work)  overactive bladder, hysterectomy, and cholecystectomy   Volunteers at the hospital  Able to walk 2 miles 2x/week  Pt states she has been "lazy" about ex in the past PAIN:   Are you having pain? Yes NPRS scale: 4/10  Pain location: left SI region Pain orientation: Left and Lower  PAIN TYPE: sharp Pain description: intermittent and sharp  Aggravating factors:  sitting long periods, lying on sofa on back; sleeping on left side, turning over in bed Relieving factors: lying prone pressing up with hands (learned in PT years ago and still does them 1x/week 20 reps; sitting shorter periods; walk around short distances  PRECAUTIONS: fall risk    WEIGHT BEARING RESTRICTIONS: No  FALLS:  Yes 3-4x in the last 6 months; fell 3 nights ago when she got up to go to the bathroom, toe slipped and hit back on the toilet, light bruise mid thoracic region left   LIVING ENVIRONMENT: Stairs: bedroom downstairs (avoids takes longer than it used to  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   PLOF: Independent  PATIENT GOALS: I hope the pain is gone;  I would like to walk more, ride a bicycle, sleep more  NEXT MD VISIT: call as needed  OBJECTIVE:  Note: Objective measures were completed at Evaluation unless otherwise noted.  DIAGNOSTIC FINDINGS:   11/02/2021  RI LUMBAR SPINE WITHOUT CONTRAST   TECHNIQUE: Multiplanar, multisequence MR imaging of the lumbar spine was performed. No intravenous contrast was administered.   COMPARISON:  X-ray lumbar 10/11/2021.   FINDINGS: Segmentation: Transitional lumbosacral vertebra numbered S1. This places hypoplastic ribs or unfused transverse processes at L1 by prior radiography.   Alignment:  Negative for listhesis or significant scoliosis   Vertebrae: No fracture, evidence of discitis, or aggressive bone lesion.   Conus medullaris and cauda equina: Conus extends to the L2 level. Conus and cauda equina appear normal.   Paraspinal and other soft tissues: Negative. 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   Disc levels:   T12- L1: Minor disc bulging   L1-L2: Mild disc bulging with small right paracentral protrusion   L2-L3:  Minor disc bulging   L3-L4: Mild disc bulging   L4-L5: Disc space narrowing and bulging with small right paracentral protrusion. Mild facet spurring   L5-S1:Disc narrowing and mild bulging.  Negative facets.   IMPRESSION: Ordinary lumbar spine degeneration with widely patent canal and foramina throughout the lumbar spine.   Transitional vertebra numbered S1.  PATIENT SURVEYS:  FOTO current score 69%, projected 68%   pt's self ratings of physical performance is higher than expected with goal score lower than current score   COGNITION: Overall cognitive status: Within functional limits for tasks assessed     MUSCLE LENGTH: Hamstrings: Right 65 deg; Left 60 deg Thomas test: Right 10 deg; Left 5 deg  POSTURE: decreased lumbar lordosis  PALPATION: No tender points identified today  LUMBAR ROM: able to do a partial squat to pick up small object from the floor  AROM eval  Flexion 70   Extension 20 painful  Right lateral flexion 15  Left lateral flexion 25  Right rotation   Left rotation    (Blank rows = not tested)  TRUNK STRENGTH:  Decreased activation of transverse abdominus muscles; abdominals 4-/5; decreased activation of lumbar multifidi; trunk extensors 4-/5  LOWER EXTREMITY ROM:   Grossly WFLS, some hip stiffness with internal and external rotation  LOWER EXTREMITY MMT:  Able to rise sit to stand without UE use Poor balance with SLS (needs UE support):  right/left pelvic drop indicating glute medius weakness left more than rigth   MMT Right eval Left eval  Hip flexion 4 4  Hip extension 4- 4-  Hip abduction 3+ 3-  Hip adduction    Hip internal rotation    Hip external rotation 4- 3+  Knee flexion 4 4  Knee extension 4 4-  Ankle dorsiflexion    Ankle plantarflexion    Ankle inversion    Ankle eversion     (Blank rows = not tested)  LUMBAR SPECIAL TESTS:  Straight leg raise test: Negative  FUNCTIONAL TESTS:  5x STS no hands 34.47 TUG 16.03 3 mwt  598 WIDE BASE OF SUPPORT,HEAVIER LEFT STRIKE  GAIT:  Comments: wide base of support, decreased gait speed, pelvic drop  TODAY'S TREATMENT:                                                                                                                              DATE:  12/3: Nu-Step seat 7 blue machine L1 6 min Neuroscience of pain education: using meditation and other strategies to dec CNS sensitization Reviewed HEP and discussion of decreasing number of reps to 5 (reduced volume overall) Supine hand to knee isometric push 5x Supine red band clams 5x Seated 4# yellow plyo ball: hip to hip 5x, Vs (shoulder to shoulder) 5x Standing shoulder rows red band 5x  Standing extension red band 5x  Supine heat to lumbar with deep breathing/mindfulness 5 min Discussion of trial of aquatic ex pt given info- will try this Friday (appt moved from clinic to pool)  11/20  Nu-Step seat 7 blue machine L3 5 min Reviewed HEP- v/c for form correct with seated chops & hook lying abduction 3 way SB stretch x 8 each direction Supine LTR x 10 each direction Supine TA activation x 10 - verbal and tactile cues for correct performance Supine  hand to knee isometric x110 bilateral  alternating  Bent Knee Fallout x 10 Seated trunk extension with blue long band 2 x 10 Standing shoulder rows & extension red TB 2 x 10  Seated piriformis stretch  x 30 sec bilateral    11/15  Nu-Step seat 7 blue machine L3 5 min Supine clam (changed HEP to this position instead of sidelying secondary to pain) red band 10x, single leg 10x right/left given red band for home Supine abdominal draw in 8x Supine hand to knee isometric 8x alternating (added to HEP) Supine bent knee lift and lowers but difficulty maintaining core activation and therefore increases LBP so discontinued at 4 reps Seated core series holding 5# KB: 5 reps each: hip to hip, shoulder to hip each side, Vs, ear to ear (added HEP) Standing red band rows 10x 3  MWT pt reports decreased back pain following    PATIENT EDUCATION:  Education details: Educated patient on anatomy and physiology of current symptoms, prognosis, plan of care as well as initial self care strategies to promote recovery Person educated: Patient Education method: Explanation Education comprehension: verbalized understanding  HOME EXERCISE PROGRAM: Access Code: [email or web address] URL: [email or web address] Date: 08/02/2023 Prepared by: Lavinia Sharps 12/3:  Limit to 5 reps with HEP secondary to flare ups following ex Exercises - Sit to Stand  - 1 x daily - 7 x weekly - 1 sets - 10 reps - Hooklying Clamshell with Resistance  - 1 x daily - 7 x weekly - 1 sets - 10 reps - Hooklying Isometric Hip Flexion with Opposite Arm  - 1 x daily - 7 x weekly - 1 sets - 10 reps - Seated Diagonal Chop with Medicine Ball  - 1 x daily - 7 x weekly - 4 sets - 5 reps - Standing Row with Anchored Resistance  - 1 x daily - 7 x weekly - 1 sets - 10 reps  ASSESSMENT:  CLINICAL IMPRESSION: Treatment modified extensively secondary to ongoing report of delayed onset of severe pain following exercise consistent with fibromyalgia flares.   Scaled ex's with HEP to a limit of 5 reps and discussion of holding off on ex's on days where her activity level is already high [employer]).  Discussed pain education and central desensitization strategies.  Recommend trial of aquatic ex secondary to low tolerance to land based ex.   OBJECTIVE IMPAIRMENTS: decreased activity tolerance, difficulty walking, decreased ROM, decreased strength, impaired perceived functional ability, and pain.   ACTIVITY LIMITATIONS: sitting, sleeping, stairs, bed mobility, and locomotion level  PARTICIPATION LIMITATIONS: interpersonal relationship, community activity, and walking longer distances, spending time with granddaughter  PERSONAL FACTORS: 3+ comorbidities: fibromyalgia, frequent falls, time since  onset, tremors, HTN, multi jt OA  are also affecting patient's functional outcome.   REHAB POTENTIAL: Good  CLINICAL DECISION MAKING: Stable/uncomplicated  EVALUATION COMPLEXITY: Low   GOALS: Goals reviewed with patient? Yes  SHORT TERM GOALS: Target date: 08/28/2023    The patient will demonstrate knowledge of basic self care strategies and exercises to promote healing  Baseline: Goal status: INITIAL  2.  25% improvement in sleep reported /decreased back pain Baseline:  Goal status: INITIAL  3.  The patient will have improved hip strength to at least 4-/5 needed for standing, walking longer distances and descending stairs at home and in the community  Baseline:  Goal status: INITIAL  4.  Improved LE and trunk strength with greater ease with sit to stand 5x in <25 sec Baseline:  Goal status: INITIAL   LONG TERM GOALS: Target date: 09/25/2023    The patient will be independent in a safe self progression of a home exercise program to promote further recovery of function  Baseline:  Goal status: INITIAL  2.  50% improvement in sleep/ due to decreased back pain Baseline:  Goal status: INITIAL  3.  The patient will have improved hip strength to at least 4/5 needed for standing, walking longer distances and descending stairs at home and in the community  Baseline:  Goal status: INITIAL  4.  The patient will increase walking program from 2x/week to 3-4x/week Baseline:  Goal status: INITIAL  5.  The patient will have improved trunk flexor and extensor muscle strength needed for lifting medium weight objects such as grocery bags, laundry and luggage  Baseline:  Goal status: INITIAL    PLAN:  PT FREQUENCY: 2x/week  PT DURATION: 8 weeks  PLANNED INTERVENTIONS: 97164- PT Re-evaluation, 97110-Therapeutic exercises, 97530- Therapeutic activity, 97112- Neuromuscular re-education, 97535- Self Care, 30865- Manual therapy, [id number]- Aquatic Therapy, 97014- Electrical  stimulation (unattended), [id number]- Electrical stimulation (manual), [id number]- Ultrasound, [id number]- Traction (mechanical), [id number]- Ionotophoresis 4mg /ml Dexamethasone, Patient/Family education, Taping, Dry Needling, Joint mobilization, Spinal mobilization, Cryotherapy, and Moist heat.  PLAN FOR NEXT SESSION: trial of aquatic PT secondary to pain sensitivity with exercise (fibromyalgia);  recommend light aquatic ex for short duration;  graded exposure to ex (low reps); assess response to HEP with modification; continue low level hip & core strengthening    Lavinia Sharps, PT 08/20/23 1:21 PM Phone: [phone] Fax: [phone]

## 2023-08-20 NOTE — Patient Instructions (Signed)
     [facility] Welcome to [facility]! Here you will find all the information you will need regarding your pool therapy. If you have further questions at any time, please call our office at [phone]. After completing your initial evaluation in the [facility], you may be eligible to complete a portion of your therapy in the pool. A typical week of therapy will consist of 1-2 typical physical therapy visits at our [facility] location and an additional session of therapy in the pool located at the [facility] at [address]. [address], [place] [address]. The phone number at the pool site is [phone]. Please call this number if you are running late or need to cancel your appointment.  Aquatic therapy will be offered on Wednesday mornings and Friday afternoons. Each session will last approximately 45 minutes. All scheduling and payments for aquatic therapy sessions, including cancelations, will be done through our [facility] location.  To be eligible for aquatic therapy, these criteria must be met: You must be able to independently change in the locker room and get to the pool deck. A caregiver can come with you to help if needed. There are benches for a caregiver to sit on next to the pool. No one with an open wound is permitted in the pool.  Handicap parking is available in the front and there is a drop off option for even closer accessibility. Please arrive 15 minutes prior to your appointment to prepare for your pool session. You must sign in at the front desk upon your arrival. Please be sure to attend to any toileting needs prior to entering the pool. Locker rooms for changing are available.  There is direct access to the pool deck from the locker room. You can lock your belongings in a locker or bring them with you poolside. Your therapist will greet you on the pool deck. There may be other swimmers in the pool at the  same time but your session is one-on-one with the therapist.   

## 2023-08-23 ENCOUNTER — Encounter: Payer: Medicare Other | Admitting: Physical Therapy

## 2023-08-23 ENCOUNTER — Encounter: Payer: Self-pay | Admitting: Physical Therapy

## 2023-08-23 ENCOUNTER — Ambulatory Visit: Payer: Medicare Other | Admitting: Physical Therapy

## 2023-08-23 DIAGNOSIS — R262 Difficulty in walking, not elsewhere classified: Secondary | ICD-10-CM

## 2023-08-23 DIAGNOSIS — G8929 Other chronic pain: Secondary | ICD-10-CM

## 2023-08-23 DIAGNOSIS — M5442 Lumbago with sciatica, left side: Secondary | ICD-10-CM | POA: Diagnosis not present

## 2023-08-23 DIAGNOSIS — R293 Abnormal posture: Secondary | ICD-10-CM

## 2023-08-23 DIAGNOSIS — M6281 Muscle weakness (generalized): Secondary | ICD-10-CM

## 2023-08-23 DIAGNOSIS — R279 Unspecified lack of coordination: Secondary | ICD-10-CM

## 2023-08-23 NOTE — Therapy (Signed)
OUTPATIENT PHYSICAL THERAPY THORACOLUMBAR TREATMENT   Patient Name: Mariah Snyder MRN: [id number] DOB:[birth year], 77 y.o., female Today's Date: 08/23/2023  END OF SESSION:  PT End of Session - 08/23/23 1601     Visit Number 5    Date for PT Re-Evaluation 09/25/23    Authorization Type Medicare    Progress Note Due on Visit 10    PT Start Time 1310   a litltle late   PT Stop Time 1345    PT Time Calculation (min) 35 min    Activity Tolerance Patient tolerated treatment well    Behavior During Therapy WFL for tasks assessed/performed               Past Medical History:  Diagnosis Date   Anemia    Anxiety    Arthritis    Asthma    Fibromyalgia    Gallstones    GERD (gastroesophageal reflux disease)    Hyperlipidemia    Hypertension    Hypothyroidism    Kidney stones    Peptic ulcer    Rectal prolapse    Sleep apnea    Urinary tract infection    Past Surgical History:  Procedure Laterality Date   bunyionectomy Left    CHOLECYSTECTOMY     ELBOW ARTHROSCOPY Left    LAPAROSCOPIC ASSISTED VAGINAL HYSTERECTOMY     SHOULDER ARTHROSCOPY Right    TONSILLECTOMY     Patient Active Problem List   Diagnosis Date Noted   Pain due to onychomycosis of toenails of both feet 07/15/2023   Constipation 09/14/2022   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/2021   Left hip pain 10/12/2021   High cholesterol 03/07/2021   Moderate persistent asthma without complication 11/01/2020   Bereavement 02/06/2019   Bilateral temporomandibular joint pain 02/18/2018   Right leg numbness 05/21/2017   Squamous blepharitis of upper and lower eyelids of both eyes 05/21/2017   Meibomian gland dysfunction (MGD) of upper and lower lids of both eyes 02/20/2017   Chronic left-sided low back pain with left-sided sciatica 12/31/2016   OAB (overactive bladder) 08/29/2015   Hallux limitus 01/24/2015   Balance problems 05/12/2013   Lichen sclerosus 05/12/2013   Healthcare maintenance 01/12/2013    Essential tremor 07/08/2012   Gastro-esophageal reflux disease with esophagitis 07/02/2011   Fibromyalgia 05/27/2011   Keratoconjunctivitis sicca 05/10/2011   Senile nuclear sclerosis 05/10/2011   Tear film insufficiency 05/10/2011   Essential (primary) hypertension 02/26/2011   Degenerative spinal arthritis 08/28/2000   Hashimoto's thyroiditis 08/28/1980    PCP: Hillard Danker MD  REFERRING PROVIDER: Hillard Danker MD  REFERRING DIAG: [id number], G89.29 chronic left sided low back pain with left sided sciatica  Rationale for Evaluation and Treatment: Rehabilitation  THERAPY DIAG:  Back pain; weakness ONSET DATE: 6 months  SUBJECTIVE:  SUBJECTIVE STATEMENT: I'm in pain all the time. I am happy the water is warm.  PERTINENT HISTORY:  Pelvic floor PT with Sallyanne Havers had DN Fibromyalgia (hard to get out of bed in the morning); history of HTN, arthritis, asthma, chronic constipation, hypothyroidism; sleep apnea, essential tremor being treated with Botox (1st round didn't work)  overactive bladder, hysterectomy, and cholecystectomy   Volunteers at the hospital  Able to walk 2 miles 2x/week  Pt states she has been "lazy" about ex in the past PAIN:   Are you having pain? Yes NPRS scale: 4/10  Pain location: cervical today Pain orientation: Left and Lower  PAIN TYPE: sharp Pain description: intermittent and sharp  Aggravating factors: sitting long periods, lying on sofa on back; sleeping on left side, turning over in bed Relieving factors: lying prone pressing up with hands (learned in PT years ago and still does them 1x/week 20 reps; sitting shorter periods; walk around short distances  PRECAUTIONS: fall risk    WEIGHT BEARING RESTRICTIONS: No  FALLS:  Yes 3-4x in the last 6 months; fell 3  nights ago when she got up to go to the bathroom, toe slipped and hit back on the toilet, light bruise mid thoracic region left   LIVING ENVIRONMENT: Stairs: bedroom downstairs (avoids takes longer than it used to  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   PLOF: Independent  PATIENT GOALS: I hope the pain is gone;  I would like to walk more, ride a bicycle, sleep more  NEXT MD VISIT: call as needed  OBJECTIVE:  Note: Objective measures were completed at Evaluation unless otherwise noted.  DIAGNOSTIC FINDINGS:   11/02/2021  RI LUMBAR SPINE WITHOUT CONTRAST   TECHNIQUE: Multiplanar, multisequence MR imaging of the lumbar spine was performed. No intravenous contrast was administered.   COMPARISON:  X-ray lumbar 10/11/2021.   FINDINGS: Segmentation: Transitional lumbosacral vertebra numbered S1. This places hypoplastic ribs or unfused transverse processes at L1 by prior radiography.   Alignment:  Negative for listhesis or significant scoliosis   Vertebrae: No fracture, evidence of discitis, or aggressive bone lesion.   Conus medullaris and cauda equina: Conus extends to the L2 level. Conus and cauda equina appear normal.   Paraspinal and other soft tissues: Negative. 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   Disc levels:   T12- L1: Minor disc bulging   L1-L2: Mild disc bulging with small right paracentral protrusion   L2-L3: Minor disc bulging   L3-L4: Mild disc bulging   L4-L5: Disc space narrowing and bulging with small right paracentral protrusion. Mild facet spurring   L5-S1:Disc narrowing and mild bulging.  Negative facets.   IMPRESSION: Ordinary lumbar spine degeneration with widely patent canal and foramina throughout the lumbar spine.   Transitional vertebra numbered S1.    PATIENT SURVEYS:  FOTO current score 69%, projected 68%   pt's self ratings of physical performance is higher than expected with goal score lower than current score   COGNITION: Overall cognitive status: Within functional limits for tasks assessed     MUSCLE LENGTH: Hamstrings: Right 65 deg; Left 60 deg Thomas test: Right 10 deg; Left 5 deg  POSTURE: decreased lumbar lordosis  PALPATION: No tender points identified today  LUMBAR ROM: able to do a partial squat to pick up small object from the  floor  AROM eval  Flexion 70   Extension 20 painful  Right lateral flexion 15  Left lateral flexion 25  Right rotation   Left rotation    (Blank rows = not tested)  TRUNK STRENGTH:  Decreased activation of transverse abdominus muscles; abdominals 4-/5; decreased activation of lumbar multifidi; trunk extensors 4-/5  LOWER EXTREMITY ROM:   Grossly WFLS, some hip stiffness with internal and external rotation  LOWER EXTREMITY MMT:  Able to rise sit to stand without UE use Poor balance with SLS (needs UE support):  right/left pelvic drop indicating glute medius weakness left more than rigth   MMT Right eval Left eval  Hip flexion 4 4  Hip extension 4- 4-  Hip abduction 3+ 3-  Hip adduction    Hip internal rotation    Hip external rotation 4- 3+  Knee flexion 4 4  Knee extension 4 4-  Ankle dorsiflexion    Ankle plantarflexion    Ankle inversion    Ankle eversion     (Blank rows = not tested)  LUMBAR SPECIAL TESTS:  Straight leg raise test: Negative  FUNCTIONAL TESTS:  5x STS no hands 34.47 TUG 16.03 3 mwt 598 WIDE BASE OF SUPPORT,HEAVIER LEFT STRIKE  GAIT:  Comments: wide base of support, decreased gait speed, pelvic drop  TODAY'S TREATMENT:                                                                                                                              DATE:   08/23/23: Pt arrives for aquatic physical therapy. Treatment took place in 3.5-5.5 feet of  water. Water temperature was. Pt entered the pool via stairs slowly and step to step with rails. Pt requires buoyancy of water for support and to offload joints with strengthening exercises.  Seated water bench with 75% submersion Pt performed seated LE AROM exercises 20x in all planes, with concurrent verbal education on water principles and how we would use them. Pt verbally understood and previously (about a year ago did water walking at the [facility]atics center.) 75% water walking 4x in each direction with 1-2 min seated rest break in between directions. Seated UE arm movements 10x. High knee marching holding blue noodle 2 lengths, slowly and wobbly in SLS. Horizontal decompression float with 100% flotation for pain management and to decrease CNS input.   12/3: Nu-Step seat 7 blue machine L1 6 min Neuroscience of pain education: using meditation and other strategies to dec CNS sensitization Reviewed HEP and discussion of decreasing number of reps to 5 (reduced volume overall) Supine hand to knee isometric push 5x Supine red band clams 5x Seated 4# yellow plyo ball: hip to hip 5x, Vs (shoulder to shoulder) 5x Standing shoulder rows red band 5x  Standing extension red band 5x  Supine heat to lumbar with deep breathing/mindfulness 5 min Discussion of trial of aquatic ex pt given info- will try this Friday (appt moved from clinic to  pool)  11/20  Nu-Step seat 7 blue machine L3 5 min Reviewed HEP- v/c for form correct with seated chops & hook lying abduction 3 way SB stretch x 8 each direction Supine LTR x 10 each direction Supine TA activation x 10 - verbal and tactile cues for correct performance Supine hand to knee isometric x110 bilateral  alternating  Bent Knee Fallout x 10 Seated trunk extension with blue long band 2 x 10 Standing shoulder rows & extension red TB 2 x 10  Seated piriformis stretch  x 30 sec bilateral    PATIENT EDUCATION:  Education details: Educated patient on anatomy  and physiology of current symptoms, prognosis, plan of care as well as initial self care strategies to promote recovery Person educated: Patient Education method: Explanation Education comprehension: verbalized understanding  HOME EXERCISE PROGRAM: Access Code: [email or web address] URL: [email or web address] Date: 08/02/2023 Prepared by: Lavinia Sharps 12/3:  Limit to 5 reps with HEP secondary to flare ups following ex Exercises - Sit to Stand  - 1 x daily - 7 x weekly - 1 sets - 10 reps - Hooklying Clamshell with Resistance  - 1 x daily - 7 x weekly - 1 sets - 10 reps - Hooklying Isometric Hip Flexion with Opposite Arm  - 1 x daily - 7 x weekly - 1 sets - 10 reps - Seated Diagonal Chop with Medicine Ball  - 1 x daily - 7 x weekly - 4 sets - 5 reps - Standing Row with Anchored Resistance  - 1 x daily - 7 x weekly - 1 sets - 10 reps  ASSESSMENT:  CLINICAL IMPRESSION: Pt pleased with water temperature. Water walking stiff in the LE but decent balance. Most challenging was high knee marching keeping her balance when in single limb stance. Pt reported feeling no pain when floated in horizontal position.   OBJECTIVE IMPAIRMENTS: decreased activity tolerance, difficulty walking, decreased ROM, decreased strength, impaired perceived functional ability, and pain.   ACTIVITY LIMITATIONS: sitting, sleeping, stairs, bed mobility, and locomotion level  PARTICIPATION LIMITATIONS: interpersonal relationship, community activity, and walking longer distances, spending time with granddaughter  PERSONAL FACTORS: 3+ comorbidities: fibromyalgia, frequent falls, time since onset, tremors, HTN, multi jt OA  are also affecting patient's functional outcome.   REHAB POTENTIAL: Good  CLINICAL DECISION MAKING: Stable/uncomplicated  EVALUATION COMPLEXITY: Low   GOALS: Goals reviewed with patient? Yes  SHORT TERM GOALS: Target date: 08/28/2023    The patient will demonstrate knowledge of basic self  care strategies and exercises to promote healing  Baseline: Goal status: INITIAL  2.  25% improvement in sleep reported /decreased back pain Baseline:  Goal status: INITIAL  3.  The patient will have improved hip strength to at least 4-/5 needed for standing, walking longer distances and descending stairs at home and in the community  Baseline:  Goal status: INITIAL  4.  Improved LE and trunk strength with greater ease with sit to stand 5x in <25 sec Baseline:  Goal status: INITIAL   LONG TERM GOALS: Target date: 09/25/2023    The patient will be independent in a safe self progression of a home exercise program to promote further recovery of function  Baseline:  Goal status: INITIAL  2.  50% improvement in sleep/ due to decreased back pain Baseline:  Goal status: INITIAL  3.  The patient will have improved hip strength to at least 4/5 needed for standing, walking longer distances and descending stairs at home and in the community  Baseline:  Goal status: INITIAL  4.  The patient will increase walking program from 2x/week to 3-4x/week Baseline:  Goal status: INITIAL  5.  The patient will have improved trunk flexor and extensor muscle strength needed for lifting medium weight objects such as grocery bags, laundry and luggage  Baseline:  Goal status: INITIAL    PLAN:  PT FREQUENCY: 2x/week  PT DURATION: 8 weeks  PLANNED INTERVENTIONS: 97164- PT Re-evaluation, 97110-Therapeutic exercises, 97530- Therapeutic activity, 97112- Neuromuscular re-education, 97535- Self Care, 16109- Manual therapy, [id number]- Aquatic Therapy, 97014- Electrical stimulation (unattended), [id number]- Electrical stimulation (manual), [id number]- Ultrasound, [id number]- Traction (mechanical), [id number]- Ionotophoresis 4mg /ml Dexamethasone, Patient/Family education, Taping, Dry Needling, Joint mobilization, Spinal mobilization, Cryotherapy, and Moist heat.  PLAN FOR NEXT SESSION: See how pt tolerated first aquatic session.  Pt indicated she would call office to schedule more visits in the water if she liked it.   Ane Payment, PTA 08/23/23 4:02 PM

## 2023-08-27 ENCOUNTER — Ambulatory Visit: Payer: Medicare Other | Admitting: Physical Therapy

## 2023-08-27 ENCOUNTER — Encounter: Payer: Self-pay | Admitting: Physical Therapy

## 2023-08-27 DIAGNOSIS — M6281 Muscle weakness (generalized): Secondary | ICD-10-CM

## 2023-08-27 DIAGNOSIS — G8929 Other chronic pain: Secondary | ICD-10-CM

## 2023-08-27 DIAGNOSIS — R262 Difficulty in walking, not elsewhere classified: Secondary | ICD-10-CM

## 2023-08-27 DIAGNOSIS — M5442 Lumbago with sciatica, left side: Secondary | ICD-10-CM | POA: Diagnosis not present

## 2023-08-27 NOTE — Therapy (Signed)
OUTPATIENT PHYSICAL THERAPY THORACOLUMBAR TREATMENT   Patient Name: Mariah Snyder MRN: [id number] DOB:03-02-1946, 77 y.o., female Today's Date: 08/27/2023  END OF SESSION:  PT End of Session - 08/27/23 1146     Visit Number 6    Date for PT Re-Evaluation 09/25/23    Authorization Type Medicare    Progress Note Due on Visit 10    PT Start Time 1105    PT Stop Time 1145    PT Time Calculation (min) 40 min    Activity Tolerance Patient tolerated treatment well    Behavior During Therapy WFL for tasks assessed/performed                Past Medical History:  Diagnosis Date   Anemia    Anxiety    Arthritis    Asthma    Fibromyalgia    Gallstones    GERD (gastroesophageal reflux disease)    Hyperlipidemia    Hypertension    Hypothyroidism    Kidney stones    Peptic ulcer    Rectal prolapse    Sleep apnea    Urinary tract infection    Past Surgical History:  Procedure Laterality Date   bunyionectomy Left    CHOLECYSTECTOMY     ELBOW ARTHROSCOPY Left    LAPAROSCOPIC ASSISTED VAGINAL HYSTERECTOMY     SHOULDER ARTHROSCOPY Right    TONSILLECTOMY     Patient Active Problem List   Diagnosis Date Noted   Pain due to onychomycosis of toenails of both feet 07/15/2023   Constipation 09/14/2022   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/2021   Left hip pain 10/12/2021   High cholesterol 03/07/2021   Moderate persistent asthma without complication 11/01/2020   Bereavement 02/06/2019   Bilateral temporomandibular joint pain 02/18/2018   Right leg numbness 05/21/2017   Squamous blepharitis of upper and lower eyelids of both eyes 05/21/2017   Meibomian gland dysfunction (MGD) of upper and lower lids of both eyes 02/20/2017   Chronic left-sided low back pain with left-sided sciatica 12/31/2016   OAB (overactive bladder) 08/29/2015   Hallux limitus 01/24/2015   Balance problems 05/12/2013   Lichen sclerosus 05/12/2013   Healthcare maintenance 01/12/2013   Essential tremor  07/08/2012   Gastro-esophageal reflux disease with esophagitis 07/02/2011   Fibromyalgia 05/27/2011   Keratoconjunctivitis sicca 05/10/2011   Senile nuclear sclerosis 05/10/2011   Tear film insufficiency 05/10/2011   Essential (primary) hypertension 02/26/2011   Degenerative spinal arthritis 08/28/2000   Hashimoto's thyroiditis 08/28/1980    PCP: Hillard Danker MD  REFERRING PROVIDER: Hillard Danker MD  REFERRING DIAG: [phone], G89.29 chronic left sided low back pain with left sided sciatica  Rationale for Evaluation and Treatment: Rehabilitation  THERAPY DIAG:  Back pain; weakness ONSET DATE: 6 months  SUBJECTIVE:  SUBJECTIVE STATEMENT: Patient reports she is doing okay today. She really enjoyed aquatics and she had almost a full day without having any back pain. She would like to continue aquatics.  PERTINENT HISTORY:  Pelvic floor PT with Sallyanne Havers had DN Fibromyalgia (hard to get out of bed in the morning); history of HTN, arthritis, asthma, chronic constipation, hypothyroidism; sleep apnea, essential tremor being treated with Botox (1st round didn't work)  overactive bladder, hysterectomy, and cholecystectomy   Volunteers at the hospital  Able to walk 2 miles 2x/week  Pt states she has been "lazy" about ex in the past  PAIN: 09/26/2022  Are you having pain? Yes NPRS scale: 5/10  Pain location: cervical today Pain orientation: Left and Lower  PAIN TYPE: sharp Pain description: intermittent and sharp  Aggravating factors: sitting long periods, lying on sofa on back; sleeping on left side, turning over in bed Relieving factors: lying prone pressing up with hands (learned in PT years ago and still does them 1x/week 20 reps; sitting shorter periods; walk around short  distances  PRECAUTIONS: fall risk    WEIGHT BEARING RESTRICTIONS: No  FALLS:  Yes 3-4x in the last 6 months; fell 3 nights ago when she got up to go to the bathroom, toe slipped and hit back on the toilet, light bruise mid thoracic region left   LIVING ENVIRONMENT: Stairs: bedroom downstairs (avoids takes longer than it used to  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   PLOF: Independent  PATIENT GOALS: I hope the pain is gone;  I would like to walk more, ride a bicycle, sleep more  NEXT MD VISIT: call as needed  OBJECTIVE:  Note: Objective measures were completed at Evaluation unless otherwise noted.  DIAGNOSTIC FINDINGS:   11/02/2021  RI LUMBAR SPINE WITHOUT CONTRAST   TECHNIQUE: Multiplanar, multisequence MR imaging of the lumbar spine was performed. No intravenous contrast was administered.   COMPARISON:  X-ray lumbar 10/11/2021.   FINDINGS: Segmentation: Transitional lumbosacral vertebra numbered S1. This places hypoplastic ribs or unfused transverse processes at L1 by prior radiography.   Alignment:  Negative for listhesis or significant scoliosis   Vertebrae: No fracture, evidence of discitis, or aggressive bone lesion.   Conus medullaris and cauda equina: Conus extends to the L2 level. Conus and cauda equina appear normal.   Paraspinal and other soft tissues: Negative. 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   Disc levels:   T12- L1: Minor disc bulging   L1-L2: Mild disc bulging with small right paracentral protrusion   L2-L3: Minor disc bulging   L3-L4: Mild disc bulging   L4-L5: Disc space narrowing and bulging with small right paracentral protrusion. Mild facet spurring   L5-S1:Disc narrowing and mild bulging.  Negative facets.   IMPRESSION: Ordinary lumbar  spine degeneration with widely patent canal and foramina throughout the lumbar spine.   Transitional vertebra numbered S1.   PATIENT SURVEYS:  FOTO current score 69%, projected 68%   pt's self ratings of physical performance is higher than expected with goal score lower than current score   COGNITION: Overall cognitive status: Within functional limits for tasks assessed     MUSCLE LENGTH: Hamstrings: Right 65 deg; Left 60 deg Thomas test: Right 10 deg; Left 5 deg  POSTURE: decreased lumbar lordosis  PALPATION: No tender points identified  today  LUMBAR ROM: able to do a partial squat to pick up small object from the floor  AROM eval  Flexion 70   Extension 20 painful  Right lateral flexion 15  Left lateral flexion 25  Right rotation   Left rotation    (Blank rows = not tested)  TRUNK STRENGTH:  Decreased activation of transverse abdominus muscles; abdominals 4-/5; decreased activation of lumbar multifidi; trunk extensors 4-/5  LOWER EXTREMITY ROM:   Grossly WFLS, some hip stiffness with internal and external rotation  LOWER EXTREMITY MMT:  Able to rise sit to stand without UE use Poor balance with SLS (needs UE support):  right/left pelvic drop indicating glute medius weakness left more than rigth   MMT Right eval Left eval  Hip flexion 4 4  Hip extension 4- 4-  Hip abduction 3+ 3-  Hip adduction    Hip internal rotation    Hip external rotation 4- 3+  Knee flexion 4 4  Knee extension 4 4-  Ankle dorsiflexion    Ankle plantarflexion    Ankle inversion    Ankle eversion     (Blank rows = not tested)  LUMBAR SPECIAL TESTS:  Straight leg raise test: Negative  FUNCTIONAL TESTS:  5x STS no hands 34.47 TUG 16.03 3 mwt 598 WIDE BASE OF SUPPORT,HEAVIER LEFT STRIKE  GAIT:  Comments: wide base of support, decreased gait speed, pelvic drop  TODAY'S TREATMENT:                                                                                                                               DATE:  12/10: Nu-Step seat 7 blue machine L5 5 min Supine LTR x 8 each direction 5 sec hold TA contraction + hip flexion x 10 bilateral  Supine hand to knee isometric push x8 Supine red band clams 2 x10 Seated 4# yellow plyo ball: hip to hip x10; opp shoulder to hip x 10 bilateral  Sit to Stand x 8 no UE support Seated shoulder rows red band x10 Standing extension red band x10 Seated knee flexion with green TB x 15 bilateral  Seated LAQ 2# AW x 10 bilateral    08/23/23: Pt arrives for aquatic physical therapy. Treatment took place in 3.5-5.5 feet of water. Water temperature was. Pt entered the pool via stairs slowly and step to step with rails. Pt requires buoyancy of water for support and to offload joints with strengthening exercises.  Seated water bench with 75% submersion Pt performed seated LE AROM exercises 20x in all planes, with concurrent verbal education on water principles and how we would use them. Pt verbally understood and previously (about a year ago did water walking [facility] the [facility].) 75% water walking 4x in each direction with 1-2 min seated rest break in between directions. Seated UE arm movements 10x. High knee marching holding blue noodle 2 lengths, slowly and wobbly in SLS. Horizontal decompression float with 100% flotation  for pain management and to decrease CNS input.   12/3: Nu-Step seat 7 blue machine L1 6 min Neuroscience of pain education: using meditation and other strategies to dec CNS sensitization Reviewed HEP and discussion of decreasing number of reps to 5 (reduced volume overall) Supine hand to knee isometric push 5x Supine red band clams 5x Seated 4# yellow plyo ball: hip to hip 5x, Vs (shoulder to shoulder) 5x Standing shoulder rows red band 5x  Standing extension red band 5x  Supine heat to lumbar with deep breathing/mindfulness 5 min Discussion of trial of aquatic ex pt given info- will try this Friday (appt moved from  clinic to pool)   PATIENT EDUCATION:  Education details: Educated patient on anatomy and physiology of current symptoms, prognosis, plan of care as well as initial self care strategies to promote recovery Person educated: Patient Education method: Explanation Education comprehension: verbalized understanding  HOME EXERCISE PROGRAM: Access Code: [email or web address] URL: [email or web address] Date: 08/02/2023 Prepared by: Lavinia Sharps 12/3:  Limit to 5 reps with HEP secondary to flare ups following ex Exercises - Sit to Stand  - 1 x daily - 7 x weekly - 1 sets - 10 reps - Hooklying Clamshell with Resistance  - 1 x daily - 7 x weekly - 1 sets - 10 reps - Hooklying Isometric Hip Flexion with Opposite Arm  - 1 x daily - 7 x weekly - 1 sets - 10 reps - Seated Diagonal Chop with Medicine Ball  - 1 x daily - 7 x weekly - 4 sets - 5 reps - Standing Row with Anchored Resistance  - 1 x daily - 7 x weekly - 1 sets - 10 reps  ASSESSMENT:  CLINICAL IMPRESSION: Today's treatment session focused on core strengthening and lumbar mobility. Mariah Snyder enjoyed aquatic therapy session and she would like to continue incorporating aquatics. Patient tolerated treatment session well and was able to participate in higher level exercises compared to previous session. She required verbal and tactile cues for correct exercise performance. Patient will benefit from skilled PT to address the below impairments and improve overall function.   OBJECTIVE IMPAIRMENTS: decreased activity tolerance, difficulty walking, decreased ROM, decreased strength, impaired perceived functional ability, and pain.   ACTIVITY LIMITATIONS: sitting, sleeping, stairs, bed mobility, and locomotion level  PARTICIPATION LIMITATIONS: interpersonal relationship, community activity, and walking longer distances, spending time with granddaughter  PERSONAL FACTORS: 3+ comorbidities: fibromyalgia, frequent falls, time since onset, tremors, HTN,  multi jt OA  are also affecting patient's functional outcome.   REHAB POTENTIAL: Good  CLINICAL DECISION MAKING: Stable/uncomplicated  EVALUATION COMPLEXITY: Low   GOALS: Goals reviewed with patient? Yes  SHORT TERM GOALS: Target date: 08/28/2023    The patient will demonstrate knowledge of basic self care strategies and exercises to promote healing  Baseline: Goal status: INITIAL  2.  25% improvement in sleep reported /decreased back pain Baseline:  Goal status: INITIAL  3.  The patient will have improved hip strength to at least 4-/5 needed for standing, walking longer distances and descending stairs at home and in the community  Baseline:  Goal status: INITIAL  4.  Improved LE and trunk strength with greater ease with sit to stand 5x in <25 sec Baseline:  Goal status: INITIAL   LONG TERM GOALS: Target date: 09/25/2023    The patient will be independent in a safe self progression of a home exercise program to promote further recovery of function  Baseline:  Goal status: INITIAL  2.  50%  improvement in sleep/ due to decreased back pain Baseline:  Goal status: INITIAL  3.  The patient will have improved hip strength to at least 4/5 needed for standing, walking longer distances and descending stairs at home and in the community  Baseline:  Goal status: INITIAL  4.  The patient will increase walking program from 2x/week to 3-4x/week Baseline:  Goal status: INITIAL  5.  The patient will have improved trunk flexor and extensor muscle strength needed for lifting medium weight objects such as grocery bags, laundry and luggage  Baseline:  Goal status: INITIAL    PLAN:  PT FREQUENCY: 2x/week  PT DURATION: 8 weeks  PLANNED INTERVENTIONS: [id number]- PT Re-evaluation, 97110-Therapeutic exercises, 97530- Therapeutic activity, 97112- Neuromuscular re-education, 97535- Self Care, 16109- Manual therapy, [id number]- Aquatic Therapy, 97014- Electrical stimulation (unattended),  [id number]- Electrical stimulation (manual), [id number]- Ultrasound, [id number]- Traction (mechanical), [id number]- Ionotophoresis 4mg /ml Dexamethasone, Patient/Family education, Taping, Dry Needling, Joint mobilization, Spinal mobilization, Cryotherapy, and Moist heat.  PLAN FOR NEXT SESSION: assess tolerance to treatment session; continue core and LE strengthening as tolerated   Claude Manges, PT 08/27/23 11:46 AM

## 2023-08-30 ENCOUNTER — Ambulatory Visit: Payer: Medicare Other | Admitting: Physical Therapy

## 2023-08-30 ENCOUNTER — Encounter: Payer: Self-pay | Admitting: Physical Therapy

## 2023-08-30 DIAGNOSIS — R293 Abnormal posture: Secondary | ICD-10-CM

## 2023-08-30 DIAGNOSIS — R262 Difficulty in walking, not elsewhere classified: Secondary | ICD-10-CM

## 2023-08-30 DIAGNOSIS — R279 Unspecified lack of coordination: Secondary | ICD-10-CM

## 2023-08-30 DIAGNOSIS — M5442 Lumbago with sciatica, left side: Secondary | ICD-10-CM | POA: Diagnosis not present

## 2023-08-30 DIAGNOSIS — M6281 Muscle weakness (generalized): Secondary | ICD-10-CM

## 2023-08-30 DIAGNOSIS — G8929 Other chronic pain: Secondary | ICD-10-CM

## 2023-08-30 NOTE — Therapy (Signed)
OUTPATIENT PHYSICAL THERAPY THORACOLUMBAR TREATMENT   Patient Name: Levetta Aran MRN: [id number] DOB:09-May-1946, 77 y.o., female Today's Date: 08/30/2023  END OF SESSION:  PT End of Session - 08/30/23 1100     Visit Number 7    Date for PT Re-Evaluation 09/25/23    Authorization Type Medicare    Progress Note Due on Visit 10    PT Start Time 1016    PT Stop Time 1100    PT Time Calculation (min) 44 min    Activity Tolerance Patient tolerated treatment well    Behavior During Therapy WFL for tasks assessed/performed                 Past Medical History:  Diagnosis Date   Anemia    Anxiety    Arthritis    Asthma    Fibromyalgia    Gallstones    GERD (gastroesophageal reflux disease)    Hyperlipidemia    Hypertension    Hypothyroidism    Kidney stones    Peptic ulcer    Rectal prolapse    Sleep apnea    Urinary tract infection    Past Surgical History:  Procedure Laterality Date   bunyionectomy Left    CHOLECYSTECTOMY     ELBOW ARTHROSCOPY Left    LAPAROSCOPIC ASSISTED VAGINAL HYSTERECTOMY     SHOULDER ARTHROSCOPY Right    TONSILLECTOMY     Patient Active Problem List   Diagnosis Date Noted   Pain due to onychomycosis of toenails of both feet 07/15/2023   Constipation 09/14/2022   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/2021   Left hip pain 10/12/2021   High cholesterol 03/07/2021   Moderate persistent asthma without complication 11/01/2020   Bereavement 02/06/2019   Bilateral temporomandibular joint pain 02/18/2018   Right leg numbness 05/21/2017   Squamous blepharitis of upper and lower eyelids of both eyes 05/21/2017   Meibomian gland dysfunction (MGD) of upper and lower lids of both eyes 02/20/2017   Chronic left-sided low back pain with left-sided sciatica 12/31/2016   OAB (overactive bladder) 08/29/2015   Hallux limitus 01/24/2015   Balance problems 05/12/2013   Lichen sclerosus 05/12/2013   Healthcare maintenance 01/12/2013   Essential tremor  07/08/2012   Gastro-esophageal reflux disease with esophagitis 07/02/2011   Fibromyalgia 05/27/2011   Keratoconjunctivitis sicca 05/10/2011   Senile nuclear sclerosis 05/10/2011   Tear film insufficiency 05/10/2011   Essential (primary) hypertension 02/26/2011   Degenerative spinal arthritis 08/28/2000   Hashimoto's thyroiditis 08/28/1980    PCP: Hillard Danker MD  REFERRING PROVIDER: Hillard Danker MD  REFERRING DIAG: [phone], G89.29 chronic left sided low back pain with left sided sciatica  Rationale for Evaluation and Treatment: Rehabilitation  THERAPY DIAG:  Back pain; weakness ONSET DATE: 6 months  SUBJECTIVE:  SUBJECTIVE STATEMENT: Patient reports she is doing okay today. She really enjoyed aquatics and she had almost a full day without having any back pain. She would like to continue aquatics.  PERTINENT HISTORY:  Pelvic floor PT with Sallyanne Havers had DN Fibromyalgia (hard to get out of bed in the morning); history of HTN, arthritis, asthma, chronic constipation, hypothyroidism; sleep apnea, essential tremor being treated with Botox (1st round didn't work)  overactive bladder, hysterectomy, and cholecystectomy   Volunteers at the hospital  Able to walk 2 miles 2x/week  Pt states she has been "lazy" about ex in the past  PAIN: 09/26/2022  Are you having pain? Yes NPRS scale: 5/10  Pain location: cervical today Pain orientation: Left and Lower  PAIN TYPE: sharp Pain description: intermittent and sharp  Aggravating factors: sitting long periods, lying on sofa on back; sleeping on left side, turning over in bed Relieving factors: lying prone pressing up with hands (learned in PT years ago and still does them 1x/week 20 reps; sitting shorter periods; walk around short  distances  PRECAUTIONS: fall risk    WEIGHT BEARING RESTRICTIONS: No  FALLS:  Yes 3-4x in the last 6 months; fell 3 nights ago when she got up to go to the bathroom, toe slipped and hit back on the toilet, light bruise mid thoracic region left   LIVING ENVIRONMENT: Stairs: bedroom downstairs (avoids takes longer than it used to  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   PLOF: Independent  PATIENT GOALS: I hope the pain is gone;  I would like to walk more, ride a bicycle, sleep more  NEXT MD VISIT: call as needed  OBJECTIVE:  Note: Objective measures were completed at Evaluation unless otherwise noted.  DIAGNOSTIC FINDINGS:   11/02/2021  RI LUMBAR SPINE WITHOUT CONTRAST   TECHNIQUE: Multiplanar, multisequence MR imaging of the lumbar spine was performed. No intravenous contrast was administered.   COMPARISON:  X-ray lumbar 10/11/2021.   FINDINGS: Segmentation: Transitional lumbosacral vertebra numbered S1. This places hypoplastic ribs or unfused transverse processes at L1 by prior radiography.   Alignment:  Negative for listhesis or significant scoliosis   Vertebrae: No fracture, evidence of discitis, or aggressive bone lesion.   Conus medullaris and cauda equina: Conus extends to the L2 level. Conus and cauda equina appear normal.   Paraspinal and other soft tissues: Negative. 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   Disc levels:   T12- L1: Minor disc bulging   L1-L2: Mild disc bulging with small right paracentral protrusion   L2-L3: Minor disc bulging   L3-L4: Mild disc bulging   L4-L5: Disc space narrowing and bulging with small right paracentral protrusion. Mild facet spurring   L5-S1:Disc narrowing and mild bulging.  Negative facets.   IMPRESSION: Ordinary lumbar  spine degeneration with widely patent canal and foramina throughout the lumbar spine.   Transitional vertebra numbered S1.   PATIENT SURVEYS:  FOTO current score 69%, projected 68%   pt's self ratings of physical performance is higher than expected with goal score lower than current score   COGNITION: Overall cognitive status: Within functional limits for tasks assessed     MUSCLE LENGTH: Hamstrings: Right 65 deg; Left 60 deg Thomas test: Right 10 deg; Left 5 deg  POSTURE: decreased lumbar lordosis  PALPATION: No tender points identified  today  LUMBAR ROM: able to do a partial squat to pick up small object from the floor  AROM eval  Flexion 70   Extension 20 painful  Right lateral flexion 15  Left lateral flexion 25  Right rotation   Left rotation    (Blank rows = not tested)  TRUNK STRENGTH:  Decreased activation of transverse abdominus muscles; abdominals 4-/5; decreased activation of lumbar multifidi; trunk extensors 4-/5  LOWER EXTREMITY ROM:   Grossly WFLS, some hip stiffness with internal and external rotation  LOWER EXTREMITY MMT:  Able to rise sit to stand without UE use Poor balance with SLS (needs UE support):  right/left pelvic drop indicating glute medius weakness left more than rigth   MMT Right eval Left eval  Hip flexion 4 4  Hip extension 4- 4-  Hip abduction 3+ 3-  Hip adduction    Hip internal rotation    Hip external rotation 4- 3+  Knee flexion 4 4  Knee extension 4 4-  Ankle dorsiflexion    Ankle plantarflexion    Ankle inversion    Ankle eversion     (Blank rows = not tested)  LUMBAR SPECIAL TESTS:  Straight leg raise test: Negative  FUNCTIONAL TESTS:  5x STS no hands 34.47 TUG 16.03 3 mwt 598 WIDE BASE OF SUPPORT,HEAVIER LEFT STRIKE  GAIT:  Comments: wide base of support, decreased gait speed, pelvic drop  TODAY'S TREATMENT:                                                                                                                               DATE:  12/13: Nu-Step seat 7 blue machine L5 6 min Supine LTR x 8 each direction 5 sec hold Seated hamstring stretch 2 x 30 sec bilateral  TA contraction + hip flexion  with yellow loop x 10 bilateral  Supine red band clams 2 x10 Seated trunk extension with long pink loop Supine yellow band clams 2 x10 TA contraction + SLR x 8 each - increased back pain Manual: Addaday to bilateral lumbar paraspinals for improved circulation. PT present to monitor stauts Seated Hip flexion & LAQ 2# AW 2 x 10 bilateral  Sit to Stand x 8 no UE support     12/10: Nu-Step seat 7 blue machine L5 5 min Supine LTR x 8 each direction 5 sec hold TA contraction + hip flexion x 10 bilateral  Supine hand to knee isometric push x8 Supine red band clams 2 x10 Seated 4# yellow plyo ball: hip to hip x10; opp shoulder to hip x 10 bilateral  Sit to Stand x 8 no UE support Seated shoulder rows red band x10 Standing extension red band x10 Seated knee flexion with green TB x 15 bilateral  Seated LAQ 2# AW x 10 bilateral    08/23/23: Pt arrives for aquatic physical therapy. Treatment took place in 3.5-5.5 feet of water. Water temperature was. Pt entered the pool via  stairs slowly and step to step with rails. Pt requires buoyancy of water for support and to offload joints with strengthening exercises.  Seated water bench with 75% submersion Pt performed seated LE AROM exercises 20x in all planes, with concurrent verbal education on water principles and how we would use them. Pt verbally understood and previously (about a year ago did water walking at the Aquatics center.) 75% water walking 4x in each direction with 1-2 min seated rest break in between directions. Seated UE arm movements 10x. High knee marching holding blue noodle 2 lengths, slowly and wobbly in SLS. Horizontal decompression float with 100% flotation for pain management and to decrease CNS input.    PATIENT EDUCATION:  Education  details: Educated patient on anatomy and physiology of current symptoms, prognosis, plan of care as well as initial self care strategies to promote recovery Person educated: Patient Education method: Explanation Education comprehension: verbalized understanding  HOME EXERCISE PROGRAM: Access Code: [email or web address] URL: [email or web address] Date: 08/02/2023 Prepared by: Lavinia Sharps 12/3:  Limit to 5 reps with HEP secondary to flare ups following ex Exercises - Sit to Stand  - 1 x daily - 7 x weekly - 1 sets - 10 reps - Hooklying Clamshell with Resistance  - 1 x daily - 7 x weekly - 1 sets - 10 reps - Hooklying Isometric Hip Flexion with Opposite Arm  - 1 x daily - 7 x weekly - 1 sets - 10 reps - Seated Diagonal Chop with Medicine Ball  - 1 x daily - 7 x weekly - 4 sets - 5 reps - Standing Row with Anchored Resistance  - 1 x daily - 7 x weekly - 1 sets - 10 reps  ASSESSMENT:  CLINICAL IMPRESSION:  Today's treatment session focused on core strengthening and hip mobility. Chales Abrahams responded well to previous treatment session and verbalized increased soreness. Progressed patient's core exercises today and she required verbal and tactile cues for correct performance. Patient verbalized increased back pain after performing supine SLR + TA contraction. Patient responded favorably to manual therapy techniques and verbalized decreased pain. Patient will benefit from skilled PT to address the below impairments and improve overall function.   OBJECTIVE IMPAIRMENTS: decreased activity tolerance, difficulty walking, decreased ROM, decreased strength, impaired perceived functional ability, and pain.   ACTIVITY LIMITATIONS: sitting, sleeping, stairs, bed mobility, and locomotion level  PARTICIPATION LIMITATIONS: interpersonal relationship, community activity, and walking longer distances, spending time with granddaughter  PERSONAL FACTORS: 3+ comorbidities: fibromyalgia, frequent falls, time  since onset, tremors, HTN, multi jt OA  are also affecting patient's functional outcome.   REHAB POTENTIAL: Good  CLINICAL DECISION MAKING: Stable/uncomplicated  EVALUATION COMPLEXITY: Low   GOALS: Goals reviewed with patient? Yes  SHORT TERM GOALS: Target date: 08/28/2023    The patient will demonstrate knowledge of basic self care strategies and exercises to promote healing  Baseline: Goal status: INITIAL  2.  25% improvement in sleep reported /decreased back pain Baseline:  Goal status: INITIAL  3.  The patient will have improved hip strength to at least 4-/5 needed for standing, walking longer distances and descending stairs at home and in the community  Baseline:  Goal status: INITIAL  4.  Improved LE and trunk strength with greater ease with sit to stand 5x in <25 sec Baseline:  Goal status: INITIAL   LONG TERM GOALS: Target date: 09/25/2023    The patient will be independent in a safe self progression of a home exercise program to promote further  recovery of function  Baseline:  Goal status: INITIAL  2.  50% improvement in sleep/ due to decreased back pain Baseline:  Goal status: INITIAL  3.  The patient will have improved hip strength to at least 4/5 needed for standing, walking longer distances and descending stairs at home and in the community  Baseline:  Goal status: INITIAL  4.  The patient will increase walking program from 2x/week to 3-4x/week Baseline:  Goal status: INITIAL  5.  The patient will have improved trunk flexor and extensor muscle strength needed for lifting medium weight objects such as grocery bags, laundry and luggage  Baseline:  Goal status: INITIAL    PLAN:  PT FREQUENCY: 2x/week  PT DURATION: 8 weeks  PLANNED INTERVENTIONS: [id number]- PT Re-evaluation, 97110-Therapeutic exercises, 97530- Therapeutic activity, 97112- Neuromuscular re-education, 97535- Self Care, 98119- Manual therapy, [id number]- Aquatic Therapy, 97014- Electrical  stimulation (unattended), [id number]- Electrical stimulation (manual), [id number]- Ultrasound, [id number]- Traction (mechanical), [id number]- Ionotophoresis 4mg /ml Dexamethasone, Patient/Family education, Taping, Dry Needling, Joint mobilization, Spinal mobilization, Cryotherapy, and Moist heat.  PLAN FOR NEXT SESSION: stair negotiation; trunk extension ; reassess functional tests   Claude Manges, PT 08/30/23 11:01 AM

## 2023-09-02 ENCOUNTER — Encounter: Payer: Self-pay | Admitting: Physical Therapy

## 2023-09-02 ENCOUNTER — Ambulatory Visit: Payer: Medicare Other | Admitting: Physical Therapy

## 2023-09-02 DIAGNOSIS — R262 Difficulty in walking, not elsewhere classified: Secondary | ICD-10-CM

## 2023-09-02 DIAGNOSIS — M6281 Muscle weakness (generalized): Secondary | ICD-10-CM

## 2023-09-02 DIAGNOSIS — R279 Unspecified lack of coordination: Secondary | ICD-10-CM

## 2023-09-02 DIAGNOSIS — G8929 Other chronic pain: Secondary | ICD-10-CM

## 2023-09-02 DIAGNOSIS — M5442 Lumbago with sciatica, left side: Secondary | ICD-10-CM | POA: Diagnosis not present

## 2023-09-02 DIAGNOSIS — R293 Abnormal posture: Secondary | ICD-10-CM

## 2023-09-02 NOTE — Therapy (Signed)
OUTPATIENT PHYSICAL THERAPY THORACOLUMBAR TREATMENT   Patient Name: Mariah Snyder MRN: [id number] DOB:04/05/1946, 77 y.o., female Today's Date: 09/02/2023  END OF SESSION:  PT End of Session - 09/02/23 1318     Visit Number 8    Date for PT Re-Evaluation 09/25/23    Authorization Type Medicare    Progress Note Due on Visit 10    PT Start Time 1231    PT Stop Time 1315    PT Time Calculation (min) 44 min    Activity Tolerance Patient tolerated treatment well    Behavior During Therapy WFL for tasks assessed/performed                  Past Medical History:  Diagnosis Date   Anemia    Anxiety    Arthritis    Asthma    Fibromyalgia    Gallstones    GERD (gastroesophageal reflux disease)    Hyperlipidemia    Hypertension    Hypothyroidism    Kidney stones    Peptic ulcer    Rectal prolapse    Sleep apnea    Urinary tract infection    Past Surgical History:  Procedure Laterality Date   bunyionectomy Left    CHOLECYSTECTOMY     ELBOW ARTHROSCOPY Left    LAPAROSCOPIC ASSISTED VAGINAL HYSTERECTOMY     SHOULDER ARTHROSCOPY Right    TONSILLECTOMY     Patient Active Problem List   Diagnosis Date Noted   Pain due to onychomycosis of toenails of both feet 07/15/2023   Constipation 09/14/2022   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/2021   Left hip pain 10/12/2021   High cholesterol 03/07/2021   Moderate persistent asthma without complication 11/01/2020   Bereavement 02/06/2019   Bilateral temporomandibular joint pain 02/18/2018   Right leg numbness 05/21/2017   Squamous blepharitis of upper and lower eyelids of both eyes 05/21/2017   Meibomian gland dysfunction (MGD) of upper and lower lids of both eyes 02/20/2017   Chronic left-sided low back pain with left-sided sciatica 12/31/2016   OAB (overactive bladder) 08/29/2015   Hallux limitus 01/24/2015   Balance problems 05/12/2013   Lichen sclerosus 05/12/2013   Healthcare maintenance 01/12/2013   Essential tremor  07/08/2012   Gastro-esophageal reflux disease with esophagitis 07/02/2011   Fibromyalgia 05/27/2011   Keratoconjunctivitis sicca 05/10/2011   Senile nuclear sclerosis 05/10/2011   Tear film insufficiency 05/10/2011   Essential (primary) hypertension 02/26/2011   Degenerative spinal arthritis 08/28/2000   Hashimoto's thyroiditis 08/28/1980    PCP: Hillard Danker MD  REFERRING PROVIDER: Hillard Danker MD  REFERRING DIAG: [phone], G89.29 chronic left sided low back pain with left sided sciatica  Rationale for Evaluation and Treatment: Rehabilitation  THERAPY DIAG:  Back pain; weakness ONSET DATE: 6 months  SUBJECTIVE:  SUBJECTIVE STATEMENT: Patient reports she is doing okay today. She woke up with increased back pain but after walking her dog and moving it felt better  PERTINENT HISTORY:  Pelvic floor PT with Sallyanne Havers had DN Fibromyalgia (hard to get out of bed in the morning); history of HTN, arthritis, asthma, chronic constipation, hypothyroidism; sleep apnea, essential tremor being treated with Botox (1st round didn't work)  overactive bladder, hysterectomy, and cholecystectomy   Volunteers at the hospital  Able to walk 2 miles 2x/week  Pt states she has been "lazy" about ex in the past  PAIN: 09/02/2023  Are you having pain? Yes NPRS scale: 4/10  Pain location: cervical today Pain orientation: Left and Lower  PAIN TYPE: sharp Pain description: intermittent and sharp  Aggravating factors: sitting long periods, lying on sofa on back; sleeping on left side, turning over in bed Relieving factors: lying prone pressing up with hands (learned in PT years ago and still does them 1x/week 20 reps; sitting shorter periods; walk around short distances  PRECAUTIONS: fall risk    WEIGHT  BEARING RESTRICTIONS: No  FALLS:  Yes 3-4x in the last 6 months; fell 3 nights ago when she got up to go to the bathroom, toe slipped and hit back on the toilet, light bruise mid thoracic region left   LIVING ENVIRONMENT: Stairs: bedroom downstairs (avoids takes longer than it used to  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   PLOF: Independent  PATIENT GOALS: I hope the pain is gone;  I would like to walk more, ride a bicycle, sleep more  NEXT MD VISIT: call as needed  OBJECTIVE:  Note: Objective measures were completed at Evaluation unless otherwise noted.  DIAGNOSTIC FINDINGS:   11/02/2021  RI LUMBAR SPINE WITHOUT CONTRAST   TECHNIQUE: Multiplanar, multisequence MR imaging of the lumbar spine was performed. No intravenous contrast was administered.   COMPARISON:  X-ray lumbar 10/11/2021.   FINDINGS: Segmentation: Transitional lumbosacral vertebra numbered S1. This places hypoplastic ribs or unfused transverse processes at L1 by prior radiography.   Alignment:  Negative for listhesis or significant scoliosis   Vertebrae: No fracture, evidence of discitis, or aggressive bone lesion.   Conus medullaris and cauda equina: Conus extends to the L2 level. Conus and cauda equina appear normal.   Paraspinal and other soft tissues: Negative. 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   Disc levels:   T12- L1: Minor disc bulging   L1-L2: Mild disc bulging with small right paracentral protrusion   L2-L3: Minor disc bulging   L3-L4: Mild disc bulging   L4-L5: Disc space narrowing and bulging with small right paracentral protrusion. Mild facet spurring   L5-S1:Disc narrowing and mild bulging.  Negative facets.   IMPRESSION: Ordinary lumbar spine degeneration with widely patent canal  and foramina throughout the lumbar spine.   Transitional vertebra numbered S1.   PATIENT SURVEYS:  FOTO current score 69%, projected 68%   pt's self ratings of physical performance is higher than expected with goal score lower than current score   COGNITION: Overall cognitive status: Within functional limits for tasks assessed     MUSCLE LENGTH: Hamstrings: Right 65 deg; Left 60 deg Thomas test: Right 10 deg; Left 5 deg  POSTURE: decreased lumbar lordosis  PALPATION: No tender points identified today  LUMBAR ROM: able  to do a partial squat to pick up small object from the floor  AROM eval  Flexion 70   Extension 20 painful  Right lateral flexion 15  Left lateral flexion 25  Right rotation   Left rotation    (Blank rows = not tested)  TRUNK STRENGTH:  Decreased activation of transverse abdominus muscles; abdominals 4-/5; decreased activation of lumbar multifidi; trunk extensors 4-/5  LOWER EXTREMITY ROM:   Grossly WFLS, some hip stiffness with internal and external rotation  LOWER EXTREMITY MMT:  Able to rise sit to stand without UE use Poor balance with SLS (needs UE support):  right/left pelvic drop indicating glute medius weakness left more than rigth   MMT Right eval Left eval  Hip flexion 4 4  Hip extension 4- 4-  Hip abduction 3+ 3-  Hip adduction    Hip internal rotation    Hip external rotation 4- 3+  Knee flexion 4 4  Knee extension 4 4-  Ankle dorsiflexion    Ankle plantarflexion    Ankle inversion    Ankle eversion     (Blank rows = not tested)  LUMBAR SPECIAL TESTS:  Straight leg raise test: Negative  FUNCTIONAL TESTS:  5x STS no hands 34.47 TUG 16.03 3 mwt 598 WIDE BASE OF SUPPORT,HEAVIER LEFT STRIKE 09/02/2023 5STS:20.16sec No UE support TUG:12.20 sec : 471ft : 952 ft; RPE 5 GAIT:  Comments: wide base of support, decreased gait speed, pelvic drop  TODAY'S TREATMENT:                                                                                                                               DATE:  12/16: Stability ball stretch x 10 forwards 5STS:20.16sec No UE support TUG:12.20 sec : 459ft : 952 ft; RPE 5 Seated hamstring stretch 2 x 30 sec bilateral  Seated TA activation + hip flexion x 10 bilateral  Seated trunk extension with magenta long loop 2 x 10 Hooklying hip abduction x 20 Bridging 2 x 8 Alt hand and knee press x 10 bilateral  Supine LTR x 8 each direction 5 sec hold   12/13: Nu-Step seat 7 blue machine L5 6 min Supine LTR x 8 each direction 5 sec hold Seated hamstring stretch 2 x 30 sec bilateral  TA contraction + hip flexion  with yellow loop x 10 bilateral  Supine red band clams 2 x10 Seated trunk extension with long pink loop Supine yellow band clams 2 x10 TA contraction + SLR x 8 each - increased back pain Manual: Addaday to bilateral lumbar paraspinals for improved circulation. PT present to monitor stauts Seated Hip flexion & LAQ 2# AW 2 x 10 bilateral  Sit to Stand x 8 no UE support     12/10: Nu-Step seat 7 blue machine L5 5 min Supine LTR x 8 each direction 5 sec hold TA contraction + hip flexion x 10 bilateral  Supine hand to knee isometric push  x8 Supine red band clams 2 x10 Seated 4# yellow plyo ball: hip to hip x10; opp shoulder to hip x 10 bilateral  Sit to Stand x 8 no UE support Seated shoulder rows red band x10 Standing extension red band x10 Seated knee flexion with green TB x 15 bilateral  Seated LAQ 2# AW x 10 bilateral    08/23/23: Pt arrives for aquatic physical therapy. Treatment took place in 3.5-5.5 feet of water. Water temperature was. Pt entered the pool via stairs slowly and step to step with rails. Pt requires buoyancy of water for support and to offload joints with strengthening exercises.  Seated water bench with 75% submersion Pt performed seated LE AROM exercises 20x in all planes, with concurrent verbal education on water principles and how we  would use them. Pt verbally understood and previously (about a year ago did water walking [facility] the [facility].) 75% water walking 4x in each direction with 1-2 min seated rest break in between directions. Seated UE arm movements 10x. High knee marching holding blue noodle 2 lengths, slowly and wobbly in SLS. Horizontal decompression float with 100% flotation for pain management and to decrease CNS input.    PATIENT EDUCATION:  Education details: Educated patient on anatomy and physiology of current symptoms, prognosis, plan of care as well as initial self care strategies to promote recovery Person educated: Patient Education method: Explanation Education comprehension: verbalized understanding  HOME EXERCISE PROGRAM: Access Code: [email or web address] URL: [email or web address] Date: 08/02/2023 Prepared by: Lavinia Sharps 12/3:  Limit to 5 reps with HEP secondary to flare ups following ex Exercises - Sit to Stand  - 1 x daily - 7 x weekly - 1 sets - 10 reps - Hooklying Clamshell with Resistance  - 1 x daily - 7 x weekly - 1 sets - 10 reps - Hooklying Isometric Hip Flexion with Opposite Arm  - 1 x daily - 7 x weekly - 1 sets - 10 reps - Seated Diagonal Chop with Medicine Ball  - 1 x daily - 7 x weekly - 4 sets - 5 reps - Standing Row with Anchored Resistance  - 1 x daily - 7 x weekly - 1 sets - 10 reps  ASSESSMENT:  CLINICAL IMPRESSION:  Reassessed functional tests today. Completed 6 MWT and Chales Abrahams walked 952 ft. With TUG and 5STS noted improvements in times compared to evaluation. She had moderate increased in back pain when performing supine bridges that decreased after altering mechanics. She tolerated treatment session well and was able to perform all exercises. Patient will benefit from skilled PT to address the below impairments and improve overall function.    OBJECTIVE IMPAIRMENTS: decreased activity tolerance, difficulty walking, decreased ROM, decreased strength, impaired  perceived functional ability, and pain.   ACTIVITY LIMITATIONS: sitting, sleeping, stairs, bed mobility, and locomotion level  PARTICIPATION LIMITATIONS: interpersonal relationship, community activity, and walking longer distances, spending time with granddaughter  PERSONAL FACTORS: 3+ comorbidities: fibromyalgia, frequent falls, time since onset, tremors, HTN, multi jt OA  are also affecting patient's functional outcome.   REHAB POTENTIAL: Good  CLINICAL DECISION MAKING: Stable/uncomplicated  EVALUATION COMPLEXITY: Low   GOALS: Goals reviewed with patient? Yes  SHORT TERM GOALS: Target date: 08/28/2023    The patient will demonstrate knowledge of basic self care strategies and exercises to promote healing  Baseline: Goal status: INITIAL  2.  25% improvement in sleep reported /decreased back pain Baseline:  Goal status: INITIAL  3.  The patient will have improved hip  strength to at least 4-/5 needed for standing, walking longer distances and descending stairs at home and in the community  Baseline:  Goal status: INITIAL  4.  Improved LE and trunk strength with greater ease with sit to stand 5x in <25 sec Baseline:  Goal status: MET 09/02/2023   LONG TERM GOALS: Target date: 09/25/2023    The patient will be independent in a safe self progression of a home exercise program to promote further recovery of function  Baseline:  Goal status: INITIAL  2.  50% improvement in sleep/ due to decreased back pain Baseline:  Goal status: INITIAL  3.  The patient will have improved hip strength to at least 4/5 needed for standing, walking longer distances and descending stairs at home and in the community  Baseline:  Goal status: INITIAL  4.  The patient will increase walking program from 2x/week to 3-4x/week Baseline:  Goal status: INITIAL  5.  The patient will have improved trunk flexor and extensor muscle strength needed for lifting medium weight objects such as grocery  bags, laundry and luggage  Baseline:  Goal status: INITIAL    PLAN:  PT FREQUENCY: 2x/week  PT DURATION: 8 weeks  PLANNED INTERVENTIONS: 97164- PT Re-evaluation, 97110-Therapeutic exercises, 97530- Therapeutic activity, 97112- Neuromuscular re-education, 97535- Self Care, 91478- Manual therapy, [id number]- Aquatic Therapy, 97014- Electrical stimulation (unattended), [id number]- Electrical stimulation (manual), [id number]- Ultrasound, [id number]- Traction (mechanical), [id number]- Ionotophoresis 4mg /ml Dexamethasone, Patient/Family education, Taping, Dry Needling, Joint mobilization, Spinal mobilization, Cryotherapy, and Moist heat.  PLAN FOR NEXT SESSION: assess tolerance to treatment session; standing LE strengthening; step ups   Claude Manges, PT 09/02/23 1:19 PM

## 2023-09-03 ENCOUNTER — Encounter: Payer: Medicare Other | Admitting: Physical Therapy

## 2023-09-03 IMAGING — DX DG ABDOMEN 2V
3 series · 3 of 3 positions shown · non-contrast
Comparison: Lumbar spine and pelvis/hip radiographs 10/11/2021

CLINICAL DATA: Chronic diarrhea.  Fecal incontinence.

EXAM:
ABDOMEN - 2 VIEW

[abdomen erect]
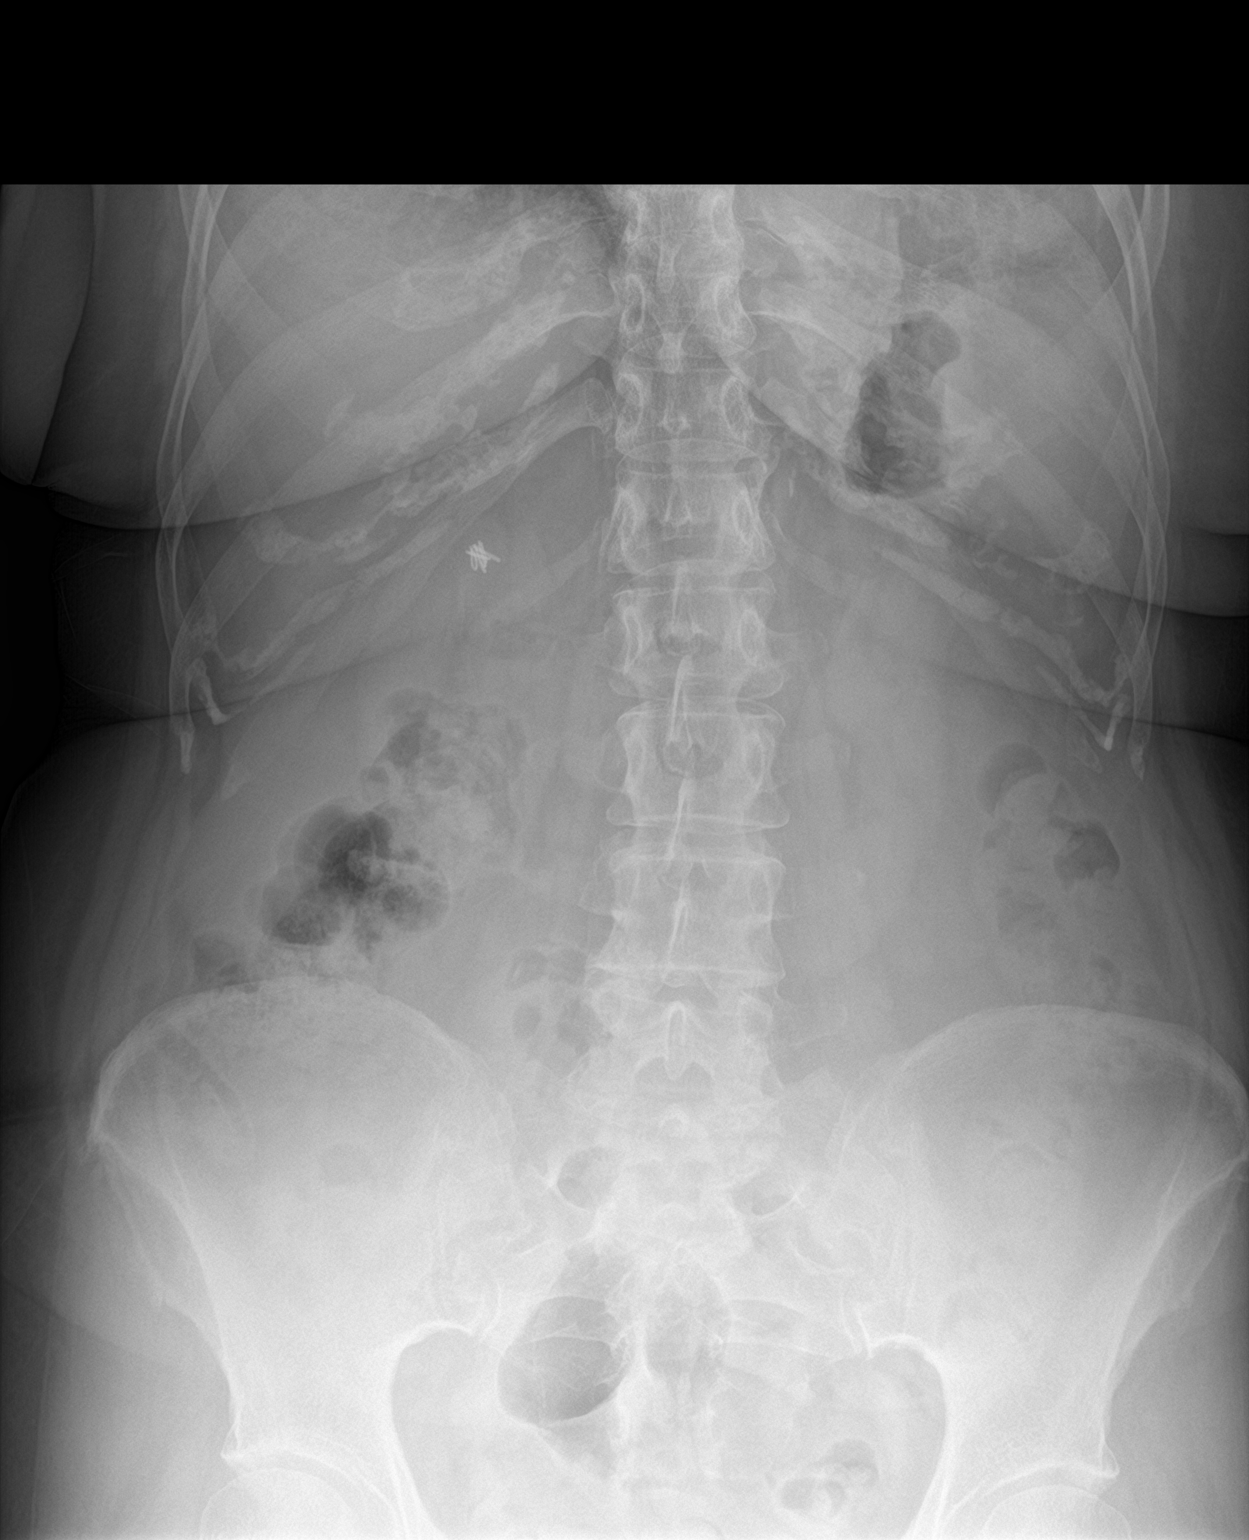

[abdomen supine (1 of 2)]
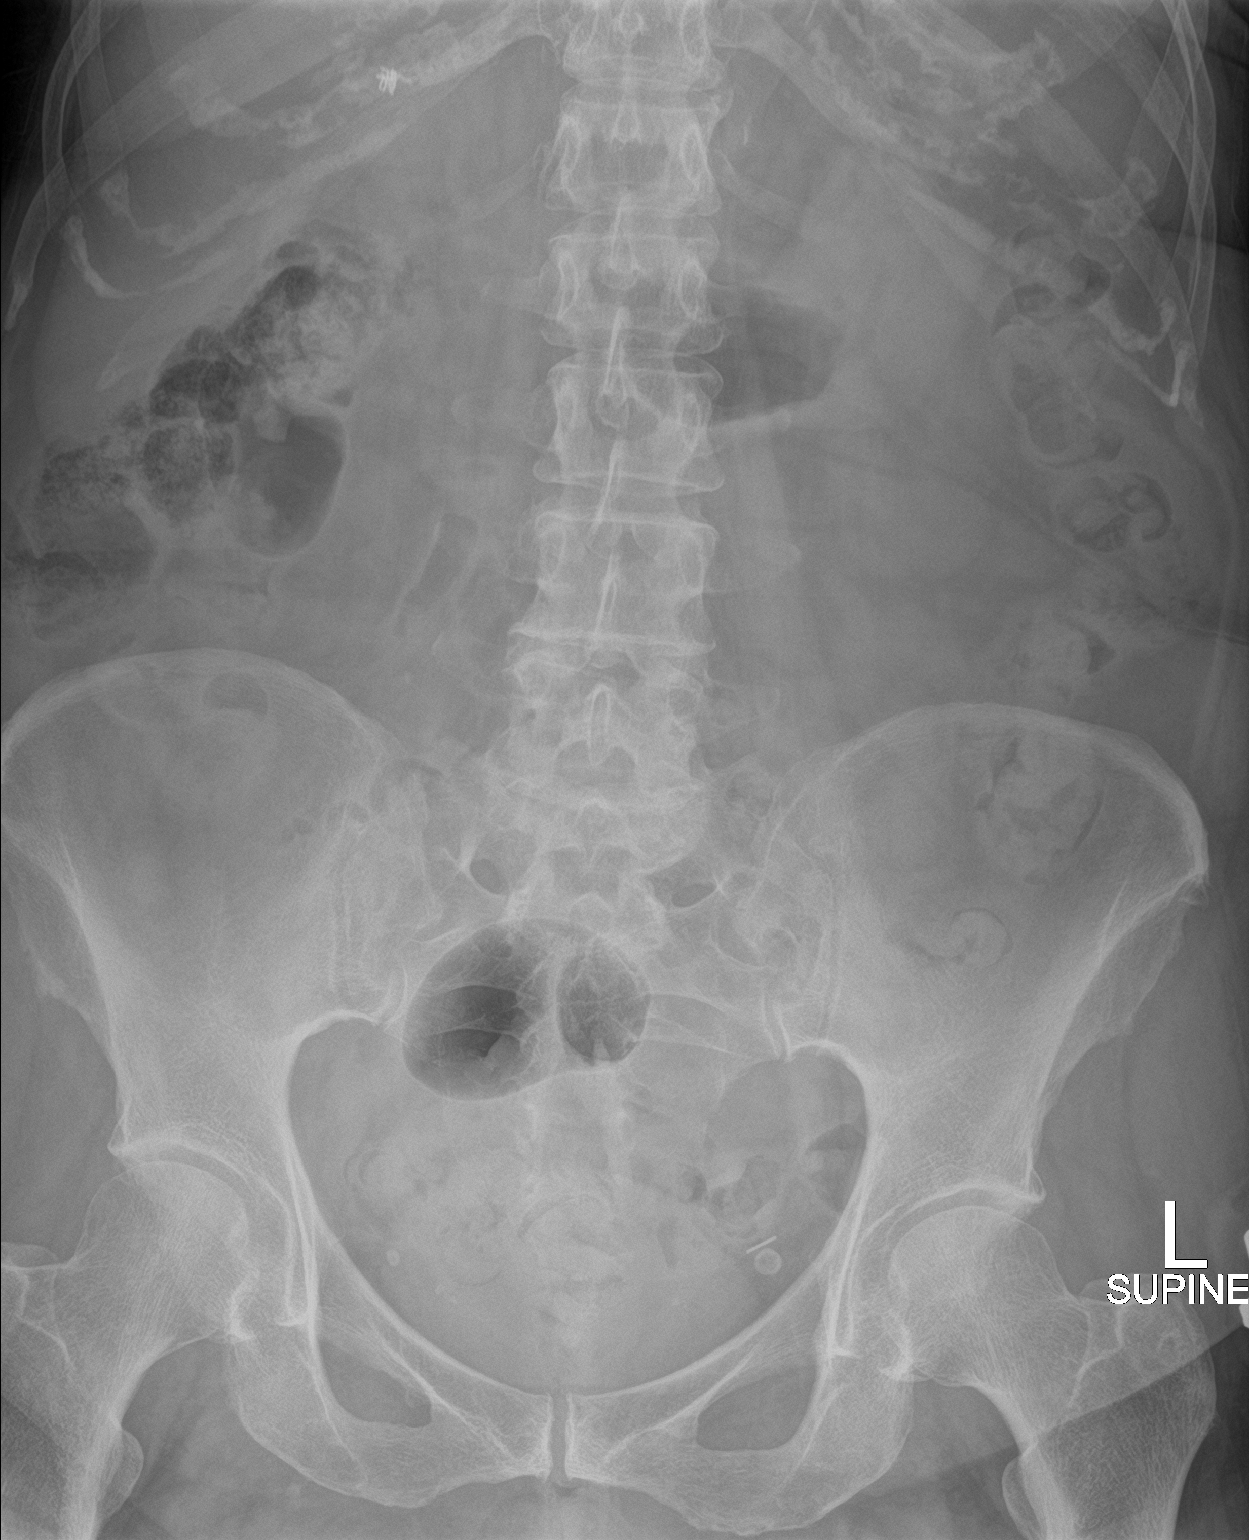

[abdomen supine (2 of 2)]
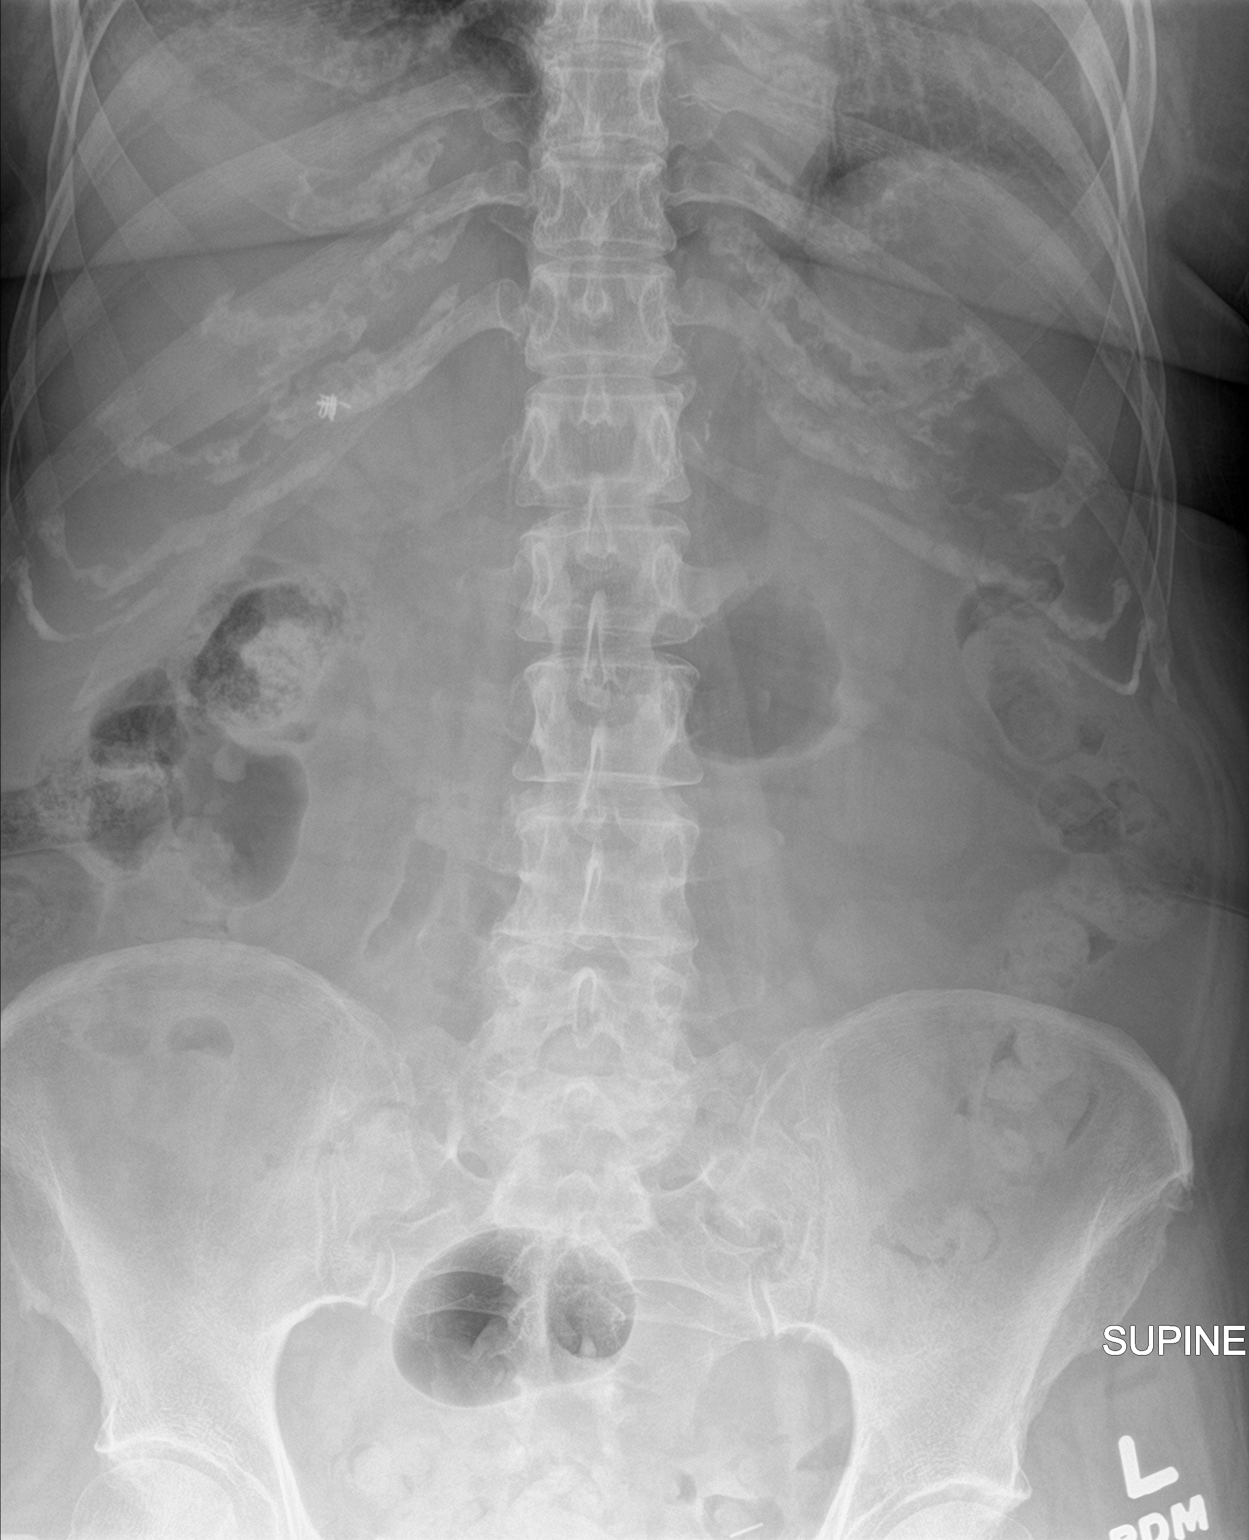

[3 of 3 positions shown; findings below may reference images not displayed]

FINDINGS: Stool is seen within the ascending, proximal transverse and
descending colon. Overall paucity of small bowel gas. No dilated
loops of bowel to indicate bowel obstruction. No portal venous gas
or pneumatosis is seen. No subdiaphragmatic free air on upright
view. Vascular phleboliths overlie the pelvis. A surgical clip again
overlies the left hemipelvis. Cholecystectomy clips.

No acute skeletal abnormality.
IMPRESSION: Nonobstructed bowel-gas pattern.  Mild stool burden.

## 2023-09-03 NOTE — Therapy (Unsigned)
OUTPATIENT PHYSICAL THERAPY THORACOLUMBAR TREATMENT   Patient Name: Mariah Snyder MRN: [id number] DOB:[birth year], 77 y.o., female Today's Date: 09/04/2023  END OF SESSION:  PT End of Session - 09/04/23 1133     Visit Number 9    Date for PT Re-Evaluation 09/25/23    Authorization Type Medicare    Progress Note Due on Visit 10    PT Start Time 1016    PT Stop Time 1055    PT Time Calculation (min) 39 min    Activity Tolerance Patient tolerated treatment well    Behavior During Therapy WFL for tasks assessed/performed                   Past Medical History:  Diagnosis Date   Anemia    Anxiety    Arthritis    Asthma    Fibromyalgia    Gallstones    GERD (gastroesophageal reflux disease)    Hyperlipidemia    Hypertension    Hypothyroidism    Kidney stones    Peptic ulcer    Rectal prolapse    Sleep apnea    Urinary tract infection    Past Surgical History:  Procedure Laterality Date   bunyionectomy Left    CHOLECYSTECTOMY     ELBOW ARTHROSCOPY Left    LAPAROSCOPIC ASSISTED VAGINAL HYSTERECTOMY     SHOULDER ARTHROSCOPY Right    TONSILLECTOMY     Patient Active Problem List   Diagnosis Date Noted   Pain due to onychomycosis of toenails of both feet 07/15/2023   Constipation 09/14/2022   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/2021   Left hip pain 10/12/2021   High cholesterol 03/07/2021   Moderate persistent asthma without complication 11/01/2020   Bereavement 02/06/2019   Bilateral temporomandibular joint pain 02/18/2018   Right leg numbness 05/21/2017   Squamous blepharitis of upper and lower eyelids of both eyes 05/21/2017   Meibomian gland dysfunction (MGD) of upper and lower lids of both eyes 02/20/2017   Chronic left-sided low back pain with left-sided sciatica 12/31/2016   OAB (overactive bladder) 08/29/2015   Hallux limitus 01/24/2015   Balance problems 05/12/2013   Lichen sclerosus 05/12/2013   Healthcare maintenance 01/12/2013   Essential  tremor 07/08/2012   Gastro-esophageal reflux disease with esophagitis 07/02/2011   Fibromyalgia 05/27/2011   Keratoconjunctivitis sicca 05/10/2011   Senile nuclear sclerosis 05/10/2011   Tear film insufficiency 05/10/2011   Essential (primary) hypertension 02/26/2011   Degenerative spinal arthritis 08/28/2000   Hashimoto's thyroiditis 08/28/1980    PCP: Hillard Danker MD  REFERRING PROVIDER: Hillard Danker MD  REFERRING DIAG: [id number], G89.29 chronic left sided low back pain with left sided sciatica  Rationale for Evaluation and Treatment: Rehabilitation  THERAPY DIAG:  Back pain; weakness ONSET DATE: 6 months  SUBJECTIVE:  SUBJECTIVE STATEMENT: Pool felt wonderful and that day was a very good day for me. A lot of pain last night effecting my sleep.  PERTINENT HISTORY:  Pelvic floor PT with Sallyanne Havers had DN Fibromyalgia (hard to get out of bed in the morning); history of HTN, arthritis, asthma, chronic constipation, hypothyroidism; sleep apnea, essential tremor being treated with Botox (1st round didn't work)  overactive bladder, hysterectomy, and cholecystectomy   Volunteers at the hospital  Able to walk 2 miles 2x/week  Pt states she has been "lazy" about ex in the past  PAIN: 09/02/2023  Are you having pain? Yes NPRS scale: 4/10  Pain location: cervical today Pain orientation: Left and Lower  PAIN TYPE: sharp Pain description: intermittent and sharp  Aggravating factors: sitting long periods, lying on sofa on back; sleeping on left side, turning over in bed Relieving factors: lying prone pressing up with hands (learned in PT years ago and still does them 1x/week 20 reps; sitting shorter periods; walk around short distances  PRECAUTIONS: fall risk    WEIGHT BEARING RESTRICTIONS:  No  FALLS:  Yes 3-4x in the last 6 months; fell 3 nights ago when she got up to go to the bathroom, toe slipped and hit back on the toilet, light bruise mid thoracic region left   LIVING ENVIRONMENT: Stairs: bedroom downstairs (avoids takes longer than it used to  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   PLOF: Independent  PATIENT GOALS: I hope the pain is gone;  I would like to walk more, ride a bicycle, sleep more  NEXT MD VISIT: call as needed  OBJECTIVE:  Note: Objective measures were completed at Evaluation unless otherwise noted.  DIAGNOSTIC FINDINGS:   11/02/2021  RI LUMBAR SPINE WITHOUT CONTRAST   TECHNIQUE: Multiplanar, multisequence MR imaging of the lumbar spine was performed. No intravenous contrast was administered.   COMPARISON:  X-ray lumbar 10/11/2021.   FINDINGS: Segmentation: Transitional lumbosacral vertebra numbered S1. This places hypoplastic ribs or unfused transverse processes at L1 by prior radiography.   Alignment:  Negative for listhesis or significant scoliosis   Vertebrae: No fracture, evidence of discitis, or aggressive bone lesion.   Conus medullaris and cauda equina: Conus extends to the L2 level. Conus and cauda equina appear normal.   Paraspinal and other soft tissues: Negative. 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   Disc levels:   T12- L1: Minor disc bulging   L1-L2: Mild disc bulging with small right paracentral protrusion   L2-L3: Minor disc bulging   L3-L4: Mild disc bulging   L4-L5: Disc space narrowing and bulging with small right paracentral protrusion. Mild facet spurring   L5-S1:Disc narrowing and mild bulging.  Negative facets.   IMPRESSION: Ordinary lumbar spine degeneration with widely patent canal and foramina throughout the  lumbar spine.   Transitional vertebra numbered S1.   PATIENT SURVEYS:  FOTO current score 69%, projected 68%   pt's self ratings of physical performance is higher than expected with goal score lower than current score   COGNITION: Overall cognitive status: Within functional limits for tasks assessed     MUSCLE LENGTH: Hamstrings: Right 65 deg; Left 60 deg Thomas test: Right 10 deg; Left 5 deg  POSTURE: decreased lumbar lordosis  PALPATION: No tender points identified today  LUMBAR ROM: able to do  a partial squat to pick up small object from the floor  AROM eval  Flexion 70   Extension 20 painful  Right lateral flexion 15  Left lateral flexion 25  Right rotation   Left rotation    (Blank rows = not tested)  TRUNK STRENGTH:  Decreased activation of transverse abdominus muscles; abdominals 4-/5; decreased activation of lumbar multifidi; trunk extensors 4-/5  LOWER EXTREMITY ROM:   Grossly WFLS, some hip stiffness with internal and external rotation  LOWER EXTREMITY MMT:  Able to rise sit to stand without UE use Poor balance with SLS (needs UE support):  right/left pelvic drop indicating glute medius weakness left more than rigth   MMT Right eval Left eval  Hip flexion 4 4  Hip extension 4- 4-  Hip abduction 3+ 3-  Hip adduction    Hip internal rotation    Hip external rotation 4- 3+  Knee flexion 4 4  Knee extension 4 4-  Ankle dorsiflexion    Ankle plantarflexion    Ankle inversion    Ankle eversion     (Blank rows = not tested)  LUMBAR SPECIAL TESTS:  Straight leg raise test: Negative  FUNCTIONAL TESTS:  5x STS no hands 34.47 TUG 16.03 3 mwt 598 WIDE BASE OF SUPPORT,HEAVIER LEFT STRIKE 09/02/2023 5STS:20.16sec No UE support TUG:12.20 sec : 474ft : 952 ft; RPE 5 GAIT:  Comments: wide base of support, decreased gait speed, pelvic drop  TODAY'S TREATMENT:                                                                                                                               DATE:   09/04/23:Pt arrives for aquatic physical therapy. Treatment took place in 3.5-5.5 feet of water. Water temperature was. Pt entered the pool via stairs slowly and step to step with rails. Pt requires buoyancy of water for support and to offload joints with strengthening exercises.  Seated water bench with 75% submersion Pt performed seated LE AROM exercises 20x in all planes, with concurrent review of status. 75% water walking 6x in each direction with no rest break in between directions. Seated UE arm movements 10x. High knee marching holding blue noodle 2 lengths, much less wobbly in SLS. Hip 3 ways Bil 10x with UE support on wall.  Horizontal decompression float with 100% flotation for pain management and to decrease CNS input.   12/16: Stability ball stretch x 10 forwards 5STS:20.16sec No UE support TUG:12.20 sec : 460ft : 952 ft; RPE 5 Seated hamstring stretch 2 x 30 sec bilateral  Seated TA activation + hip flexion x 10 bilateral  Seated trunk extension with magenta long loop 2 x 10 Hooklying hip abduction x 20 Bridging 2 x 8 Alt hand and knee press x 10 bilateral  Supine LTR x 8 each direction 5 sec hold   12/13: Nu-Step seat 7 blue machine L5 6 min Supine LTR x 8 each direction 5 sec  hold Seated hamstring stretch 2 x 30 sec bilateral  TA contraction + hip flexion  with yellow loop x 10 bilateral  Supine red band clams 2 x10 Seated trunk extension with long pink loop Supine yellow band clams 2 x10 TA contraction + SLR x 8 each - increased back pain Manual: Addaday to bilateral lumbar paraspinals for improved circulation. PT present to monitor stauts Seated Hip flexion & LAQ 2# AW 2 x 10 bilateral  Sit to Stand x 8 no UE support     PATIENT EDUCATION:  Education details: Educated patient on anatomy and physiology of current symptoms, prognosis, plan of care as well as initial self care strategies to promote recovery Person  educated: Patient Education method: Explanation Education comprehension: verbalized understanding  HOME EXERCISE PROGRAM: Access Code: [email or web address] URL: [email or web address] Date: 08/02/2023 Prepared by: Lavinia Sharps 12/3:  Limit to 5 reps with HEP secondary to flare ups following ex Exercises - Sit to Stand  - 1 x daily - 7 x weekly - 1 sets - 10 reps - Hooklying Clamshell with Resistance  - 1 x daily - 7 x weekly - 1 sets - 10 reps - Hooklying Isometric Hip Flexion with Opposite Arm  - 1 x daily - 7 x weekly - 1 sets - 10 reps - Seated Diagonal Chop with Medicine Ball  - 1 x daily - 7 x weekly - 4 sets - 5 reps - Standing Row with Anchored Resistance  - 1 x daily - 7 x weekly - 1 sets - 10 reps  ASSESSMENT:  CLINICAL IMPRESSION: Pt had excellent results from first pool session, having almost no pain for the rest of that day. Pt did not need to stop and rest with water walking today and she increased her overall exercise volume with good tolerance.   OBJECTIVE IMPAIRMENTS: decreased activity tolerance, difficulty walking, decreased ROM, decreased strength, impaired perceived functional ability, and pain.   ACTIVITY LIMITATIONS: sitting, sleeping, stairs, bed mobility, and locomotion level  PARTICIPATION LIMITATIONS: interpersonal relationship, community activity, and walking longer distances, spending time with granddaughter  PERSONAL FACTORS: 3+ comorbidities: fibromyalgia, frequent falls, time since onset, tremors, HTN, multi jt OA  are also affecting patient's functional outcome.   REHAB POTENTIAL: Good  CLINICAL DECISION MAKING: Stable/uncomplicated  EVALUATION COMPLEXITY: Low   GOALS: Goals reviewed with patient? Yes  SHORT TERM GOALS: Target date: 08/28/2023    The patient will demonstrate knowledge of basic self care strategies and exercises to promote healing  Baseline: Goal status: INITIAL  2.  25% improvement in sleep reported /decreased back  pain Baseline:  Goal status: INITIAL  3.  The patient will have improved hip strength to at least 4-/5 needed for standing, walking longer distances and descending stairs at home and in the community  Baseline:  Goal status: INITIAL  4.  Improved LE and trunk strength with greater ease with sit to stand 5x in <25 sec Baseline:  Goal status: MET 09/02/2023   LONG TERM GOALS: Target date: 09/25/2023    The patient will be independent in a safe self progression of a home exercise program to promote further recovery of function  Baseline:  Goal status: INITIAL  2.  50% improvement in sleep/ due to decreased back pain Baseline:  Goal status: INITIAL  3.  The patient will have improved hip strength to at least 4/5 needed for standing, walking longer distances and descending stairs at home and in the community  Baseline:  Goal status: INITIAL  4.  The patient will increase walking program from 2x/week to 3-4x/week Baseline:  Goal status: INITIAL  5.  The patient will have improved trunk flexor and extensor muscle strength needed for lifting medium weight objects such as grocery bags, laundry and luggage  Baseline:  Goal status: INITIAL    PLAN:  PT FREQUENCY: 2x/week  PT DURATION: 8 weeks  PLANNED INTERVENTIONS: 97164- PT Re-evaluation, 97110-Therapeutic exercises, 97530- Therapeutic activity, 97112- Neuromuscular re-education, 97535- Self Care, 16109- Manual therapy, [id number]- Aquatic Therapy, 97014- Electrical stimulation (unattended), [id number]- Electrical stimulation (manual), [id number]- Ultrasound, [id number]- Traction (mechanical), [id number]- Ionotophoresis 4mg /ml Dexamethasone, Patient/Family education, Taping, Dry Needling, Joint mobilization, Spinal mobilization, Cryotherapy, and Moist heat.  PLAN FOR NEXT SESSION: assess tolerance to treatment session; standing LE strengthening; step ups   Ane Payment, PTA 09/04/23 11:34 AM

## 2023-09-04 ENCOUNTER — Encounter: Payer: Medicare Other | Admitting: Physical Therapy

## 2023-09-04 ENCOUNTER — Ambulatory Visit: Payer: Medicare Other | Admitting: Physical Therapy

## 2023-09-04 ENCOUNTER — Encounter: Payer: Self-pay | Admitting: Physical Therapy

## 2023-09-04 DIAGNOSIS — M5442 Lumbago with sciatica, left side: Secondary | ICD-10-CM | POA: Diagnosis not present

## 2023-09-04 DIAGNOSIS — R279 Unspecified lack of coordination: Secondary | ICD-10-CM

## 2023-09-04 DIAGNOSIS — R262 Difficulty in walking, not elsewhere classified: Secondary | ICD-10-CM

## 2023-09-04 DIAGNOSIS — R293 Abnormal posture: Secondary | ICD-10-CM

## 2023-09-04 DIAGNOSIS — G8929 Other chronic pain: Secondary | ICD-10-CM

## 2023-09-04 DIAGNOSIS — M6281 Muscle weakness (generalized): Secondary | ICD-10-CM

## 2023-09-06 ENCOUNTER — Ambulatory Visit: Payer: Medicare Other | Admitting: Neurology

## 2023-09-06 ENCOUNTER — Ambulatory Visit (INDEPENDENT_AMBULATORY_CARE_PROVIDER_SITE_OTHER): Payer: Medicare Other | Admitting: Neurology

## 2023-09-06 DIAGNOSIS — G243 Spasmodic torticollis: Secondary | ICD-10-CM | POA: Diagnosis not present

## 2023-09-06 MED ORDER — ONABOTULINUMTOXINA 100 UNITS IJ SOLR
200.0000 [IU] | Freq: Once | INTRAMUSCULAR | Status: AC
  Administered 2023-09-06: 200 [IU] via INTRAMUSCULAR

## 2023-09-06 MED ORDER — ONABOTULINUMTOXINA 100 UNITS IJ SOLR: 100.0000 [IU] | Freq: Once | INTRAMUSCULAR | Status: DC

## 2023-09-06 NOTE — Procedures (Unsigned)
[facility]   Procedure Note Botox  Attending: Dr. Lurena Joiner Kaprice Kage  Preoperative Diagnosis(es): Cervical Dystonia  Result History  Wasn't sure that helpful.  May change pattern dose next time and increase dose.  Will call in few weeks to see how did  Consent obtained from: The patient Benefits discussed included, but were not limited to decreased muscle tightness, increased joint range of motion, and decreased pain.  Risk discussed included, but were not limited pain and discomfort, bleeding, bruising, excessive weakness, venous thrombosis, muscle atrophy and dysphagia.  A copy of the patient medication guide was given to the patient which explains the blackbox warning.  Patients identity and treatment sites confirmed Yes.  .  Details of Procedure: Skin was cleaned with alcohol.  A 30 gauge, 25mm  needle was introduced to the target muscle, except for posterior splenius where 27 gauge, 1.5 inch needle used.   Prior to injection, the needle plunger was aspirated to make sure the needle was not within a blood vessel.  There was no blood retrieved on aspiration.    Following is a summary of the muscles injected  And the amount of Botulinum toxin used:   Dilution 0.9% preservative free saline mixed with 100 u Botox type A to make 10 U per 0.1cc  Injections  Location Left  Right Units Number of sites        Sternocleidomastoid 50  50 1  Splenius Capitus, posterior approach  80 80 1  Splenius Capitus, lateral approach  30 30 1         Trapezius 20/20  40 2        TOTAL UNITS:   200    Agent: Botulinum Type A ( Onobotulinum Toxin type A ).  2 vials of Botox were used, each containing 100 units and freshly diluted with 1 mL of sterile, non-preserved saline   Total injected (Units): 200  Total wasted (Units): none wasted   Pt tolerated procedure well without complications.   Reinjection is anticipated in 3 months.

## 2023-09-10 ENCOUNTER — Ambulatory Visit: Payer: Medicare Other | Admitting: Physical Therapy

## 2023-09-10 DIAGNOSIS — G8929 Other chronic pain: Secondary | ICD-10-CM

## 2023-09-10 DIAGNOSIS — M6281 Muscle weakness (generalized): Secondary | ICD-10-CM

## 2023-09-10 DIAGNOSIS — M5442 Lumbago with sciatica, left side: Secondary | ICD-10-CM | POA: Diagnosis not present

## 2023-09-10 DIAGNOSIS — R293 Abnormal posture: Secondary | ICD-10-CM

## 2023-09-10 NOTE — Patient Instructions (Addendum)
RE-ALIGNMENT ROUTINE EXERCISES-OSTEOPROROSIS BASIC FOR POSTURAL CORRECTION   RE-ALIGNMENT Tips BENEFITS: 1.It helps to re-align the curves of the back and improve standing posture. 2.It allows the back muscles to rest and strengthen in preparation for more activity. FREQUENCY: Daily, even after weeks, months and years of more advanced exercises. START: 1.All exercises start in the same position: lying on the back, arms resting on the supporting surface, palms up and slightly away from the body, backs of hands down, knees bent, feet flat. 2.The head, neck, arms, and legs are supported according to specific instructions of your therapist. Copyright  [employer]. All rights reserved.    1. Decompression Exercise: Basic.   Takes compression off the vertebral bodies; increases tolerance for lying on the back; helps relieve back pain   Lie on back on firm surface, knees bent, feet flat, arms turned up, out to sides (~35 degrees). Head neck and arms supported as necessary. Time _5-15__ minutes. Surface: floor     2. Shoulder Press  Strengthens upper back extensors and scapular retractors.   Press both shoulders down. Hold _2-3__ seconds. Repeat _3-5__ times. Surface: floor        3. Head Press With Chin Tuck  Strengthens neck extensors   Tuck chin SLIGHTLY toward chest, keep mouth closed. Feel weight on back of head. Increase weight by pressing head down. Hold _2-3__ seconds. Relax. Repeat 3-5___ times. Surface: floor     4. Leg Lengthener: stretches quadratus lumborum and hip flexors.  Strengthens quads and ankle dorsiflexors.  Leg Lengthener: Full    Straighten one leg. Pull toes AND forefoot toward knee, extend heel. Lengthen leg by pulling pelvis away from ribs. Hold __5_ seconds. Relax. Repeat 1 time. Re-bend knee. Do other leg. Each leg __5_ times. Surface: floor    Leg Lengthener / Leg Press Combo: Single Leg    Straighten one leg down to floor. Pull toes AND forefoot  toward knee; extend heel. Lengthen leg by pulling pelvis away from ribs. Press leg down. DO NOT BEND KNEE. Hold _5__ seconds. Relax leg. Repeat exercise 1 time. Relax leg. Re-bend knee. Repeat with other leg. Do 5 times   Trigger Point Dry Needling  What is Trigger Point Dry Needling (DN)? DN is a physical therapy technique used to treat muscle pain and dysfunction. Specifically, DN helps deactivate muscle trigger points (muscle knots).  A thin filiform needle is used to penetrate the skin and stimulate the underlying trigger point. The goal is for a local twitch response (LTR) to occur and for the trigger point to relax. No medication of any kind is injected during the procedure.   What Does Trigger Point Dry Needling Feel Like?  The procedure feels different for each individual patient. Some patients report that they do not actually feel the needle enter the skin and overall the process is not painful. Very mild bleeding may occur. However, many patients feel a deep cramping in the muscle in which the needle was inserted. This is the local twitch response.   How Will I feel after the treatment? Soreness is normal, and the onset of soreness may not occur for a few hours. Typically this soreness does not last longer than two days.  Bruising is uncommon, however; ice can be used to decrease any possible bruising.  In rare cases feeling tired or nauseous after the treatment is normal. In addition, your symptoms may get worse before they get better, this period will typically not last longer than 24 hours.  What Can I do After My Treatment? Increase your hydration by drinking more water for the next 24 hours. You may place ice or heat on the areas treated that have become sore, however, do not use heat on inflamed or bruised areas. Heat often brings more relief post needling. You can continue your regular activities, but vigorous activity is not recommended initially after the treatment for 24  hours. DN is best combined with other physical therapy such as strengthening, stretching, and other therapies.

## 2023-09-10 NOTE — Therapy (Signed)
OUTPATIENT PHYSICAL THERAPY THORACOLUMBAR TREATMENT   Patient Name: Mariah Snyder MRN: [id number] DOB:[birth year], 77 y.o., female Today's Date: 09/10/2023   Progress Note Reporting Period 07/31/23 to 09/10/23  See note below for Objective Data and Assessment of Progress/Goals.     END OF SESSION:  PT End of Session - 09/10/23 1105     Visit Number 10    Date for PT Re-Evaluation 09/25/23    Authorization Type Medicare    Progress Note Due on Visit 20    PT Start Time 1102    PT Stop Time 1145    PT Time Calculation (min) 43 min    Activity Tolerance Patient tolerated treatment well                  Past Medical History:  Diagnosis Date   Anemia    Anxiety    Arthritis    Asthma    Fibromyalgia    Gallstones    GERD (gastroesophageal reflux disease)    Hyperlipidemia    Hypertension    Hypothyroidism    Kidney stones    Peptic ulcer    Rectal prolapse    Sleep apnea    Urinary tract infection    Past Surgical History:  Procedure Laterality Date   bunyionectomy Left    CHOLECYSTECTOMY     ELBOW ARTHROSCOPY Left    LAPAROSCOPIC ASSISTED VAGINAL HYSTERECTOMY     SHOULDER ARTHROSCOPY Right    TONSILLECTOMY     Patient Active Problem List   Diagnosis Date Noted   Pain due to onychomycosis of toenails of both feet 07/15/2023   Constipation 09/14/2022   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/2021   Left hip pain 10/12/2021   High cholesterol 03/07/2021   Moderate persistent asthma without complication 11/01/2020   Bereavement 02/06/2019   Bilateral temporomandibular joint pain 02/18/2018   Right leg numbness 05/21/2017   Squamous blepharitis of upper and lower eyelids of both eyes 05/21/2017   Meibomian gland dysfunction (MGD) of upper and lower lids of both eyes 02/20/2017   Chronic left-sided low back pain with left-sided sciatica 12/31/2016   OAB (overactive bladder) 08/29/2015   Hallux limitus 01/24/2015   Balance problems 05/12/2013   Lichen  sclerosus 05/12/2013   Healthcare maintenance 01/12/2013   Essential tremor 07/08/2012   Gastro-esophageal reflux disease with esophagitis 07/02/2011   Fibromyalgia 05/27/2011   Keratoconjunctivitis sicca 05/10/2011   Senile nuclear sclerosis 05/10/2011   Tear film insufficiency 05/10/2011   Essential (primary) hypertension 02/26/2011   Degenerative spinal arthritis 08/28/2000   Hashimoto's thyroiditis 08/28/1980    PCP: Hillard Danker MD  REFERRING PROVIDER: Hillard Danker MD  REFERRING DIAG: [phone], G89.29 chronic left sided low back pain with left sided sciatica  Rationale for Evaluation and Treatment: Rehabilitation  THERAPY DIAG:  Back pain; weakness ONSET DATE: 6 months  SUBJECTIVE:  SUBJECTIVE STATEMENT:  The water therapist wants me to come more at the pool. I get relief the rest of the day!  Left sided back pain.  Woke up about a 10.  I've been moving and it's back down to a regular level 4/10.  I haven't walked as much since it's been cold.  I walk my dog 4x/day though. I always do the prone press ups and up and down from the chair 5 reps twice.  I do the band rows and to the side to side 2x/week.  PERTINENT HISTORY:  Pelvic floor PT with Sallyanne Havers had DN Fibromyalgia (hard to get out of bed in the morning); history of HTN, arthritis, asthma, chronic constipation, hypothyroidism; sleep apnea, essential tremor being treated with Botox (1st round didn't work)  overactive bladder, hysterectomy, and cholecystectomy   Volunteers at the hospital  Able to walk 2 miles 2x/week  Pt states she has been "lazy" about ex in the past  PAIN: 09/02/2023  Are you having pain? Yes NPRS scale: 4/10  Pain location: cervical today Pain orientation: Left and Lower  PAIN TYPE: sharp Pain  description: intermittent and sharp  Aggravating factors: sitting long periods, lying on sofa on back; sleeping on left side, turning over in bed Relieving factors: lying prone pressing up with hands (learned in PT years ago and still does them 1x/week 20 reps; sitting shorter periods; walk around short distances  PRECAUTIONS: fall risk    WEIGHT BEARING RESTRICTIONS: No  FALLS:  Yes 3-4x in the last 6 months; fell 3 nights ago when she got up to go to the bathroom, toe slipped and hit back on the toilet, light bruise mid thoracic region left   LIVING ENVIRONMENT: Stairs: bedroom downstairs (avoids takes longer than it used to  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   PLOF: Independent  PATIENT GOALS: I hope the pain is gone;  I would like to walk more, ride a bicycle, sleep more  NEXT MD VISIT: call as needed  OBJECTIVE:  Note: Objective measures were completed at Evaluation unless otherwise noted.  DIAGNOSTIC FINDINGS:   11/02/2021  RI LUMBAR SPINE WITHOUT CONTRAST   TECHNIQUE: Multiplanar, multisequence MR imaging of the lumbar spine was performed. No intravenous contrast was administered.   COMPARISON:  X-ray lumbar 10/11/2021.   FINDINGS: Segmentation: Transitional lumbosacral vertebra numbered S1. This places hypoplastic ribs or unfused transverse processes at L1 by prior radiography.   Alignment:  Negative for listhesis or significant scoliosis   Vertebrae: No fracture, evidence of discitis, or aggressive bone lesion.   Conus medullaris and cauda equina: Conus extends to the L2 level. Conus and cauda equina appear normal.   Paraspinal and other soft tissues: Negative. 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   Disc levels:   T12- L1: Minor disc bulging   L1-L2: Mild disc  bulging with small right paracentral protrusion   L2-L3: Minor disc bulging   L3-L4: Mild disc bulging   L4-L5: Disc space narrowing and bulging with small right paracentral protrusion. Mild facet spurring   L5-S1:Disc narrowing and mild bulging.  Negative facets.   IMPRESSION: Ordinary lumbar spine degeneration with widely patent canal and foramina throughout the lumbar spine.   Transitional vertebra numbered S1.   PATIENT SURVEYS:  FOTO current score 69%, projected 68%  pt's self ratings of physical performance is higher than expected with goal score lower than current score   COGNITION: Overall cognitive status: Within functional limits for tasks assessed     MUSCLE LENGTH: Hamstrings: Right 65 deg; Left 60 deg Thomas test: Right 10 deg; Left 5 deg  POSTURE: decreased lumbar lordosis  PALPATION: No tender points identified today  LUMBAR ROM: able to do a partial squat to pick up small object from the floor  AROM eval  Flexion 70   Extension 20 painful  Right lateral flexion 15  Left lateral flexion 25  Right rotation   Left rotation    (Blank rows = not tested)  TRUNK STRENGTH:  Decreased activation of transverse abdominus muscles; abdominals 4-/5; decreased activation of lumbar multifidi; trunk extensors 4-/5  LOWER EXTREMITY ROM:   Grossly WFLS, some hip stiffness with internal and external rotation  LOWER EXTREMITY MMT:  Able to rise sit to stand without UE use Poor balance with SLS (needs UE support):  right/left pelvic drop indicating glute medius weakness left more than rigth   MMT Right eval Left eval 12/24  Hip flexion 4 4 4   Hip extension 4- 4- 4- bil  Hip abduction 3+ 3- 4- Bil  Hip adduction     Hip internal rotation     Hip external rotation 4- 3+ 4- bil  Knee flexion 4 4 4  bil  Knee extension 4 4- 4 bil  Ankle dorsiflexion     Ankle plantarflexion     Ankle inversion     Ankle eversion      (Blank rows = not tested)  LUMBAR SPECIAL  TESTS:  Straight leg raise test: Negative  FUNCTIONAL TESTS:  5x STS no hands 34.47 TUG 16.03 3 mwt 598 WIDE BASE OF SUPPORT,HEAVIER LEFT STRIKE 09/02/2023 5STS:20.16sec No UE support TUG:12.20 sec : 47ft : 952 ft; RPE 5 GAIT:  Comments: wide base of support, decreased gait speed, pelvic drop  TODAY'S TREATMENT:                                                                                                                              DATE:  12/24: Nu-Step L1 6 min Discussion of modalities and their roll in helping to improve mobility and strength (Addaday, U/S, DN) Decompression series (added to HEP): diaphragmatic breathing; head push into pillow, shoulder press down, whole leg press down, leg lengthener 5x each, 5 sec hold right/left Manual therapy: soft tissue mobilization to lumbar paraspinals Trigger Point Dry-Needling  Treatment instructions: Expect mild to moderate muscle soreness. S/S of pneumothorax if dry needled over a lung field, and to seek immediate medical attention should they occur. Patient verbalized understanding of these instructions and education.  Patient Consent Given: Yes Education handout provided: Yes Muscles treated: bil lumbar multifidi L2-S1 marination Electrical stimulation performed: No Parameters: N/A Treatment response/outcome: improved paraspinal and fascial mobility    12/16: Stability ball stretch x 10 forwards 5STS:20.16sec No  UE support TUG:12.20 sec : 454ft : 952 ft; RPE 5 Seated hamstring stretch 2 x 30 sec bilateral  Seated TA activation + hip flexion x 10 bilateral  Seated trunk extension with magenta long loop 2 x 10 Hooklying hip abduction x 20 Bridging 2 x 8 Alt hand and knee press x 10 bilateral  Supine LTR x 8 each direction 5 sec hold   12/13: Nu-Step seat 7 blue machine L5 6 min Supine LTR x 8 each direction 5 sec hold Seated hamstring stretch 2 x 30 sec bilateral  TA contraction + hip flexion   with yellow loop x 10 bilateral  Supine red band clams 2 x10 Seated trunk extension with long pink loop Supine yellow band clams 2 x10 TA contraction + SLR x 8 each - increased back pain Manual: Addaday to bilateral lumbar paraspinals for improved circulation. PT present to monitor stauts Seated Hip flexion & LAQ 2# AW 2 x 10 bilateral  Sit to Stand x 8 no UE support     12/10: Nu-Step seat 7 blue machine L5 5 min Supine LTR x 8 each direction 5 sec hold TA contraction + hip flexion x 10 bilateral  Supine hand to knee isometric push x8 Supine red band clams 2 x10 Seated 4# yellow plyo ball: hip to hip x10; opp shoulder to hip x 10 bilateral  Sit to Stand x 8 no UE support Seated shoulder rows red band x10 Standing extension red band x10 Seated knee flexion with green TB x 15 bilateral  Seated LAQ 2# AW x 10 bilateral      PATIENT EDUCATION:  Education details: Educated patient on anatomy and physiology of current symptoms, prognosis, plan of care as well as initial self care strategies to promote recovery;  DN info Person educated: Patient Education method: Explanation Education comprehension: verbalized understanding  HOME EXERCISE PROGRAM: Decompression series see pt instructions Access Code: [email or web address] URL: [email or web address] Date: 08/02/2023 Prepared by: Lavinia Sharps 12/3:  Limit to 5 reps with HEP secondary to flare ups following ex Exercises - Sit to Stand  - 1 x daily - 7 x weekly - 1 sets - 10 reps - Hooklying Clamshell with Resistance  - 1 x daily - 7 x weekly - 1 sets - 10 reps - Hooklying Isometric Hip Flexion with Opposite Arm  - 1 x daily - 7 x weekly - 1 sets - 10 reps - Seated Diagonal Chop with Medicine Ball  - 1 x daily - 7 x weekly - 4 sets - 5 reps - Standing Row with Anchored Resistance  - 1 x daily - 7 x weekly - 1 sets - 10 reps  ASSESSMENT:  CLINICAL IMPRESSION:  Tim Sevy has been limited in exercise tolerance in the clinic with  excessive delayed onset muscle soreness therefore we initiated aquatic PT a few weeks ago with a very positive response.  Since her spine is load sensitive, she was instructed in a supine decompression series and was provided with a handout for home replication.  The patient also had a positive initial response to DN with much improved soft tissue mobility noted following.  Therapist monitoring response and educating patient on what to expect following DN and how to optimize benefit with specific exercise.  Anticipate pain intensity and mobility will continue to improve over the next few days.  She has made significant improvements in 5x STS and TUG tests since start of care.  The patient would benefit from a continuation of skilled  PT for a further progression of strengthening and functional mobility.  Will continue to update and promote independence in a HEP needed for a return to the highest functional level possible with ADLs.      OBJECTIVE IMPAIRMENTS: decreased activity tolerance, difficulty walking, decreased ROM, decreased strength, impaired perceived functional ability, and pain.   ACTIVITY LIMITATIONS: sitting, sleeping, stairs, bed mobility, and locomotion level  PARTICIPATION LIMITATIONS: interpersonal relationship, community activity, and walking longer distances, spending time with granddaughter  PERSONAL FACTORS: 3+ comorbidities: fibromyalgia, frequent falls, time since onset, tremors, HTN, multi jt OA  are also affecting patient's functional outcome.   REHAB POTENTIAL: Good  CLINICAL DECISION MAKING: Stable/uncomplicated  EVALUATION COMPLEXITY: Low   GOALS: Goals reviewed with patient? Yes  SHORT TERM GOALS: Target date: 08/28/2023    The patient will demonstrate knowledge of basic self care strategies and exercises to promote healing  Baseline: Goal status: met 12/24  2.  25% improvement in sleep reported /decreased back pain Baseline:  Goal status: ongoing  3.   The patient will have improved hip strength to at least 4-/5 needed for standing, walking longer distances and descending stairs at home and in the community  Baseline:  Goal status: met 12/24  4.  Improved LE and trunk strength with greater ease with sit to stand 5x in <25 sec Baseline:  Goal status: MET 09/02/2023   LONG TERM GOALS: Target date: 09/25/2023    The patient will be independent in a safe self progression of a home exercise program to promote further recovery of function  Baseline:  Goal status: INITIAL  2.  50% improvement in sleep/ due to decreased back pain Baseline:  Goal status: INITIAL  3.  The patient will have improved hip strength to at least 4/5 needed for standing, walking longer distances and descending stairs at home and in the community  Baseline:  Goal status: INITIAL  4.  The patient will increase walking program from 2x/week to 3-4x/week Baseline:  Goal status: INITIAL  5.  The patient will have improved trunk flexor and extensor muscle strength needed for lifting medium weight objects such as grocery bags, laundry and luggage  Baseline:  Goal status: INITIAL    PLAN:  PT FREQUENCY: 2x/week  PT DURATION: 8 weeks  PLANNED INTERVENTIONS: 97164- PT Re-evaluation, 97110-Therapeutic exercises, 97530- Therapeutic activity, 97112- Neuromuscular re-education, 97535- Self Care, [id number]- Manual therapy, [id number]- Aquatic Therapy, 97014- Electrical stimulation (unattended), [id number]- Electrical stimulation (manual), [id number]- Ultrasound, [id number]- Traction (mechanical), [id number]- Ionotophoresis 4mg /ml Dexamethasone, Patient/Family education, Taping, Dry Needling, Joint mobilization, Spinal mobilization, Cryotherapy, and Moist heat.  PLAN FOR NEXT SESSION: do FOTO next land visit;  assess tolerance to treatment session; standing LE strengthening; step ups; added to wait list for aquatic PT   Lavinia Sharps, PT 09/10/23 12:01 PM Phone: [phone] Fax:  [phone]

## 2023-09-12 ENCOUNTER — Encounter: Payer: Medicare Other | Admitting: Physical Therapy

## 2023-09-13 ENCOUNTER — Encounter: Payer: Self-pay | Admitting: Physical Therapy

## 2023-09-13 ENCOUNTER — Ambulatory Visit: Payer: Medicare Other | Admitting: Physical Therapy

## 2023-09-13 DIAGNOSIS — M6281 Muscle weakness (generalized): Secondary | ICD-10-CM

## 2023-09-13 DIAGNOSIS — G8929 Other chronic pain: Secondary | ICD-10-CM

## 2023-09-13 DIAGNOSIS — R279 Unspecified lack of coordination: Secondary | ICD-10-CM

## 2023-09-13 DIAGNOSIS — R293 Abnormal posture: Secondary | ICD-10-CM

## 2023-09-13 DIAGNOSIS — R262 Difficulty in walking, not elsewhere classified: Secondary | ICD-10-CM

## 2023-09-13 DIAGNOSIS — M5442 Lumbago with sciatica, left side: Secondary | ICD-10-CM | POA: Diagnosis not present

## 2023-09-13 NOTE — Therapy (Unsigned)
OUTPATIENT PHYSICAL THERAPY THORACOLUMBAR TREATMENT   Patient Name: Mariah Snyder MRN: [id number] DOB:1946-08-15, 77 y.o., female Today's Date: 09/13/2023   Progress Note Reporting Period 07/31/23 to 09/10/23  See note below for Objective Data and Assessment of Progress/Goals.     END OF SESSION:  PT End of Session - 09/13/23 1422     Visit Number 11    Date for PT Re-Evaluation 09/25/23    Authorization Type Medicare    Progress Note Due on Visit 20    PT Start Time 1345    PT Stop Time 1425    PT Time Calculation (min) 40 min    Activity Tolerance Patient tolerated treatment well    Behavior During Therapy WFL for tasks assessed/performed                  Past Medical History:  Diagnosis Date   Anemia    Anxiety    Arthritis    Asthma    Fibromyalgia    Gallstones    GERD (gastroesophageal reflux disease)    Hyperlipidemia    Hypertension    Hypothyroidism    Kidney stones    Peptic ulcer    Rectal prolapse    Sleep apnea    Urinary tract infection    Past Surgical History:  Procedure Laterality Date   bunyionectomy Left    CHOLECYSTECTOMY     ELBOW ARTHROSCOPY Left    LAPAROSCOPIC ASSISTED VAGINAL HYSTERECTOMY     SHOULDER ARTHROSCOPY Right    TONSILLECTOMY     Patient Active Problem List   Diagnosis Date Noted   Pain due to onychomycosis of toenails of both feet 07/15/2023   Constipation 09/14/2022   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/2021   Left hip pain 10/12/2021   High cholesterol 03/07/2021   Moderate persistent asthma without complication 11/01/2020   Bereavement 02/06/2019   Bilateral temporomandibular joint pain 02/18/2018   Right leg numbness 05/21/2017   Squamous blepharitis of upper and lower eyelids of both eyes 05/21/2017   Meibomian gland dysfunction (MGD) of upper and lower lids of both eyes 02/20/2017   Chronic left-sided low back pain with left-sided sciatica 12/31/2016   OAB (overactive bladder) 08/29/2015   Hallux  limitus 01/24/2015   Balance problems 05/12/2013   Lichen sclerosus 05/12/2013   Healthcare maintenance 01/12/2013   Essential tremor 07/08/2012   Gastro-esophageal reflux disease with esophagitis 07/02/2011   Fibromyalgia 05/27/2011   Keratoconjunctivitis sicca 05/10/2011   Senile nuclear sclerosis 05/10/2011   Tear film insufficiency 05/10/2011   Essential (primary) hypertension 02/26/2011   Degenerative spinal arthritis 08/28/2000   Hashimoto's thyroiditis 08/28/1980    PCP: Hillard Danker MD  REFERRING PROVIDER: Hillard Danker MD  REFERRING DIAG: [phone], G89.29 chronic left sided low back pain with left sided sciatica  Rationale for Evaluation and Treatment: Rehabilitation  THERAPY DIAG:  Back pain; weakness ONSET DATE: 6 months  SUBJECTIVE:  SUBJECTIVE STATEMENT:  The water therapist wants me to come more at the pool. I get relief the rest of the day!  Left sided back pain.  Woke up about a 10.  I've been moving and it's back down to a regular level 4/10.  I haven't walked as much since it's been cold.  I walk my dog 4x/day though. I always do the prone press ups and up and down from the chair 5 reps twice.  I do the band rows and to the side to side 2x/week.  PERTINENT HISTORY:  Pelvic floor PT with Sallyanne Havers had DN Fibromyalgia (hard to get out of bed in the morning); history of HTN, arthritis, asthma, chronic constipation, hypothyroidism; sleep apnea, essential tremor being treated with Botox (1st round didn't work)  overactive bladder, hysterectomy, and cholecystectomy   Volunteers at the hospital  Able to walk 2 miles 2x/week  Pt states she has been "lazy" about ex in the past  PAIN: 09/02/2023  Are you having pain? Yes NPRS scale: 4/10  Pain location: cervical today Pain  orientation: Left and Lower  PAIN TYPE: sharp Pain description: intermittent and sharp  Aggravating factors: sitting long periods, lying on sofa on back; sleeping on left side, turning over in bed Relieving factors: lying prone pressing up with hands (learned in PT years ago and still does them 1x/week 20 reps; sitting shorter periods; walk around short distances  PRECAUTIONS: fall risk    WEIGHT BEARING RESTRICTIONS: No  FALLS:  Yes 3-4x in the last 6 months; fell 3 nights ago when she got up to go to the bathroom, toe slipped and hit back on the toilet, light bruise mid thoracic region left   LIVING ENVIRONMENT: Stairs: bedroom downstairs (avoids takes longer than it used to  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   PLOF: Independent  PATIENT GOALS: I hope the pain is gone;  I would like to walk more, ride a bicycle, sleep more  NEXT MD VISIT: call as needed  OBJECTIVE:  Note: Objective measures were completed at Evaluation unless otherwise noted.  DIAGNOSTIC FINDINGS:   11/02/2021  RI LUMBAR SPINE WITHOUT CONTRAST   TECHNIQUE: Multiplanar, multisequence MR imaging of the lumbar spine was performed. No intravenous contrast was administered.   COMPARISON:  X-ray lumbar 10/11/2021.   FINDINGS: Segmentation: Transitional lumbosacral vertebra numbered S1. This places hypoplastic ribs or unfused transverse processes at L1 by prior radiography.   Alignment:  Negative for listhesis or significant scoliosis   Vertebrae: No fracture, evidence of discitis, or aggressive bone lesion.   Conus medullaris and cauda equina: Conus extends to the L2 level. Conus and cauda equina appear normal.   Paraspinal and other soft tissues: Negative. 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   Disc levels:    T12- L1: Minor disc bulging   L1-L2: Mild disc bulging with small right paracentral protrusion   L2-L3: Minor disc bulging   L3-L4: Mild disc bulging   L4-L5: Disc space narrowing and bulging with small right paracentral protrusion. Mild facet spurring   L5-S1:Disc narrowing and mild bulging.  Negative facets.   IMPRESSION: Ordinary lumbar spine degeneration with widely patent canal and foramina throughout the lumbar spine.   Transitional vertebra numbered S1.   PATIENT SURVEYS:  FOTO current score 69%, projected 68%  pt's self ratings of physical performance is higher than expected with goal score lower than current score   COGNITION: Overall cognitive status: Within functional limits for tasks assessed     MUSCLE LENGTH: Hamstrings: Right 65 deg; Left 60 deg Thomas test: Right 10 deg; Left 5 deg  POSTURE: decreased lumbar lordosis  PALPATION: No tender points identified today  LUMBAR ROM: able to do a partial squat to pick up small object from the floor  AROM eval  Flexion 70   Extension 20 painful  Right lateral flexion 15  Left lateral flexion 25  Right rotation   Left rotation    (Blank rows = not tested)  TRUNK STRENGTH:  Decreased activation of transverse abdominus muscles; abdominals 4-/5; decreased activation of lumbar multifidi; trunk extensors 4-/5  LOWER EXTREMITY ROM:   Grossly WFLS, some hip stiffness with internal and external rotation  LOWER EXTREMITY MMT:  Able to rise sit to stand without UE use Poor balance with SLS (needs UE support):  right/left pelvic drop indicating glute medius weakness left more than rigth   MMT Right eval Left eval 12/24  Hip flexion 4 4 4   Hip extension 4- 4- 4- bil  Hip abduction 3+ 3- 4- Bil  Hip adduction     Hip internal rotation     Hip external rotation 4- 3+ 4- bil  Knee flexion 4 4 4  bil  Knee extension 4 4- 4 bil  Ankle dorsiflexion     Ankle plantarflexion     Ankle inversion     Ankle eversion       (Blank rows = not tested)  LUMBAR SPECIAL TESTS:  Straight leg raise test: Negative  FUNCTIONAL TESTS:  5x STS no hands 34.47 TUG 16.03 3 mwt 598 WIDE BASE OF SUPPORT,HEAVIER LEFT STRIKE 09/02/2023 5STS:20.16sec No UE support TUG:12.20 sec : 446ft : 952 ft; RPE 5 GAIT:  Comments: wide base of support, decreased gait speed, pelvic drop  TODAY'S TREATMENT:                                                                                                                              DATE:  12/24: Nu-Step L1 6 min Discussion of modalities and their roll in helping to improve mobility and strength (Addaday, U/S, DN) Decompression series (added to HEP): diaphragmatic breathing; head push into pillow, shoulder press down, whole leg press down, leg lengthener 5x each, 5 sec hold right/left Manual therapy: soft tissue mobilization to lumbar paraspinals Trigger Point Dry-Needling  Treatment instructions: Expect mild to moderate muscle soreness. S/S of pneumothorax if dry needled over a lung field, and to seek immediate medical attention should they occur. Patient verbalized understanding of these instructions and education.  Patient Consent Given: Yes Education handout provided: Yes Muscles treated: bil lumbar multifidi L2-S1 marination Electrical stimulation performed: No Parameters: N/A Treatment response/outcome: improved paraspinal and fascial mobility    12/16: Stability ball stretch x 10 forwards 5STS:20.16sec No  UE support TUG:12.20 sec : 471ft : 952 ft; RPE 5 Seated hamstring stretch 2 x 30 sec bilateral  Seated TA activation + hip flexion x 10 bilateral  Seated trunk extension with magenta long loop 2 x 10 Hooklying hip abduction x 20 Bridging 2 x 8 Alt hand and knee press x 10 bilateral  Supine LTR x 8 each direction 5 sec hold   12/13: Nu-Step seat 7 blue machine L5 6 min Supine LTR x 8 each direction 5 sec hold Seated hamstring stretch 2 x 30  sec bilateral  TA contraction + hip flexion  with yellow loop x 10 bilateral  Supine red band clams 2 x10 Seated trunk extension with long pink loop Supine yellow band clams 2 x10 TA contraction + SLR x 8 each - increased back pain Manual: Addaday to bilateral lumbar paraspinals for improved circulation. PT present to monitor stauts Seated Hip flexion & LAQ 2# AW 2 x 10 bilateral  Sit to Stand x 8 no UE support     12/10: Nu-Step seat 7 blue machine L5 5 min Supine LTR x 8 each direction 5 sec hold TA contraction + hip flexion x 10 bilateral  Supine hand to knee isometric push x8 Supine red band clams 2 x10 Seated 4# yellow plyo ball: hip to hip x10; opp shoulder to hip x 10 bilateral  Sit to Stand x 8 no UE support Seated shoulder rows red band x10 Standing extension red band x10 Seated knee flexion with green TB x 15 bilateral  Seated LAQ 2# AW x 10 bilateral      PATIENT EDUCATION:  Education details: Educated patient on anatomy and physiology of current symptoms, prognosis, plan of care as well as initial self care strategies to promote recovery;  DN info Person educated: Patient Education method: Explanation Education comprehension: verbalized understanding  HOME EXERCISE PROGRAM: Decompression series see pt instructions Access Code: [email or web address] URL: [email or web address] Date: 08/02/2023 Prepared by: Lavinia Sharps 12/3:  Limit to 5 reps with HEP secondary to flare ups following ex Exercises - Sit to Stand  - 1 x daily - 7 x weekly - 1 sets - 10 reps - Hooklying Clamshell with Resistance  - 1 x daily - 7 x weekly - 1 sets - 10 reps - Hooklying Isometric Hip Flexion with Opposite Arm  - 1 x daily - 7 x weekly - 1 sets - 10 reps - Seated Diagonal Chop with Medicine Ball  - 1 x daily - 7 x weekly - 4 sets - 5 reps - Standing Row with Anchored Resistance  - 1 x daily - 7 x weekly - 1 sets - 10 reps  ASSESSMENT:  CLINICAL IMPRESSION:  Mariah Snyder has been  limited in exercise tolerance in the clinic with excessive delayed onset muscle soreness therefore we initiated aquatic PT a few weeks ago with a very positive response.  Since her spine is load sensitive, she was instructed in a supine decompression series and was provided with a handout for home replication.  The patient also had a positive initial response to DN with much improved soft tissue mobility noted following.  Therapist monitoring response and educating patient on what to expect following DN and how to optimize benefit with specific exercise.  Anticipate pain intensity and mobility will continue to improve over the next few days.  She has made significant improvements in 5x STS and TUG tests since start of care.  The patient would benefit from a continuation of skilled  PT for a further progression of strengthening and functional mobility.  Will continue to update and promote independence in a HEP needed for a return to the highest functional level possible with ADLs.      OBJECTIVE IMPAIRMENTS: decreased activity tolerance, difficulty walking, decreased ROM, decreased strength, impaired perceived functional ability, and pain.   ACTIVITY LIMITATIONS: sitting, sleeping, stairs, bed mobility, and locomotion level  PARTICIPATION LIMITATIONS: interpersonal relationship, community activity, and walking longer distances, spending time with granddaughter  PERSONAL FACTORS: 3+ comorbidities: fibromyalgia, frequent falls, time since onset, tremors, HTN, multi jt OA  are also affecting patient's functional outcome.   REHAB POTENTIAL: Good  CLINICAL DECISION MAKING: Stable/uncomplicated  EVALUATION COMPLEXITY: Low   GOALS: Goals reviewed with patient? Yes  SHORT TERM GOALS: Target date: 08/28/2023    The patient will demonstrate knowledge of basic self care strategies and exercises to promote healing  Baseline: Goal status: met 12/24  2.  25% improvement in sleep reported /decreased back  pain Baseline:  Goal status: ongoing  3.  The patient will have improved hip strength to at least 4-/5 needed for standing, walking longer distances and descending stairs at home and in the community  Baseline:  Goal status: met 12/24  4.  Improved LE and trunk strength with greater ease with sit to stand 5x in <25 sec Baseline:  Goal status: MET 09/02/2023   LONG TERM GOALS: Target date: 09/25/2023    The patient will be independent in a safe self progression of a home exercise program to promote further recovery of function  Baseline:  Goal status: INITIAL  2.  50% improvement in sleep/ due to decreased back pain Baseline:  Goal status: INITIAL  3.  The patient will have improved hip strength to at least 4/5 needed for standing, walking longer distances and descending stairs at home and in the community  Baseline:  Goal status: INITIAL  4.  The patient will increase walking program from 2x/week to 3-4x/week Baseline:  Goal status: INITIAL  5.  The patient will have improved trunk flexor and extensor muscle strength needed for lifting medium weight objects such as grocery bags, laundry and luggage  Baseline:  Goal status: INITIAL    PLAN:  PT FREQUENCY: 2x/week  PT DURATION: 8 weeks  PLANNED INTERVENTIONS: 97164- PT Re-evaluation, 97110-Therapeutic exercises, 97530- Therapeutic activity, 97112- Neuromuscular re-education, 97535- Self Care, 84166- Manual therapy, [id number]- Aquatic Therapy, 97014- Electrical stimulation (unattended), [id number]- Electrical stimulation (manual), [id number]- Ultrasound, [id number]- Traction (mechanical), [id number]- Ionotophoresis 4mg /ml Dexamethasone, Patient/Family education, Taping, Dry Needling, Joint mobilization, Spinal mobilization, Cryotherapy, and Moist heat.  PLAN FOR NEXT SESSION: do FOTO next land visit;  assess tolerance to treatment session; standing LE strengthening; step ups; added to wait list for aquatic PT  Ane Payment, PTA 09/13/23 2:23  PM

## 2023-09-16 ENCOUNTER — Encounter: Payer: Self-pay | Admitting: Physical Therapy

## 2023-09-16 ENCOUNTER — Ambulatory Visit: Payer: Medicare Other | Admitting: Physical Therapy

## 2023-09-16 DIAGNOSIS — R293 Abnormal posture: Secondary | ICD-10-CM

## 2023-09-16 DIAGNOSIS — R279 Unspecified lack of coordination: Secondary | ICD-10-CM

## 2023-09-16 DIAGNOSIS — M5442 Lumbago with sciatica, left side: Secondary | ICD-10-CM | POA: Diagnosis not present

## 2023-09-16 DIAGNOSIS — R262 Difficulty in walking, not elsewhere classified: Secondary | ICD-10-CM

## 2023-09-16 DIAGNOSIS — M6281 Muscle weakness (generalized): Secondary | ICD-10-CM

## 2023-09-16 DIAGNOSIS — G8929 Other chronic pain: Secondary | ICD-10-CM

## 2023-09-16 NOTE — Therapy (Signed)
OUTPATIENT PHYSICAL THERAPY THORACOLUMBAR TREATMENT   Patient Name: Mariah Snyder MRN: [id number] DOB:February 17, 1946, 77 y.o., female Today's Date: 09/16/2023    END OF SESSION:  PT End of Session - 09/16/23 1322     Visit Number 12    Date for PT Re-Evaluation 09/25/23    Authorization Type Medicare    Progress Note Due on Visit 20    PT Start Time 1235    PT Stop Time 1315    PT Time Calculation (min) 40 min    Activity Tolerance Patient tolerated treatment well    Behavior During Therapy WFL for tasks assessed/performed                   Past Medical History:  Diagnosis Date   Anemia    Anxiety    Arthritis    Asthma    Fibromyalgia    Gallstones    GERD (gastroesophageal reflux disease)    Hyperlipidemia    Hypertension    Hypothyroidism    Kidney stones    Peptic ulcer    Rectal prolapse    Sleep apnea    Urinary tract infection    Past Surgical History:  Procedure Laterality Date   bunyionectomy Left    CHOLECYSTECTOMY     ELBOW ARTHROSCOPY Left    LAPAROSCOPIC ASSISTED VAGINAL HYSTERECTOMY     SHOULDER ARTHROSCOPY Right    TONSILLECTOMY     Patient Active Problem List   Diagnosis Date Noted   Pain due to onychomycosis of toenails of both feet 07/15/2023   Constipation 09/14/2022   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/2021   Left hip pain 10/12/2021   High cholesterol 03/07/2021   Moderate persistent asthma without complication 11/01/2020   Bereavement 02/06/2019   Bilateral temporomandibular joint pain 02/18/2018   Right leg numbness 05/21/2017   Squamous blepharitis of upper and lower eyelids of both eyes 05/21/2017   Meibomian gland dysfunction (MGD) of upper and lower lids of both eyes 02/20/2017   Chronic left-sided low back pain with left-sided sciatica 12/31/2016   OAB (overactive bladder) 08/29/2015   Hallux limitus 01/24/2015   Balance problems 05/12/2013   Lichen sclerosus 05/12/2013   Healthcare maintenance 01/12/2013   Essential  tremor 07/08/2012   Gastro-esophageal reflux disease with esophagitis 07/02/2011   Fibromyalgia 05/27/2011   Keratoconjunctivitis sicca 05/10/2011   Senile nuclear sclerosis 05/10/2011   Tear film insufficiency 05/10/2011   Essential (primary) hypertension 02/26/2011   Degenerative spinal arthritis 08/28/2000   Hashimoto's thyroiditis 08/28/1980    PCP: Hillard Danker MD  REFERRING PROVIDER: Hillard Danker MD  REFERRING DIAG: [id number], G89.29 chronic left sided low back pain with left sided sciatica  Rationale for Evaluation and Treatment: Rehabilitation  THERAPY DIAG:  Back pain; weakness ONSET DATE: 6 months  SUBJECTIVE:  SUBJECTIVE STATEMENT: Patient reports she is doing okay today. She feels a lot better after her aquatic sessions compared to land therapy.   PERTINENT HISTORY:  Pelvic floor PT with Sallyanne Havers had DN Fibromyalgia (hard to get out of bed in the morning); history of HTN, arthritis, asthma, chronic constipation, hypothyroidism; sleep apnea, essential tremor being treated with Botox (1st round didn't work)  overactive bladder, hysterectomy, and cholecystectomy   Volunteers at the hospital  Able to walk 2 miles 2x/week  Pt states she has been "lazy" about ex in the past  PAIN: 09/16/2023  Are you having pain? 4/10 NPRS scale:   Pain location: cervical today Pain orientation: Left and Lower  PAIN TYPE: sharp Pain description: intermittent and sharp  Aggravating factors: sitting long periods, lying on sofa on back; sleeping on left side, turning over in bed Relieving factors: lying prone pressing up with hands (learned in PT years ago and still does them 1x/week 20 reps; sitting shorter periods; walk around short distances  PRECAUTIONS: fall risk    WEIGHT BEARING  RESTRICTIONS: No  FALLS:  Yes 3-4x in the last 6 months; fell 3 nights ago when she got up to go to the bathroom, toe slipped and hit back on the toilet, light bruise mid thoracic region left   LIVING ENVIRONMENT: Stairs: bedroom downstairs (avoids takes longer than it used to  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   PLOF: Independent  PATIENT GOALS: I hope the pain is gone;  I would like to walk more, ride a bicycle, sleep more  NEXT MD VISIT: call as needed  OBJECTIVE:  Note: Objective measures were completed at Evaluation unless otherwise noted.  DIAGNOSTIC FINDINGS:   11/02/2021  RI LUMBAR SPINE WITHOUT CONTRAST   TECHNIQUE: Multiplanar, multisequence MR imaging of the lumbar spine was performed. No intravenous contrast was administered.   COMPARISON:  X-ray lumbar 10/11/2021.   FINDINGS: Segmentation: Transitional lumbosacral vertebra numbered S1. This places hypoplastic ribs or unfused transverse processes at L1 by prior radiography.   Alignment:  Negative for listhesis or significant scoliosis   Vertebrae: No fracture, evidence of discitis, or aggressive bone lesion.   Conus medullaris and cauda equina: Conus extends to the L2 level. Conus and cauda equina appear normal.   Paraspinal and other soft tissues: Negative. 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   Disc levels:   T12- L1: Minor disc bulging   L1-L2: Mild disc bulging with small right paracentral protrusion   L2-L3: Minor disc bulging   L3-L4: Mild disc bulging   L4-L5: Disc space narrowing and bulging with small right paracentral protrusion. Mild facet spurring   L5-S1:Disc narrowing and mild bulging.  Negative facets.   IMPRESSION: Ordinary lumbar spine degeneration with widely patent canal and foramina  throughout the lumbar spine.   Transitional vertebra numbered S1.   PATIENT SURVEYS:  FOTO current score 69%, projected 68%   pt's self ratings of physical performance is higher than expected with goal score lower than current score FOTO: 09/16/2023- 52  COGNITION: Overall cognitive status: Within functional limits for tasks assessed     MUSCLE LENGTH: Hamstrings: Right 65 deg; Left 60 deg Thomas test: Right 10 deg; Left 5 deg  POSTURE: decreased lumbar lordosis  PALPATION: No tender points identified today  LUMBAR ROM: able to  do a partial squat to pick up small object from the floor  AROM eval  Flexion 70   Extension 20 painful  Right lateral flexion 15  Left lateral flexion 25  Right rotation   Left rotation    (Blank rows = not tested)  TRUNK STRENGTH:  Decreased activation of transverse abdominus muscles; abdominals 4-/5; decreased activation of lumbar multifidi; trunk extensors 4-/5  LOWER EXTREMITY ROM:   Grossly WFLS, some hip stiffness with internal and external rotation  LOWER EXTREMITY MMT:  Able to rise sit to stand without UE use Poor balance with SLS (needs UE support):  right/left pelvic drop indicating glute medius weakness left more than rigth   MMT Right eval Left eval 12/24  Hip flexion 4 4 4   Hip extension 4- 4- 4- bil  Hip abduction 3+ 3- 4- Bil  Hip adduction     Hip internal rotation     Hip external rotation 4- 3+ 4- bil  Knee flexion 4 4 4  bil  Knee extension 4 4- 4 bil  Ankle dorsiflexion     Ankle plantarflexion     Ankle inversion     Ankle eversion      (Blank rows = not tested)  LUMBAR SPECIAL TESTS:  Straight leg raise test: Negative  FUNCTIONAL TESTS:  5x STS no hands 34.47 TUG 16.03 3 mwt 598 WIDE BASE OF SUPPORT,HEAVIER LEFT STRIKE 09/02/2023 5STS:20.16sec No UE support TUG:12.20 sec : 441ft : 952 ft; RPE 5 GAIT:  Comments: wide base of support, decreased gait speed, pelvic drop  TODAY'S TREATMENT:                                                                                                                               DATE:  09/16/2023 Discussion of POC and when re-certification is done FOTO:52 NuStep L2 8 min- PT present to discuss status & schedule aquatics 3 way SB stretch x 8 each direction Seated hamstring stretch 2 x 30 sec bilateral  Seated trunk extension with magenta long loop 2 x 10 Seated alt hand and knee press x 12 each   09/13/23:Pt arrives for aquatic physical therapy. Treatment took place in 3.5-5.5 feet of water. Water temperature was. Pt entered the pool via stairs slowly and step to step with rails. Pt requires buoyancy of water for support and to offload joints with strengthening exercises.  Seated water bench with 75% submersion Pt performed seated LE AROM exercises 20x in all planes, with concurrent review of status. 75% water walking 8x in each direction with no rest break in between directions. Standing UE arm movements 10x with single buoy UE wts. High knee marching holding blue noodle 2 lengths, much less wobbly in SLS. Hip 3 ways Bil 15x with UE support on wall.  Horizontal decompression float with 100% flotation for pain management and to decrease CNS input.   12/24: Nu-Step L1 6 min Discussion of modalities and their roll in  helping to improve mobility and strength ([place], U/S, DN) Decompression series (added to HEP): diaphragmatic breathing; head push into pillow, shoulder press down, whole leg press down, leg lengthener 5x each, 5 sec hold right/left Manual therapy: soft tissue mobilization to lumbar paraspinals Trigger Point Dry-Needling  Treatment instructions: Expect mild to moderate muscle soreness. S/S of pneumothorax if dry needled over a lung field, and to seek immediate medical attention should they occur. Patient verbalized understanding of these instructions and education.  Patient Consent Given: Yes Education handout provided: Yes Muscles  treated: bil lumbar multifidi L2-S1 marination Electrical stimulation performed: No Parameters: N/A Treatment response/outcome: improved paraspinal and fascial mobility    12/16: Stability ball stretch x 10 forwards 5STS:20.16sec No UE support TUG:12.20 sec : 462ft : 952 ft; RPE 5 Seated hamstring stretch 2 x 30 sec bilateral  Seated TA activation + hip flexion x 10 bilateral  Seated trunk extension with magenta long loop 2 x 10 Hooklying hip abduction x 20 Bridging 2 x 8 Alt hand and knee press x 10 bilateral  Supine LTR x 8 each direction 5 sec hold      PATIENT EDUCATION:  Education details: Educated patient on anatomy and physiology of current symptoms, prognosis, plan of care as well as initial self care strategies to promote recovery;  DN info Person educated: Patient Education method: Explanation Education comprehension: verbalized understanding  HOME EXERCISE PROGRAM: Decompression series see pt instructions Access Code: [email or web address] URL: [email or web address] Date: 08/02/2023 Prepared by: Lavinia Sharps 12/3:  Limit to 5 reps with HEP secondary to flare ups following ex Exercises - Sit to Stand  - 1 x daily - 7 x weekly - 1 sets - 10 reps - Hooklying Clamshell with Resistance  - 1 x daily - 7 x weekly - 1 sets - 10 reps - Hooklying Isometric Hip Flexion with Opposite Arm  - 1 x daily - 7 x weekly - 1 sets - 10 reps - Seated Diagonal Chop with Medicine Ball  - 1 x daily - 7 x weekly - 4 sets - 5 reps - Standing Row with Anchored Resistance  - 1 x daily - 7 x weekly - 1 sets - 10 reps  ASSESSMENT:  CLINICAL IMPRESSION: Patient verbalizes great relief after her aquatic therapy sessions. She feels doing aquatics is a lot more beneficial for her compared to land therapy. Patient tolerated treatment session well and did not verbalize any increased pain. Patient required verbal and visual cues for correct exercise performance. Patient will benefit from  skilled PT to address the below impairments and improve overall function.     OBJECTIVE IMPAIRMENTS: decreased activity tolerance, difficulty walking, decreased ROM, decreased strength, impaired perceived functional ability, and pain.   ACTIVITY LIMITATIONS: sitting, sleeping, stairs, bed mobility, and locomotion level  PARTICIPATION LIMITATIONS: interpersonal relationship, community activity, and walking longer distances, spending time with granddaughter  PERSONAL FACTORS: 3+ comorbidities: fibromyalgia, frequent falls, time since onset, tremors, HTN, multi jt OA  are also affecting patient's functional outcome.   REHAB POTENTIAL: Good  CLINICAL DECISION MAKING: Stable/uncomplicated  EVALUATION COMPLEXITY: Low   GOALS: Goals reviewed with patient? Yes  SHORT TERM GOALS: Target date: 08/28/2023    The patient will demonstrate knowledge of basic self care strategies and exercises to promote healing  Baseline: Goal status: met 12/24  2.  25% improvement in sleep reported /decreased back pain Baseline:  Goal status: ongoing  3.  The patient will have improved hip strength to at least 4-/5 needed for  standing, walking longer distances and descending stairs at home and in the community  Baseline:  Goal status: met 12/24  4.  Improved LE and trunk strength with greater ease with sit to stand 5x in <25 sec Baseline:  Goal status: MET 09/02/2023   LONG TERM GOALS: Target date: 09/25/2023    The patient will be independent in a safe self progression of a home exercise program to promote further recovery of function  Baseline:  Goal status: INITIAL  2.  50% improvement in sleep/ due to decreased back pain Baseline:  Goal status: INITIAL  3.  The patient will have improved hip strength to at least 4/5 needed for standing, walking longer distances and descending stairs at home and in the community  Baseline:  Goal status: INITIAL  4.  The patient will increase walking  program from 2x/week to 3-4x/week Baseline:  Goal status: INITIAL  5.  The patient will have improved trunk flexor and extensor muscle strength needed for lifting medium weight objects such as grocery bags, laundry and luggage  Baseline:  Goal status: INITIAL    PLAN:  PT FREQUENCY: 2x/week  PT DURATION: 8 weeks  PLANNED INTERVENTIONS: 97164- PT Re-evaluation, 97110-Therapeutic exercises, 97530- Therapeutic activity, 97112- Neuromuscular re-education, 97535- Self Care, [id number]- Manual therapy, [id number]- Aquatic Therapy, 97014- Electrical stimulation (unattended), [id number]- Electrical stimulation (manual), [id number]- Ultrasound, [id number]- Traction (mechanical), [id number]- Ionotophoresis 4mg /ml Dexamethasone, Patient/Family education, Taping, Dry Needling, Joint mobilization, Spinal mobilization, Cryotherapy, and Moist heat.  PLAN FOR NEXT SESSION:  assess tolerance to treatment session; standing LE strengthening Claude Manges, PT 09/16/23 1:23 PM

## 2023-09-19 NOTE — Therapy (Signed)
 OUTPATIENT PHYSICAL THERAPY THORACOLUMBAR TREATMENT   Patient Name: Mariah Snyder MRN: [id number] DOB:1946-04-03, 78 y.o., female Today's Date: 09/20/2023    END OF SESSION:  PT End of Session - 09/20/23 1414     Visit Number 13    Date for PT Re-Evaluation 09/25/23    Authorization Type Medicare    Progress Note Due on Visit 20    PT Start Time 1300    PT Stop Time 1345    PT Time Calculation (min) 45 min    Activity Tolerance Patient tolerated treatment well    Behavior During Therapy WFL for tasks assessed/performed                    Past Medical History:  Diagnosis Date   Anemia    Anxiety    Arthritis    Asthma    Fibromyalgia    Gallstones    GERD (gastroesophageal reflux disease)    Hyperlipidemia    Hypertension    Hypothyroidism    Kidney stones    Peptic ulcer    Rectal prolapse    Sleep apnea    Urinary tract infection    Past Surgical History:  Procedure Laterality Date   bunyionectomy Left    CHOLECYSTECTOMY     ELBOW ARTHROSCOPY Left    LAPAROSCOPIC ASSISTED VAGINAL HYSTERECTOMY     SHOULDER ARTHROSCOPY Right    TONSILLECTOMY     Patient Active Problem List   Diagnosis Date Noted   Pain due to onychomycosis of toenails of both feet 07/15/2023   Constipation 09/14/2022   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/2021   Left hip pain 10/12/2021   High cholesterol 03/07/2021   Moderate persistent asthma without complication 11/01/2020   Bereavement 02/06/2019   Bilateral temporomandibular joint pain 02/18/2018   Right leg numbness 05/21/2017   Squamous blepharitis of upper and lower eyelids of both eyes 05/21/2017   Meibomian gland dysfunction (MGD) of upper and lower lids of both eyes 02/20/2017   Chronic left-sided low back pain with left-sided sciatica 12/31/2016   OAB (overactive bladder) 08/29/2015   Hallux limitus 01/24/2015   Balance problems 05/12/2013   Lichen sclerosus 05/12/2013   Healthcare maintenance 01/12/2013   Essential  tremor 07/08/2012   Gastro-esophageal reflux disease with esophagitis 07/02/2011   Fibromyalgia 05/27/2011   Keratoconjunctivitis sicca 05/10/2011   Senile nuclear sclerosis 05/10/2011   Tear film insufficiency 05/10/2011   Essential (primary) hypertension 02/26/2011   Degenerative spinal arthritis 08/28/2000   Hashimoto's thyroiditis 08/28/1980    PCP: Rollene Norris MD  REFERRING PROVIDER: Rollene Norris MD  REFERRING DIAG: [id number], G89.29 chronic left sided low back pain with left sided sciatica  Rationale for Evaluation and Treatment: Rehabilitation  THERAPY DIAG:  Back pain; weakness ONSET DATE: 6 months  SUBJECTIVE:  SUBJECTIVE STATEMENT:I continue to do very well, especially after my water exercises.   PERTINENT HISTORY:  Pelvic floor PT with Erminio had DN Fibromyalgia (hard to get out of bed in the morning); history of HTN, arthritis, asthma, chronic constipation, hypothyroidism; sleep apnea, essential tremor being treated with Botox  (1st round didn't work)  overactive bladder, hysterectomy, and cholecystectomy   Volunteers at the hospital  Able to walk 2 miles 2x/week  Pt states she has been lazy about ex in the past  PAIN: 09/16/2023  Are you having pain? 0/10 currently NPRS scale:   Pain location: cervical today Pain orientation: Left and Lower  PAIN TYPE: sharp Pain description: intermittent and sharp  Aggravating factors: sitting long periods, lying on sofa on back; sleeping on left side, turning over in bed Relieving factors: lying prone pressing up with hands (learned in PT years ago and still does them 1x/week 20 reps; sitting shorter periods; walk around short distances  PRECAUTIONS: fall risk    WEIGHT BEARING RESTRICTIONS: No  FALLS:  Yes 3-4x in the  last 6 months; fell 3 nights ago when she got up to go to the bathroom, toe slipped and hit back on the toilet, light bruise mid thoracic region left   LIVING ENVIRONMENT: Stairs: bedroom downstairs (avoids takes longer than it used to  OCCUPATION: retired from [employer], 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   PLOF: Independent  PATIENT GOALS: I hope the pain is gone;  I would like to walk more, ride a bicycle, sleep more  NEXT MD VISIT: call as needed  OBJECTIVE:  Note: Objective measures were completed at Evaluation unless otherwise noted.  DIAGNOSTIC FINDINGS:   11/02/2021  RI LUMBAR SPINE WITHOUT CONTRAST   TECHNIQUE: Multiplanar, multisequence MR imaging of the lumbar spine was performed. No intravenous contrast was administered.   COMPARISON:  X-ray lumbar 10/11/2021.   FINDINGS: Segmentation: Transitional lumbosacral vertebra numbered S1. This places hypoplastic ribs or unfused transverse processes at L1 by prior radiography.   Alignment:  Negative for listhesis or significant scoliosis   Vertebrae: No fracture, evidence of discitis, or aggressive bone lesion.   Conus medullaris and cauda equina: Conus extends to the L2 level. Conus and cauda equina appear normal.   Paraspinal and other soft tissues: Negative. 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   Disc levels:   T12- L1: Minor disc bulging   L1-L2: Mild disc bulging with small right paracentral protrusion   L2-L3: Minor disc bulging   L3-L4: Mild disc bulging   L4-L5: Disc space narrowing and bulging with small right paracentral protrusion. Mild facet spurring   L5-S1:Disc narrowing and mild bulging.  Negative facets.   IMPRESSION: Ordinary lumbar spine degeneration with widely patent canal and foramina throughout the lumbar spine.   Transitional  vertebra numbered S1.   PATIENT SURVEYS:  FOTO current score 69%, projected 68%   pt's self ratings of physical performance is higher than expected with goal score lower than current score FOTO: 09/16/2023- 52  COGNITION: Overall cognitive status: Within functional limits for tasks assessed     MUSCLE LENGTH: Hamstrings: Right 65 deg; Left 60 deg Thomas test: Right 10 deg; Left 5 deg  POSTURE: decreased lumbar lordosis  PALPATION: No tender points identified today  LUMBAR ROM: able to do a partial squat to pick up small object  from the floor  AROM eval  Flexion 70   Extension 20 painful  Right lateral flexion 15  Left lateral flexion 25  Right rotation   Left rotation    (Blank rows = not tested)  TRUNK STRENGTH:  Decreased activation of transverse abdominus muscles; abdominals 4-/5; decreased activation of lumbar multifidi; trunk extensors 4-/5  LOWER EXTREMITY ROM:   Grossly WFLS, some hip stiffness with internal and external rotation  LOWER EXTREMITY MMT:  Able to rise sit to stand without UE use Poor balance with SLS (needs UE support):  right/left pelvic drop indicating glute medius weakness left more than rigth   MMT Right eval Left eval 12/24  Hip flexion 4 4 4   Hip extension 4- 4- 4- bil  Hip abduction 3+ 3- 4- Bil  Hip adduction     Hip internal rotation     Hip external rotation 4- 3+ 4- bil  Knee flexion 4 4 4  bil  Knee extension 4 4- 4 bil  Ankle dorsiflexion     Ankle plantarflexion     Ankle inversion     Ankle eversion      (Blank rows = not tested)  LUMBAR SPECIAL TESTS:  Straight leg raise test: Negative  FUNCTIONAL TESTS:  5x STS no hands 34.47 TUG 16.03 3 mwt 598 WIDE BASE OF SUPPORT,HEAVIER LEFT STRIKE 09/02/2023 5STS:20.16sec No UE support TUG:12.20 sec : 465ft : 952 ft; RPE 5 GAIT:  Comments: wide base of support, decreased gait speed, pelvic drop  TODAY'S TREATMENT:                                                                                                                               DATE:   09/20/23:Pt arrives for aquatic physical therapy. Treatment took place in 3.5-5.5 feet of water. Water temperature was. Pt entered the pool via stairs slowly and step to step with rails. Pt requires buoyancy of water for support and to offload joints with strengthening exercises.  Seated water bench with 75% submersion Pt performed seated LE AROM exercises 20x in all planes, with concurrent review of status. 75% water walking 8x in each direction with no rest break in between directions using single buoy wts for push/pull Standing UE arm movements 10x with single buoy UE wts. High knee marching holding single buoy hand floats 4 lengths, much less wobbly in SLS. Hip 3 ways Bil 15x with UE support on wall. Standing at wall for core/lat press with single buoy wt 2x10. VC to contract gluteals and core. Horizontal decompression float with 100% flotation for pain management and to decrease CNS input.    09/16/2023 Discussion of POC and when re-certification is done FOTO:52 NuStep L2 8 min- PT present to discuss status & schedule aquatics 3 way SB stretch x 8 each direction Seated hamstring stretch 2 x 30 sec bilateral  Seated trunk extension with magenta long loop 2 x 10 Seated alt hand and knee press  x 12 each   09/13/23:Pt arrives for aquatic physical therapy. Treatment took place in 3.5-5.5 feet of water. Water temperature was. Pt entered the pool via stairs slowly and step to step with rails. Pt requires buoyancy of water for support and to offload joints with strengthening exercises.  Seated water bench with 75% submersion Pt performed seated LE AROM exercises 20x in all planes, with concurrent review of status. 75% water walking 8x in each direction with no rest break in between directions. Standing UE arm movements 10x with single buoy UE wts. High knee marching holding blue noodle 2 lengths, much less wobbly in SLS.  Hip 3 ways Bil 15x with UE support on wall.  Horizontal decompression float with 100% flotation for pain management and to decrease CNS input.   PATIENT EDUCATION:  Education details: Educated patient on anatomy and physiology of current symptoms, prognosis, plan of care as well as initial self care strategies to promote recovery;  DN info Person educated: Patient Education method: Explanation Education comprehension: verbalized understanding  HOME EXERCISE PROGRAM: Decompression series see pt instructions Access Code: [email or web address] URL: [email or web address] Date: 08/02/2023 Prepared by: Glade Pesa 12/3:  Limit to 5 reps with HEP secondary to flare ups following ex Exercises - Sit to Stand  - 1 x daily - 7 x weekly - 1 sets - 10 reps - Hooklying Clamshell with Resistance  - 1 x daily - 7 x weekly - 1 sets - 10 reps - Hooklying Isometric Hip Flexion with Opposite Arm  - 1 x daily - 7 x weekly - 1 sets - 10 reps - Seated Diagonal Chop with Medicine Ball  - 1 x daily - 7 x weekly - 4 sets - 5 reps - Standing Row with Anchored Resistance  - 1 x daily - 7 x weekly - 1 sets - 10 reps  ASSESSMENT:  CLINICAL IMPRESSION: Pt continues to have an excellent response to aquatic exercise. Pain free time getting longer after each pool session. Pt tolerated all additional reps and new exercises today very well.     OBJECTIVE IMPAIRMENTS: decreased activity tolerance, difficulty walking, decreased ROM, decreased strength, impaired perceived functional ability, and pain.   ACTIVITY LIMITATIONS: sitting, sleeping, stairs, bed mobility, and locomotion level  PARTICIPATION LIMITATIONS: interpersonal relationship, community activity, and walking longer distances, spending time with granddaughter  PERSONAL FACTORS: 3+ comorbidities: fibromyalgia, frequent falls, time since onset, tremors, HTN, multi jt OA  are also affecting patient's functional outcome.   REHAB POTENTIAL: Good  CLINICAL  DECISION MAKING: Stable/uncomplicated  EVALUATION COMPLEXITY: Low   GOALS: Goals reviewed with patient? Yes  SHORT TERM GOALS: Target date: 08/28/2023    The patient will demonstrate knowledge of basic self care strategies and exercises to promote healing  Baseline: Goal status: met 12/24  2.  25% improvement in sleep reported /decreased back pain Baseline:  Goal status: ongoing  3.  The patient will have improved hip strength to at least 4-/5 needed for standing, walking longer distances and descending stairs at home and in the community  Baseline:  Goal status: met 12/24  4.  Improved LE and trunk strength with greater ease with sit to stand 5x in <25 sec Baseline:  Goal status: MET 09/02/2023   LONG TERM GOALS: Target date: 09/25/2023    The patient will be independent in a safe self progression of a home exercise program to promote further recovery of function  Baseline:  Goal status: INITIAL  2.  50% improvement in sleep/ due  to decreased back pain Baseline:  Goal status: INITIAL  3.  The patient will have improved hip strength to at least 4/5 needed for standing, walking longer distances and descending stairs at home and in the community  Baseline:  Goal status: INITIAL  4.  The patient will increase walking program from 2x/week to 3-4x/week Baseline:  Goal status: INITIAL  5.  The patient will have improved trunk flexor and extensor muscle strength needed for lifting medium weight objects such as grocery bags, laundry and luggage  Baseline:  Goal status: INITIAL    PLAN:  PT FREQUENCY: 2x/week  PT DURATION: 8 weeks  PLANNED INTERVENTIONS: 97164- PT Re-evaluation, 97110-Therapeutic exercises, 97530- Therapeutic activity, 97112- Neuromuscular re-education, 97535- Self Care, [id number]- Manual therapy, [id number]- Aquatic Therapy, 97014- Electrical stimulation (unattended), [id number]- Electrical stimulation (manual), [id number]- Ultrasound, [id number]- Traction (mechanical),  [id number]- Ionotophoresis 4mg /ml Dexamethasone, Patient/Family education, Taping, Dry Needling, Joint mobilization, Spinal mobilization, Cryotherapy, and Moist heat.  PLAN FOR NEXT SESSION:  assess tolerance to treatment session; standing LE strengthening  Delon Darner, PTA 09/20/23 2:16 PM

## 2023-09-20 ENCOUNTER — Ambulatory Visit: Payer: Medicare Other | Attending: Internal Medicine | Admitting: Physical Therapy

## 2023-09-20 ENCOUNTER — Encounter: Payer: Self-pay | Admitting: Physical Therapy

## 2023-09-20 ENCOUNTER — Encounter: Payer: Medicare Other | Admitting: Physical Therapy

## 2023-09-20 DIAGNOSIS — R279 Unspecified lack of coordination: Secondary | ICD-10-CM | POA: Diagnosis present

## 2023-09-20 DIAGNOSIS — M6281 Muscle weakness (generalized): Secondary | ICD-10-CM | POA: Diagnosis present

## 2023-09-20 DIAGNOSIS — R293 Abnormal posture: Secondary | ICD-10-CM | POA: Insufficient documentation

## 2023-09-20 DIAGNOSIS — R262 Difficulty in walking, not elsewhere classified: Secondary | ICD-10-CM | POA: Diagnosis present

## 2023-09-20 DIAGNOSIS — G8929 Other chronic pain: Secondary | ICD-10-CM | POA: Diagnosis present

## 2023-09-20 DIAGNOSIS — M5442 Lumbago with sciatica, left side: Secondary | ICD-10-CM | POA: Diagnosis present

## 2023-09-23 ENCOUNTER — Ambulatory Visit: Payer: Medicare Other | Admitting: Physical Therapy

## 2023-09-23 ENCOUNTER — Ambulatory Visit (INDEPENDENT_AMBULATORY_CARE_PROVIDER_SITE_OTHER): Payer: Medicare Other | Admitting: Podiatry

## 2023-09-23 ENCOUNTER — Encounter: Payer: Self-pay | Admitting: Podiatry

## 2023-09-23 ENCOUNTER — Encounter: Payer: Self-pay | Admitting: Physical Therapy

## 2023-09-23 DIAGNOSIS — R293 Abnormal posture: Secondary | ICD-10-CM

## 2023-09-23 DIAGNOSIS — M79675 Pain in left toe(s): Secondary | ICD-10-CM | POA: Diagnosis not present

## 2023-09-23 DIAGNOSIS — B351 Tinea unguium: Secondary | ICD-10-CM | POA: Diagnosis not present

## 2023-09-23 DIAGNOSIS — R262 Difficulty in walking, not elsewhere classified: Secondary | ICD-10-CM

## 2023-09-23 DIAGNOSIS — M79674 Pain in right toe(s): Secondary | ICD-10-CM

## 2023-09-23 DIAGNOSIS — M5442 Lumbago with sciatica, left side: Secondary | ICD-10-CM | POA: Diagnosis not present

## 2023-09-23 DIAGNOSIS — R279 Unspecified lack of coordination: Secondary | ICD-10-CM

## 2023-09-23 DIAGNOSIS — G8929 Other chronic pain: Secondary | ICD-10-CM

## 2023-09-23 DIAGNOSIS — M6281 Muscle weakness (generalized): Secondary | ICD-10-CM

## 2023-09-23 NOTE — Progress Notes (Signed)
 Subjective: Chief Complaint  Patient presents with   RFC    RM#14 RFC Left big toe has a split in the nail.    78 year old female with the above concerns.  She is nails are thickened elongated she not able to trim them herself.  She states that her last appointment she had to get her son to help her trim them.  No open lesions. No other concerns today.  Objective: AAO x3, NAD DP/PT pulses palpable bilaterally, CRT less than 3 seconds Nails are hypertrophic, dystrophic, brittle, discolored, elongated 10.  Incurvation of the nails without any signs of infection.  Most notably the right hallux toenail is the most thickened.  Tenderness nails 1-5 bilaterally.  No open lesions or pre-ulcerative lesions are identified today.   No pain with calf compression, swelling, warmth, erythema  Assessment: Ingrown toenail, symptomatic onychomycosis  Plan: -All treatment options discussed with the patient including all alternatives, risks, complications.  -Sharply debrided nails x10 without any complications or bleeding.  Monitor ingrown toenails for any signs or symptoms of infection. She has been doing good.  -Patient encouraged to call the office with any questions, concerns, change in symptoms.   Return in about 3 months (around 12/22/2023).  Donnice JONELLE Fees DPM

## 2023-09-23 NOTE — Therapy (Signed)
 OUTPATIENT PHYSICAL THERAPY THORACOLUMBAR TREATMENT   Patient Name: Mariah Snyder MRN: [id number] DOB:Jul 30, 1946, 78 y.o., female Today's Date: 09/23/2023    END OF SESSION:  PT End of Session - 09/23/23 1313     Visit Number 14    Date for PT Re-Evaluation 09/25/23    Authorization Type Medicare    Progress Note Due on Visit 20    PT Start Time 1222    PT Stop Time 1311    PT Time Calculation (min) 49 min    Activity Tolerance Patient tolerated treatment well    Behavior During Therapy WFL for tasks assessed/performed                     Past Medical History:  Diagnosis Date   Anemia    Anxiety    Arthritis    Asthma    Fibromyalgia    Gallstones    GERD (gastroesophageal reflux disease)    Hyperlipidemia    Hypertension    Hypothyroidism    Kidney stones    Peptic ulcer    Rectal prolapse    Sleep apnea    Urinary tract infection    Past Surgical History:  Procedure Laterality Date   bunyionectomy Left    CHOLECYSTECTOMY     ELBOW ARTHROSCOPY Left    LAPAROSCOPIC ASSISTED VAGINAL HYSTERECTOMY     SHOULDER ARTHROSCOPY Right    TONSILLECTOMY     Patient Active Problem List   Diagnosis Date Noted   Pain due to onychomycosis of toenails of both feet 07/15/2023   Constipation 09/14/2022   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/2021   Left hip pain 10/12/2021   High cholesterol 03/07/2021   Moderate persistent asthma without complication 11/01/2020   Bereavement 02/06/2019   Bilateral temporomandibular joint pain 02/18/2018   Right leg numbness 05/21/2017   Squamous blepharitis of upper and lower eyelids of both eyes 05/21/2017   Meibomian gland dysfunction (MGD) of upper and lower lids of both eyes 02/20/2017   Chronic left-sided low back pain with left-sided sciatica 12/31/2016   OAB (overactive bladder) 08/29/2015   Hallux limitus 01/24/2015   Balance problems 05/12/2013   Lichen sclerosus 05/12/2013   Healthcare maintenance 01/12/2013    Essential tremor 07/08/2012   Gastro-esophageal reflux disease with esophagitis 07/02/2011   Fibromyalgia 05/27/2011   Keratoconjunctivitis sicca 05/10/2011   Senile nuclear sclerosis 05/10/2011   Tear film insufficiency 05/10/2011   Essential (primary) hypertension 02/26/2011   Degenerative spinal arthritis 08/28/2000   Hashimoto's thyroiditis 08/28/1980    PCP: Rollene Norris MD  REFERRING PROVIDER: Rollene Norris MD  REFERRING DIAG: [phone], G89.29 chronic left sided low back pain with left sided sciatica  Rationale for Evaluation and Treatment: Rehabilitation  THERAPY DIAG:  Back pain; weakness ONSET DATE: 6 months  SUBJECTIVE:  SUBJECTIVE STATEMENT:Patient reports she is doing good today. Aquatics is very helpful.   PERTINENT HISTORY:  Pelvic floor PT with Ermin[facility] had DN Fibromyalgia (hard to get out of bed in the morning); history of HTN, arthritis, asthma, chronic constipation, hypothyroidism; sleep apnea, essential tremor being treated with Botox  (1st round didn't work)  overactive bladder, hysterectomy, and cholecystectomy   Volunteers at the hospital  Able to walk 2 miles 2x/week  Pt states she has been lazy about ex in the past  PAIN: 09/23/2023  Are you having pain? 5/10  NPRS scale:   Pain location: cervical today Pain orientation: Left and Lower  PAIN TYPE: sharp Pain description: intermittent and sharp  Aggravating factors: sitting long periods, lying on sofa on back; sleeping on left side, turning over in bed Relieving factors: lying prone pressing up with hands (learned in PT years ago and still does them 1x/week 20 reps; sitting shorter periods; walk around short distances  PRECAUTIONS: fall risk    WEIGHT BEARING RESTRICTIONS: No  FALLS:  Yes 3-4x in the  last 6 months; fell 3 nights ago when she got up to go to the bathroom, toe slipped and hit back on the toilet, light bruise mid thoracic region left   LIVING ENVIRONMENT: Stairs: bedroom downstairs (avoids takes longer than it used to  OCCUPATION: retired from [employer], 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   PLOF: Independent  PATIENT GOALS: I hope the pain is gone;  I would like to walk more, ride a bicycle, sleep more  NEXT MD VISIT: call as needed  OBJECTIVE:  Note: Objective measures were completed at Evaluation unless otherwise noted.  DIAGNOSTIC FINDINGS:   11/02/2021  RI LUMBAR SPINE WITHOUT CONTRAST   TECHNIQUE: Multiplanar, multisequence MR imaging of the lumbar spine was performed. No intravenous contrast was administered.   COMPARISON:  X-ray lumbar 10/11/2021.   FINDINGS: Segmentation: Transitional lumbosacral vertebra numbered S1. This places hypoplastic ribs or unfused transverse processes at L1 by prior radiography.   Alignment:  Negative for listhesis or significant scoliosis   Vertebrae: No fracture, evidence of discitis, or aggressive bone lesion.   Conus medullaris and cauda equina: Conus extends to the L2 level. Conus and cauda equina appear normal.   Paraspinal and other soft tissues: Negative. 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   Disc levels:   T12- L1: Minor disc bulging   L1-L2: Mild disc bulging with small right paracentral protrusion   L2-L3: Minor disc bulging   L3-L4: Mild disc bulging   L4-L5: Disc space narrowing and bulging with small right paracentral protrusion. Mild facet spurring   L5-S1:Disc narrowing and mild bulging.  Negative facets.   IMPRESSION: Ordinary lumbar spine degeneration with widely patent canal and foramina throughout the lumbar spine.   Transitional  vertebra numbered S1.   PATIENT SURVEYS:  FOTO current score 69%, projected 68%   pt's self ratings of physical performance is higher than expected with goal score lower than current score FOTO: 09/16/2023- 52  COGNITION: Overall cognitive status: Within functional limits for tasks assessed     MUSCLE LENGTH: Hamstrings: Right 65 deg; Left 60 deg Thomas test: Right 10 deg; Left 5 deg  POSTURE: decreased lumbar lordosis  PALPATION: No tender points identified today  LUMBAR ROM: able to do a partial squat to pick up small object  from the floor  AROM eval  Flexion 70   Extension 20 painful  Right lateral flexion 15  Left lateral flexion 25  Right rotation   Left rotation    (Blank rows = not tested)  TRUNK STRENGTH:  Decreased activation of transverse abdominus muscles; abdominals 4-/5; decreased activation of lumbar multifidi; trunk extensors 4-/5  LOWER EXTREMITY ROM:   Grossly WFLS, some hip stiffness with internal and external rotation  LOWER EXTREMITY MMT:  Able to rise sit to stand without UE use Poor balance with SLS (needs UE support):  right/left pelvic drop indicating glute medius weakness left more than rigth   MMT Right eval Left eval 12/24  Hip flexion 4 4 4   Hip extension 4- 4- 4- bil  Hip abduction 3+ 3- 4- Bil  Hip adduction     Hip internal rotation     Hip external rotation 4- 3+ 4- bil  Knee flexion 4 4 4  bil  Knee extension 4 4- 4 bil  Ankle dorsiflexion     Ankle plantarflexion     Ankle inversion     Ankle eversion      (Blank rows = not tested)  LUMBAR SPECIAL TESTS:  Straight leg raise test: Negative  FUNCTIONAL TESTS:  5x STS no hands 34.47 TUG 16.03 3 mwt 598 WIDE BASE OF SUPPORT,HEAVIER LEFT STRIKE 09/02/2023 5STS:20.16sec No UE support TUG:12.20 sec : 475ft : 952 ft; RPE 5 09/23/2023 TUG:15.11 sec 5STS:26.63 no UE support GAIT:  Comments: wide base of support, decreased gait speed, pelvic drop  TODAY'S TREATMENT:                                                                                                                               DATE:  09/23/2023 NuStep L5 6 min- PT present to discuss status  TUG:15.11 sec 5STS:26.63 no UE support 6 inch step taps x 10 each Seated trunk extension at barre with blue loop 2 x 10 Unilateral 5# KB hold + hip flexion; unilateral support on barre x 20 each direction Hip abduction on airex 2 x 10 Airex step ups x 10 bilateral  3 way SB stretch x 8 each direction Seated hip abduction with red loop 2 x 10  Seated hamstring stretch 2 x 30 sec bilateral   09/20/23:Pt arrives for aquatic physical therapy. Treatment took place in 3.5-5.5 feet of water. Water temperature was. Pt entered the pool via stairs slowly and step to step with rails. Pt requires buoyancy of water for support and to offload joints with strengthening exercises.  Seated water bench with 75% submersion Pt performed seated LE AROM exercises 20x in all planes, with concurrent review of status. 75% water walking 8x in each direction with no rest break in between directions using single buoy wts for push/pull Standing UE arm movements 10x with single buoy UE wts. High knee marching holding single buoy hand floats 4 lengths, much less wobbly in SLS. Hip  3 ways Bil 15x with UE support on wall. Standing at wall for core/lat press with single buoy wt 2x10. VC to contract gluteals and core. Horizontal decompression float with 100% flotation for pain management and to decrease CNS input.    09/16/2023 Discussion of POC and when re-certification is done FOTO:52 NuStep L2 8 min- PT present to discuss status & schedule aquatics 3 way SB stretch x 8 each direction Seated hamstring stretch 2 x 30 sec bilateral  Seated trunk extension with magenta long loop 2 x 10 Seated alt hand and knee press x 12 each   09/13/23:Pt arrives for aquatic physical therapy. Treatment took place in 3.5-5.5 feet of water. Water temperature  was. Pt entered the pool via stairs slowly and step to step with rails. Pt requires buoyancy of water for support and to offload joints with strengthening exercises.  Seated water bench with 75% submersion Pt performed seated LE AROM exercises 20x in all planes, with concurrent review of status. 75% water walking 8x in each direction with no rest break in between directions. Standing UE arm movements 10x with single buoy UE wts. High knee marching holding blue noodle 2 lengths, much less wobbly in SLS. Hip 3 ways Bil 15x with UE support on wall.  Horizontal decompression float with 100% flotation for pain management and to decrease CNS input.   PATIENT EDUCATION:  Education details: Educated patient on anatomy and physiology of current symptoms, prognosis, plan of care as well as initial self care strategies to promote recovery;  DN info Person educated: Patient Education method: Explanation Education comprehension: verbalized understanding  HOME EXERCISE PROGRAM: Decompression series see pt instructions Access Code: [email or web address] URL: [email or web address] Date: 08/02/2023 Prepared by: Glade Pesa 12/3:  Limit to 5 reps with HEP secondary to flare ups following ex Exercises - Sit to Stand  - 1 x daily - 7 x weekly - 1 sets - 10 reps - Hooklying Clamshell with Resistance  - 1 x daily - 7 x weekly - 1 sets - 10 reps - Hooklying Isometric Hip Flexion with Opposite Arm  - 1 x daily - 7 x weekly - 1 sets - 10 reps - Seated Diagonal Chop with Medicine Ball  - 1 x daily - 7 x weekly - 4 sets - 5 reps - Standing Row with Anchored Resistance  - 1 x daily - 7 x weekly - 1 sets - 10 reps  ASSESSMENT:  CLINICAL IMPRESSION: Today's treatment session focused on LE strengthening and balance. Incorporated more standing exercises today and patient did not verbalize any increased back pain. Educated patient on the benefits on dry needling and how long relief normally lasts. Re-administered TUG  and 5STS and patient's times were a little slower compared to previous times. Patient will benefit from skilled PT to address the below impairments and improve overall function.     OBJECTIVE IMPAIRMENTS: decreased activity tolerance, difficulty walking, decreased ROM, decreased strength, impaired perceived functional ability, and pain.   ACTIVITY LIMITATIONS: sitting, sleeping, stairs, bed mobility, and locomotion level  PARTICIPATION LIMITATIONS: interpersonal relationship, community activity, and walking longer distances, spending time with granddaughter  PERSONAL FACTORS: 3+ comorbidities: fibromyalgia, frequent falls, time since onset, tremors, HTN, multi jt OA  are also affecting patient's functional outcome.   REHAB POTENTIAL: Good  CLINICAL DECISION MAKING: Stable/uncomplicated  EVALUATION COMPLEXITY: Low   GOALS: Goals reviewed with patient? Yes  SHORT TERM GOALS: Target date: 08/28/2023    The patient will demonstrate knowledge of basic self care  strategies and exercises to promote healing  Baseline: Goal status: met 12/24  2.  25% improvement in sleep reported /decreased back pain Baseline:  Goal status: ongoing  3.  The patient will have improved hip strength to at least 4-/5 needed for standing, walking longer distances and descending stairs at home and in the community  Baseline:  Goal status: met 12/24  4.  Improved LE and trunk strength with greater ease with sit to stand 5x in <25 sec Baseline:  Goal status: MET 09/02/2023   LONG TERM GOALS: Target date: 09/25/2023    The patient will be independent in a safe self progression of a home exercise program to promote further recovery of function  Baseline:  Goal status: INITIAL  2.  50% improvement in sleep/ due to decreased back pain Baseline:  Goal status: INITIAL  3.  The patient will have improved hip strength to at least 4/5 needed for standing, walking longer distances and descending stairs at  home and in the community  Baseline:  Goal status: INITIAL  4.  The patient will increase walking program from 2x/week to 3-4x/week Baseline:  Goal status: INITIAL  5.  The patient will have improved trunk flexor and extensor muscle strength needed for lifting medium weight objects such as grocery bags, laundry and luggage  Baseline:  Goal status: INITIAL    PLAN:  PT FREQUENCY: 2x/week  PT DURATION: 8 weeks  PLANNED INTERVENTIONS: 97164- PT Re-evaluation, 97110-Therapeutic exercises, 97530- Therapeutic activity, 97112- Neuromuscular re-education, 97535- Self Care, [id number]- Manual therapy, [id number]- Aquatic Therapy, 97014- Electrical stimulation (unattended), [id number]- Electrical stimulation (manual), [id number]- Ultrasound, [id number]- Traction (mechanical), [id number]- Ionotophoresis 4mg /ml Dexamethasone, Patient/Family education, Taping, Dry Needling, Joint mobilization, Spinal mobilization, Cryotherapy, and Moist heat.  PLAN FOR NEXT SESSION:  re-certification next visit (5 weeks); patient wants to be dry needled next session; continue aquatics next week; reassess (re-assessed  TUG & 5 STS this session)  Kristeen Sar, PT 09/23/23 1:14 PM

## 2023-09-25 ENCOUNTER — Ambulatory Visit: Payer: Medicare Other | Admitting: Physical Therapy

## 2023-09-25 DIAGNOSIS — R293 Abnormal posture: Secondary | ICD-10-CM

## 2023-09-25 DIAGNOSIS — G8929 Other chronic pain: Secondary | ICD-10-CM

## 2023-09-25 DIAGNOSIS — M5442 Lumbago with sciatica, left side: Secondary | ICD-10-CM | POA: Diagnosis not present

## 2023-09-25 DIAGNOSIS — M6281 Muscle weakness (generalized): Secondary | ICD-10-CM

## 2023-09-25 NOTE — Therapy (Signed)
 OUTPATIENT PHYSICAL THERAPY THORACOLUMBAR TREATMENT/RECERTIFICATION   Patient Name: Mariah Snyder MRN: [id number] DOB:December 11, 1945, 78 y.o., female Today's Date: 09/25/2023    END OF SESSION:  PT End of Session - 09/25/23 1236     Visit Number 15    Date for PT Re-Evaluation 11/20/23    Authorization Type Medicare    Progress Note Due on Visit 20    PT Start Time 1235    PT Stop Time 1315    PT Time Calculation (min) 40 min    Activity Tolerance Patient tolerated treatment well                     Past Medical History:  Diagnosis Date   Anemia    Anxiety    Arthritis    Asthma    Fibromyalgia    Gallstones    GERD (gastroesophageal reflux disease)    Hyperlipidemia    Hypertension    Hypothyroidism    Kidney stones    Peptic ulcer    Rectal prolapse    Sleep apnea    Urinary tract infection    Past Surgical History:  Procedure Laterality Date   bunyionectomy Left    CHOLECYSTECTOMY     ELBOW ARTHROSCOPY Left    LAPAROSCOPIC ASSISTED VAGINAL HYSTERECTOMY     SHOULDER ARTHROSCOPY Right    TONSILLECTOMY     Patient Active Problem List   Diagnosis Date Noted   Pain due to onychomycosis of toenails of both feet 07/15/2023   Constipation 09/14/2022   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/2021   Left hip pain 10/12/2021   High cholesterol 03/07/2021   Moderate persistent asthma without complication 11/01/2020   Bereavement 02/06/2019   Bilateral temporomandibular joint pain 02/18/2018   Right leg numbness 05/21/2017   Squamous blepharitis of upper and lower eyelids of both eyes 05/21/2017   Meibomian gland dysfunction (MGD) of upper and lower lids of both eyes 02/20/2017   Chronic left-sided low back pain with left-sided sciatica 12/31/2016   OAB (overactive bladder) 08/29/2015   Hallux limitus 01/24/2015   Balance problems 05/12/2013   Lichen sclerosus 05/12/2013   Healthcare maintenance 01/12/2013   Essential tremor 07/08/2012   Gastro-esophageal  reflux disease with esophagitis 07/02/2011   Fibromyalgia 05/27/2011   Keratoconjunctivitis sicca 05/10/2011   Senile nuclear sclerosis 05/10/2011   Tear film insufficiency 05/10/2011   Essential (primary) hypertension 02/26/2011   Degenerative spinal arthritis 08/28/2000   Hashimoto's thyroiditis 08/28/1980    PCP: Rollene Norris MD  REFERRING PROVIDER: Rollene Norris MD  REFERRING DIAG: [phone], G89.29 chronic left sided low back pain with left sided sciatica  Rationale for Evaluation and Treatment: Rehabilitation  THERAPY DIAG:  Back pain; weakness ONSET DATE: 6 months  SUBJECTIVE:  SUBJECTIVE STATEMENT: I tipped over this morning and getting up off the floor.  I think the aquatic PT really helps.  I have some days of relief from the DN and didn't wake up in the middle of the night.   I get relief in the pool when she has me floating with foam under me.  I can go home for the rest of the day without pain.  I walk the dog 4x day.   Doing the exercises at home always makes my pain worse.  PERTINENT HISTORY:  Pelvic floor PT with Erminio had DN Fibromyalgia (hard to get out of bed in the morning); history of HTN, arthritis, asthma, chronic constipation, hypothyroidism; sleep apnea, essential tremor being treated with Botox  (1st round didn't work)  overactive bladder, hysterectomy, and cholecystectomy   Volunteers at the hospital  Able to walk 2 miles 2x/week  Pt states she has been lazy about ex in the past  PAIN: 09/23/2023  Are you having pain? It hurts all over, my neck is killing me b/c I tipped over   5/10  NPRS scale:   Pain location: cervical and back Pain orientation: Left and Lower  PAIN TYPE: sharp Pain description: intermittent and sharp  Aggravating factors: sitting long  periods, lying on sofa on back; sleeping on left side, turning over in bed Relieving factors: lying prone pressing up with hands (learned in PT years ago and still does them 1x/week 20 reps; sitting shorter periods; walk around short distances  PRECAUTIONS: fall risk    WEIGHT BEARING RESTRICTIONS: No  FALLS:  Yes 3-4x in the last 6 months; fell 3 nights ago when she got up to go to the bathroom, toe slipped and hit back on the toilet, light bruise mid thoracic region left   LIVING ENVIRONMENT: Stairs: bedroom downstairs (avoids takes longer than it used to  OCCUPATION: retired from [employer], 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   PLOF: Independent  PATIENT GOALS: I hope the pain is gone;  I would like to walk more, ride a bicycle, sleep more  NEXT MD VISIT: call as needed  OBJECTIVE:  Note: Objective measures were completed at Evaluation unless otherwise noted.  DIAGNOSTIC FINDINGS:   11/02/2021  RI LUMBAR SPINE WITHOUT CONTRAST   TECHNIQUE: Multiplanar, multisequence MR imaging of the lumbar spine was performed. No intravenous contrast was administered.   COMPARISON:  X-ray lumbar 10/11/2021.   FINDINGS: Segmentation: Transitional lumbosacral vertebra numbered S1. This places hypoplastic ribs or unfused transverse processes at L1 by prior radiography.   Alignment:  Negative for listhesis or significant scoliosis   Vertebrae: No fracture, evidence of discitis, or aggressive bone lesion.   Conus medullaris and cauda equina: Conus extends to the L2 level. Conus and cauda equina appear normal.   Paraspinal and other soft tissues: Negative. 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   Disc levels:   T12- L1: Minor disc bulging   L1-L2: Mild disc bulging with small right paracentral protrusion   L2-L3: Minor disc  bulging   L3-L4: Mild disc bulging   L4-L5: Disc space narrowing and bulging with small right paracentral protrusion. Mild facet spurring   L5-S1:Disc narrowing and mild bulging.  Negative facets.   IMPRESSION: Ordinary lumbar spine degeneration with widely patent canal and foramina throughout the lumbar spine.   Transitional vertebra  numbered S1.   PATIENT SURVEYS:  FOTO current score 69%, projected 68%   pt's self ratings of physical performance is higher than expected with goal score lower than current score FOTO: 09/16/2023- 52  COGNITION: Overall cognitive status: Within functional limits for tasks assessed     MUSCLE LENGTH: Hamstrings: Right 65 deg; Left 60 deg Thomas test: Right 10 deg; Left 5 deg  POSTURE: decreased lumbar lordosis  PALPATION: No tender points identified today  LUMBAR ROM: able to do a partial squat to pick up small object from the floor  AROM eval  Flexion 70   Extension 20 painful  Right lateral flexion 15  Left lateral flexion 25  Right rotation   Left rotation    (Blank rows = not tested)  TRUNK STRENGTH:  Decreased activation of transverse abdominus muscles; abdominals 4-/5; decreased activation of lumbar multifidi; trunk extensors 4-/5  LOWER EXTREMITY ROM:   Grossly WFLS, some hip stiffness with internal and external rotation  LOWER EXTREMITY MMT:  Able to rise sit to stand without UE use Poor balance with SLS (needs UE support):  right/left pelvic drop indicating glute medius weakness left more than rigth   MMT Right eval Left eval 12/24  Hip flexion 4 4 4   Hip extension 4- 4- 4- bil  Hip abduction 3+ 3- 4- Bil  Hip adduction     Hip internal rotation     Hip external rotation 4- 3+ 4- bil  Knee flexion 4 4 4  bil  Knee extension 4 4- 4 bil  Ankle dorsiflexion     Ankle plantarflexion     Ankle inversion     Ankle eversion      (Blank rows = not tested)  LUMBAR SPECIAL TESTS:  Straight leg raise test:  Negative  FUNCTIONAL TESTS:  5x STS no hands 34.47 TUG 16.03 3 mwt 598 WIDE BASE OF SUPPORT,HEAVIER LEFT STRIKE 09/02/2023 5STS:20.16sec No UE support TUG:12.20 sec : 449ft : 952 ft; RPE 5 09/23/2023 TUG:15.11 sec 5STS:26.63 no UE support  09/25/23:  916 ffet 5/10 GAIT:  Comments: wide base of support, decreased gait speed, pelvic drop  TODAY'S TREATMENT:                                                                                                                              DATE:  09/25/2023 Discussion of response to land based exercise= always worse despite adjustments to number of reps, lying down (unloaded vs loaded) and type of exercise Response to aquatic= pain relief for hours and even days after Response to DN= sleeps better, pain relief even for a few days 6 MWT Review of progress toward goals Trigger Point Dry Needling  Subsequent Treatment: Instructions provided previously at initial dry needling treatment.   Patient Verbal Consent Given: Yes Education Handout Provided: Previously Provided Muscles Treated: bil lumbar multifidi marination Electrical Stimulation Performed: No Treatment Response/Outcome: decreased pain; improved lumbar fascial mobility   09/23/2023 NuStep L5  6 min- PT present to discuss status  TUG:15.11 sec 5STS:26.63 no UE support 6 inch step taps x 10 each Seated trunk extension at barre with blue loop 2 x 10 Unilateral 5# KB hold + hip flexion; unilateral support on barre x 20 each direction Hip abduction on airex 2 x 10 Airex step ups x 10 bilateral  3 way SB stretch x 8 each direction Seated hip abduction with red loop 2 x 10  Seated hamstring stretch 2 x 30 sec bilateral   09/20/23:Pt arrives for aquatic physical therapy. Treatment took place in 3.5-5.5 feet of water. Water temperature was. Pt entered the pool via stairs slowly and step to step with rails. Pt requires buoyancy of water for support and to offload joints with  strengthening exercises.  Seated water bench with 75% submersion Pt performed seated LE AROM exercises 20x in all planes, with concurrent review of status. 75% water walking 8x in each direction with no rest break in between directions using single buoy wts for push/pull Standing UE arm movements 10x with single buoy UE wts. High knee marching holding single buoy hand floats 4 lengths, much less wobbly in SLS. Hip 3 ways Bil 15x with UE support on wall. Standing at wall for core/lat press with single buoy wt 2x10. VC to contract gluteals and core. Horizontal decompression float with 100% flotation for pain management and to decrease CNS input.    09/16/2023 Discussion of POC and when re-certification is done FOTO:52 NuStep L2 8 min- PT present to discuss status & schedule aquatics 3 way SB stretch x 8 each direction Seated hamstring stretch 2 x 30 sec bilateral  Seated trunk extension with magenta long loop 2 x 10 Seated alt hand and knee press x 12 each   09/13/23:Pt arrives for aquatic physical therapy. Treatment took place in 3.5-5.5 feet of water. Water temperature was. Pt entered the pool via stairs slowly and step to step with rails. Pt requires buoyancy of water for support and to offload joints with strengthening exercises.  Seated water bench with 75% submersion Pt performed seated LE AROM exercises 20x in all planes, with concurrent review of status. 75% water walking 8x in each direction with no rest break in between directions. Standing UE arm movements 10x with single buoy UE wts. High knee marching holding blue noodle 2 lengths, much less wobbly in SLS. Hip 3 ways Bil 15x with UE support on wall.  Horizontal decompression float with 100% flotation for pain management and to decrease CNS input.   PATIENT EDUCATION:  Education details: Educated patient on anatomy and physiology of current symptoms, prognosis, plan of care as well as initial self care strategies to promote recovery;   DN info Person educated: Patient Education method: Explanation Education comprehension: verbalized understanding  HOME EXERCISE PROGRAM: Decompression series see pt instructions Access Code: [email or web address] URL: [email or web address] Date: 08/02/2023 Prepared by: Glade Pesa 12/3:  Limit to 5 reps with HEP secondary to flare ups following ex Exercises - Sit to Stand  - 1 x daily - 7 x weekly - 1 sets - 10 reps - Hooklying Clamshell with Resistance  - 1 x daily - 7 x weekly - 1 sets - 10 reps - Hooklying Isometric Hip Flexion with Opposite Arm  - 1 x daily - 7 x weekly - 1 sets - 10 reps - Seated Diagonal Chop with Medicine Ball  - 1 x daily - 7 x weekly - 4 sets - 5 reps - Standing Row with  Anchored Resistance  - 1 x daily - 7 x weekly - 1 sets - 10 reps  ASSESSMENT:  CLINICAL IMPRESSION:  Mariah Snyder has been intolerant to land based exercise which consistently worsens her pain despite modifications to intensity, reduced number of reps and positioning. She does respond well to aquatic exercise which provides days of pain relief and allows her to improve mobility and strength as well.  She also responds well to dry needling for temporary pain relief.  Recommend continued aquatic PT with decreasing frequency over the next 2 months to promote independence and readiness to transition to an independent pool program upon completion of certification period.      OBJECTIVE IMPAIRMENTS: decreased activity tolerance, difficulty walking, decreased ROM, decreased strength, impaired perceived functional ability, and pain.   ACTIVITY LIMITATIONS: sitting, sleeping, stairs, bed mobility, and locomotion level  PARTICIPATION LIMITATIONS: interpersonal relationship, community activity, and walking longer distances, spending time with granddaughter  PERSONAL FACTORS: 3+ comorbidities: fibromyalgia, frequent falls, time since onset, tremors, HTN, multi jt OA  are also affecting patient's  functional outcome.   REHAB POTENTIAL: Good  CLINICAL DECISION MAKING: Stable/uncomplicated  EVALUATION COMPLEXITY: Low   GOALS: Goals reviewed with patient? Yes  SHORT TERM GOALS: Target date: 08/28/2023    The patient will demonstrate knowledge of basic self care strategies and exercises to promote healing  Baseline: Goal status: met 12/24  2.  25% improvement in sleep reported /decreased back pain Baseline:  Goal status: ongoing  3.  The patient will have improved hip strength to at least 4-/5 needed for standing, walking longer distances and descending stairs at home and in the community  Baseline:  Goal status: met 12/24  4.  Improved LE and trunk strength with greater ease with sit to stand 5x in <25 sec Baseline:  Goal status: MET 09/02/2023   LONG TERM GOALS: Target date: 11/20/2023    The patient will be independent in an aquatic ex program Baseline:  Goal status: revised  2.  50% improvement in sleep/ due to decreased back pain Baseline:  Goal status: ongoing  3.  The patient will have improved hip strength to at least 4/5 needed for standing, walking longer distances and descending stairs at home and in the community  Baseline:  Goal status: ongoing 4.  The patient will increase walking program from 2x/week to 3-4x/week Baseline:  Goal status: met 1/8  5.  The patient will have improved trunk flexor and extensor muscle strength needed for lifting medium weight objects such as grocery bags, laundry and luggage  Baseline:  Goal status: it hurts but I do it met 1/8  6. The patient will have improved gait stamina and speed needed to ambulate > 1000 feet in 6 minutes  new        PLAN:  PT FREQUENCY: 1-2x/week  PT DURATION: 8 weeks  PLANNED INTERVENTIONS: 97164- PT Re-evaluation, 97110-Therapeutic exercises, 97530- Therapeutic activity, 97112- Neuromuscular re-education, 97535- Self Care, [id number]- Manual therapy, [id number]- Aquatic Therapy, 97014-  Electrical stimulation (unattended), [id number]- Electrical stimulation (manual), [id number]- Ultrasound, [id number]- Traction (mechanical), [id number]- Ionotophoresis 4mg /ml Dexamethasone, Patient/Family education, Taping, Dry Needling, Joint mobilization, Spinal mobilization, Cryotherapy, and Moist heat.  PLAN FOR NEXT SESSION:   continue aquatics PT; DN lumbar as needed  Glade Pesa, PT 09/25/23 5:14 PM Phone: [phone] Fax: [phone]

## 2023-10-01 NOTE — Therapy (Signed)
 OUTPATIENT PHYSICAL THERAPY THORACOLUMBAR TREATMENT NOTE   Patient Name: Mariah Snyder MRN: [id number] DOB:Apr 07, 1946, 78 y.o., female Today's Date: 10/02/2023    END OF SESSION:  PT End of Session - 10/02/23 0905     Visit Number 16    Date for PT Re-Evaluation 11/20/23    Authorization Type Medicare    Progress Note Due on Visit 20    PT Start Time 0904    PT Stop Time 1004    PT Time Calculation (min) 60 min    Activity Tolerance Patient tolerated treatment well    Behavior During Therapy WFL for tasks assessed/performed                      Past Medical History:  Diagnosis Date   Anemia    Anxiety    Arthritis    Asthma    Fibromyalgia    Gallstones    GERD (gastroesophageal reflux disease)    Hyperlipidemia    Hypertension    Hypothyroidism    Kidney stones    Peptic ulcer    Rectal prolapse    Sleep apnea    Urinary tract infection    Past Surgical History:  Procedure Laterality Date   bunyionectomy Left    CHOLECYSTECTOMY     ELBOW ARTHROSCOPY Left    LAPAROSCOPIC ASSISTED VAGINAL HYSTERECTOMY     SHOULDER ARTHROSCOPY Right    TONSILLECTOMY     Patient Active Problem List   Diagnosis Date Noted   Pain due to onychomycosis of toenails of both feet 07/15/2023   Constipation 09/14/2022   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/2021   Left hip pain 10/12/2021   High cholesterol 03/07/2021   Moderate persistent asthma without complication 11/01/2020   Bereavement 02/06/2019   Bilateral temporomandibular joint pain 02/18/2018   Right leg numbness 05/21/2017   Squamous blepharitis of upper and lower eyelids of both eyes 05/21/2017   Meibomian gland dysfunction (MGD) of upper and lower lids of both eyes 02/20/2017   Chronic left-sided low back pain with left-sided sciatica 12/31/2016   OAB (overactive bladder) 08/29/2015   Hallux limitus 01/24/2015   Balance problems 05/12/2013   Lichen sclerosus 05/12/2013   Healthcare maintenance 01/12/2013    Essential tremor 07/08/2012   Gastro-esophageal reflux disease with esophagitis 07/02/2011   Fibromyalgia 05/27/2011   Keratoconjunctivitis sicca 05/10/2011   Senile nuclear sclerosis 05/10/2011   Tear film insufficiency 05/10/2011   Essential (primary) hypertension 02/26/2011   Degenerative spinal arthritis 08/28/2000   Hashimoto's thyroiditis 08/28/1980    PCP: Bambi Lever MD  REFERRING PROVIDER: Bambi Lever MD  REFERRING DIAG: [phone], G89.29 chronic left sided low back pain with left sided sciatica  Rationale for Evaluation and Treatment: Rehabilitation  THERAPY DIAG:  Back pain; weakness ONSET DATE: 6 months  SUBJECTIVE:  SUBJECTIVE STATEMENT: I shoveld my driveway and that went well. Then I tried to push my garbage out to the street, the wheels slid onsome ice and I went down with the garbage. My back muscles are very sore from this,   PERTINENT HISTORY:  Pelvic floor PT with Velvet Gibbs had DN Fibromyalgia (hard to get out of bed in the morning); history of HTN, arthritis, asthma, chronic constipation, hypothyroidism; sleep apnea, essential tremor being treated with Botox  (1st round didn't work)  overactive bladder, hysterectomy, and cholecystectomy   Volunteers at the hospital  Able to walk 2 miles 2x/week  Pt states she has been "lazy" about ex in the past  PAIN: 09/23/2023  Are you having pain? Back muscles very sore from fall on ice.  6-7/10  NPRS scale:   Pain location: back Pain orientation: Left and Lower  PAIN TYPE:sore Pain description: intermittent and sharp  Aggravating factors: sitting long periods, lying on sofa on back; sleeping on left side, turning over in bed Relieving factors: lying prone pressing up with hands (learned in PT years ago and still does them  1x/week 20 reps; sitting shorter periods; walk around short distances  PRECAUTIONS: fall risk    WEIGHT BEARING RESTRICTIONS: No  FALLS:  Yes 3-4x in the last 6 months; fell 3 nights ago when she got up to go to the bathroom, toe slipped and hit back on the toilet, light bruise mid thoracic region left   LIVING ENVIRONMENT: Stairs: bedroom downstairs (avoids takes longer than it used to  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   PLOF: Independent  PATIENT GOALS: I hope the pain is gone;  I would like to walk more, ride a bicycle, sleep more  NEXT MD VISIT: call as needed  OBJECTIVE:  Note: Objective measures were completed at Evaluation unless otherwise noted.  DIAGNOSTIC FINDINGS:   11/02/2021  RI LUMBAR SPINE WITHOUT CONTRAST   TECHNIQUE: Multiplanar, multisequence MR imaging of the lumbar spine was performed. No intravenous contrast was administered.   COMPARISON:  X-ray lumbar 10/11/2021.   FINDINGS: Segmentation: Transitional lumbosacral vertebra numbered S1. This places hypoplastic ribs or unfused transverse processes at L1 by prior radiography.   Alignment:  Negative for listhesis or significant scoliosis   Vertebrae: No fracture, evidence of discitis, or aggressive bone lesion.   Conus medullaris and cauda equina: Conus extends to the L2 level. Conus and cauda equina appear normal.   Paraspinal and other soft tissues: Negative. 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   Disc levels:   T12- L1: Minor disc bulging   L1-L2: Mild disc bulging with small right paracentral protrusion   L2-L3: Minor disc bulging   L3-L4: Mild disc bulging   L4-L5: Disc space narrowing and bulging with small right paracentral protrusion. Mild facet spurring   L5-S1:Disc narrowing and mild  bulging.  Negative facets.   IMPRESSION: Ordinary lumbar spine degeneration with widely patent canal and foramina throughout the lumbar spine.   Transitional vertebra numbered S1.   PATIENT SURVEYS:  FOTO current score 69%, projected 68%   pt's self ratings of physical performance is higher than expected with goal score lower than current score FOTO: 09/16/2023- 52  COGNITION: Overall cognitive status: Within functional limits for tasks assessed     MUSCLE LENGTH: Hamstrings: Right 65 deg; Left  60 deg Thomas test: Right 10 deg; Left 5 deg  POSTURE: decreased lumbar lordosis  PALPATION: No tender points identified today  LUMBAR ROM: able to do a partial squat to pick up small object from the floor  AROM eval  Flexion 70   Extension 20 painful  Right lateral flexion 15  Left lateral flexion 25  Right rotation   Left rotation    (Blank rows = not tested)  TRUNK STRENGTH:  Decreased activation of transverse abdominus muscles; abdominals 4-/5; decreased activation of lumbar multifidi; trunk extensors 4-/5  LOWER EXTREMITY ROM:   Grossly WFLS, some hip stiffness with internal and external rotation  LOWER EXTREMITY MMT:  Able to rise sit to stand without UE use Poor balance with SLS (needs UE support):  right/left pelvic drop indicating glute medius weakness left more than rigth   MMT Right eval Left eval 12/24  Hip flexion 4 4 4   Hip extension 4- 4- 4- bil  Hip abduction 3+ 3- 4- Bil  Hip adduction     Hip internal rotation     Hip external rotation 4- 3+ 4- bil  Knee flexion 4 4 4  bil  Knee extension 4 4- 4 bil  Ankle dorsiflexion     Ankle plantarflexion     Ankle inversion     Ankle eversion      (Blank rows = not tested)  LUMBAR SPECIAL TESTS:  Straight leg raise test: Negative  FUNCTIONAL TESTS:  5x STS no hands 34.47 TUG 16.03 3 mwt 598 WIDE BASE OF SUPPORT,HEAVIER LEFT STRIKE 09/02/2023 5STS:20.16sec No UE support TUG:12.20 sec : 451ft :  952 ft; RPE 5 09/23/2023 TUG:15.11 sec 5STS:26.63 no UE support  09/25/23:  916 ffet 5/10 GAIT:  Comments: wide base of support, decreased gait speed, pelvic drop  TODAY'S TREATMENT:                                                                                                                              DATE:   10/02/23:Pt arrives for aquatic physical therapy. Treatment took place in 3.5-5.5 feet of water. Water temperature was. Pt entered the pool via stairs slowly and step to step with rails. Pt requires buoyancy of water for support and to offload joints with strengthening exercises.  Seated water bench with 75% submersion Pt performed seated LE AROM exercises 20x in all planes, with concurrent review of status. 75% water walking 10x in each direction with no rest break in between directions using single buoy wts for push/pull Standing UE arm movements 10x with single buoy UE wts. High knee marching holding single buoy hand floats 4 lengths. Hip 3 ways Bil 20x with UE support on wall. Standing at wall for core/lat press with single buoy wt 2x10. Standing arm forward/back sways with mild acceleration 20x with VC to stand still and not grip her toes. Educated pt in how to perform decompression positions on her own for when she begins her  independent exercise. We reviewed and perforned seated and horizontal positions. Horizontal decompression float with 100% flotation for pain management and to decrease CNS input with lateral sway from PTA 6 min.     09/25/2023 Discussion of response to land based exercise= always worse despite adjustments to number of reps, lying down (unloaded vs loaded) and type of exercise Response to aquatic= pain relief for hours and even days after Response to DN= sleeps better, pain relief even for a few days 6 MWT Review of progress toward goals Trigger Point Dry Needling  Subsequent Treatment: Instructions provided previously at initial dry needling treatment.    Patient Verbal Consent Given: Yes Education Handout Provided: Previously Provided Muscles Treated: bil lumbar multifidi marination Electrical Stimulation Performed: No Treatment Response/Outcome: decreased pain; improved lumbar fascial mobility   09/23/2023 NuStep L5 6 min- PT present to discuss status  TUG:15.11 sec 5STS:26.63 no UE support 6 inch step taps x 10 each Seated trunk extension at barre with blue loop 2 x 10 Unilateral 5# KB hold + hip flexion; unilateral support on barre x 20 each direction Hip abduction on airex 2 x 10 Airex step ups x 10 bilateral  3 way SB stretch x 8 each direction Seated hip abduction with red loop 2 x 10  Seated hamstring stretch 2 x 30 sec bilateral   PATIENT EDUCATION:  Education details: Educated patient on anatomy and physiology of current symptoms, prognosis, plan of care as well as initial self care strategies to promote recovery;  DN info Person educated: Patient Education method: Explanation Education comprehension: verbalized understanding  HOME EXERCISE PROGRAM: Decompression series see pt instructions Access Code: [email or web address] URL: [email or web address] Date: 08/02/2023 Prepared by: Darien Eden 12/3:  Limit to 5 reps with HEP secondary to flare ups following ex Exercises - Sit to Stand  - 1 x daily - 7 x weekly - 1 sets - 10 reps - Hooklying Clamshell with Resistance  - 1 x daily - 7 x weekly - 1 sets - 10 reps - Hooklying Isometric Hip Flexion with Opposite Arm  - 1 x daily - 7 x weekly - 1 sets - 10 reps - Seated Diagonal Chop with Medicine Ball  - 1 x daily - 7 x weekly - 4 sets - 5 reps - Standing Row with Anchored Resistance  - 1 x daily - 7 x weekly - 1 sets - 10 reps  ASSESSMENT:  CLINICAL IMPRESSION: Pt presents with sore back muscles due to recent fall on ice. This pain in no way prohibited her from performing any exercise today, infact she increased her reps on certain exercises and has begun to learn how  to independently get in & out of her decompression positions. Needs assistance to return to standing.    OBJECTIVE IMPAIRMENTS: decreased activity tolerance, difficulty walking, decreased ROM, decreased strength, impaired perceived functional ability, and pain.   ACTIVITY LIMITATIONS: sitting, sleeping, stairs, bed mobility, and locomotion level  PARTICIPATION LIMITATIONS: interpersonal relationship, community activity, and walking longer distances, spending time with granddaughter  PERSONAL FACTORS: 3+ comorbidities: fibromyalgia, frequent falls, time since onset, tremors, HTN, multi jt OA  are also affecting patient's functional outcome.   REHAB POTENTIAL: Good  CLINICAL DECISION MAKING: Stable/uncomplicated  EVALUATION COMPLEXITY: Low   GOALS: Goals reviewed with patient? Yes  SHORT TERM GOALS: Target date: 08/28/2023    The patient will demonstrate knowledge of basic self care strategies and exercises to promote healing  Baseline: Goal status: met 12/24  2.  25% improvement in  sleep reported /decreased back pain Baseline:  Goal status: ongoing  3.  The patient will have improved hip strength to at least 4-/5 needed for standing, walking longer distances and descending stairs at home and in the community  Baseline:  Goal status: met 12/24  4.  Improved LE and trunk strength with greater ease with sit to stand 5x in <25 sec Baseline:  Goal status: MET 09/02/2023   LONG TERM GOALS: Target date: 11/20/2023    The patient will be independent in an aquatic ex program Baseline:  Goal status: revised  2.  50% improvement in sleep/ due to decreased back pain Baseline:  Goal status: ongoing  3.  The patient will have improved hip strength to at least 4/5 needed for standing, walking longer distances and descending stairs at home and in the community  Baseline:  Goal status: ongoing 4.  The patient will increase walking program from 2x/week to 3-4x/week Baseline:   Goal status: met 1/8  5.  The patient will have improved trunk flexor and extensor muscle strength needed for lifting medium weight objects such as grocery bags, laundry and luggage  Baseline:  Goal status: it hurts but I do it met 1/8  6. The patient will have improved gait stamina and speed needed to ambulate > 1000 feet in 6 minutes  new        PLAN:  PT FREQUENCY: 1-2x/week  PT DURATION: 8 weeks  PLANNED INTERVENTIONS: 97164- PT Re-evaluation, 97110-Therapeutic exercises, 97530- Therapeutic activity, 97112- Neuromuscular re-education, 97535- Self Care, 16109- Manual therapy, [id number]- Aquatic Therapy, 97014- Electrical stimulation (unattended), [id number]- Electrical stimulation (manual), [id number]- Ultrasound, [id number]- Traction (mechanical), [id number]- Ionotophoresis 4mg /ml Dexamethasone, Patient/Family education, Taping, Dry Needling, Joint mobilization, Spinal mobilization, Cryotherapy, and Moist heat.  PLAN FOR NEXT SESSION:   continue aquatics PT; DN lumbar as needed  Bethanne Brooks, PTA 10/02/23 10:09 AM

## 2023-10-02 ENCOUNTER — Encounter: Payer: Self-pay | Admitting: Physical Therapy

## 2023-10-02 ENCOUNTER — Ambulatory Visit: Payer: Medicare Other | Admitting: Physical Therapy

## 2023-10-02 DIAGNOSIS — R293 Abnormal posture: Secondary | ICD-10-CM

## 2023-10-02 DIAGNOSIS — G8929 Other chronic pain: Secondary | ICD-10-CM

## 2023-10-02 DIAGNOSIS — M6281 Muscle weakness (generalized): Secondary | ICD-10-CM

## 2023-10-02 DIAGNOSIS — R279 Unspecified lack of coordination: Secondary | ICD-10-CM

## 2023-10-02 DIAGNOSIS — M5442 Lumbago with sciatica, left side: Secondary | ICD-10-CM | POA: Diagnosis not present

## 2023-10-02 DIAGNOSIS — R262 Difficulty in walking, not elsewhere classified: Secondary | ICD-10-CM

## 2023-10-04 ENCOUNTER — Ambulatory Visit: Payer: Medicare Other | Admitting: Neurology

## 2023-10-04 ENCOUNTER — Ambulatory Visit: Payer: Self-pay | Admitting: Internal Medicine

## 2023-10-04 ENCOUNTER — Ambulatory Visit: Payer: Medicare Other | Admitting: Physical Therapy

## 2023-10-04 ENCOUNTER — Encounter: Payer: Self-pay | Admitting: Internal Medicine

## 2023-10-04 NOTE — Telephone Encounter (Signed)
Copied from CRM [id number]. Topic: Clinical - Red Word Triage >> Oct 04, 2023  9:56 AM Pascal Lux wrote: Red Word that prompted transfer to Nurse Triage: Patient stated she is feeling weak and thinks it's from the flu and has a headache.   Chief Complaint: Increased weakness Symptoms: Weakness (chronic and worse due to recent illness), headache  Frequency: Constant  Disposition: [] ED /[] Urgent Care (no appt availability in office) / [x] Appointment(In office/virtual)/ []  [facility]/ [] Home Care/ [] Refused Recommended Disposition /[] [facility]/ []  Follow-up with PCP Additional Notes: Patient reports that she got sick with the flu 2 days ago and has been feeling increasingly weak. Patient states she is able to get up and walk around. Patient reports that weakness is an ongoing problem and that she is in physical therapy. Patient reports she is also experiencing a headache. Patient also concerned about her Lipid panel results and would like to discuss them with Dr. Okey Dupre. Appointment made for the patient and patient instructed to call back for any new or worsening symptoms.     Reason for Disposition  Weakness is a chronic symptom (recurrent or ongoing AND present > 4 weeks)  Answer Assessment - Initial Assessment Questions 1. DESCRIPTION: "Describe how you are feeling."     Headache, feels weak 2. SEVERITY: "How bad is it?"  "Can you stand and walk?"   - MILD (0-3): Feels weak or tired, but does not interfere with work, school or normal activities.   - MODERATE (4-7): Able to stand and walk; weakness interferes with work, school, or normal activities.   - SEVERE (8-10): Unable to stand or walk; unable to do usual activities.     Moderate 3. ONSET: "When did these symptoms begin?" (e.g., hours, days, weeks, months)     Months, worsened over the last week due to the flu 4. CAUSE: "What do you think is causing the weakness or fatigue?" (e.g., not drinking enough fluids,  medical problem, trouble sleeping)     Recent illness  5. NEW MEDICINES:  "Have you started on any new medicines recently?" (e.g., opioid pain medicines, benzodiazepines, muscle relaxants, antidepressants, antihistamines, neuroleptics, beta blockers)     No 6. OTHER SYMPTOMS: "Do you have any other symptoms?" (e.g., chest pain, fever, cough, SOB, vomiting, diarrhea, bleeding, other areas of pain)     Headache  7. PREGNANCY: "Is there any chance you are pregnant?" "When was your last menstrual period?"     No  Protocols used: Weakness (Generalized) and Fatigue-A-AH

## 2023-10-04 NOTE — Telephone Encounter (Signed)
Copied from CRM [id number]. Topic: Clinical - Lab/Test Results >> Oct 04, 2023  9:45 AM Mariah Snyder wrote: Reason for CRM: Patient called and requested for Dr. Okey Dupre to please review results of her lipid panel because her triglycerides high and they were reported normal. Patient requesting a call back for clarity.

## 2023-10-09 ENCOUNTER — Encounter: Payer: Self-pay | Admitting: Physical Therapy

## 2023-10-09 ENCOUNTER — Ambulatory Visit: Payer: Medicare Other | Admitting: Physical Therapy

## 2023-10-09 DIAGNOSIS — R262 Difficulty in walking, not elsewhere classified: Secondary | ICD-10-CM

## 2023-10-09 DIAGNOSIS — R293 Abnormal posture: Secondary | ICD-10-CM

## 2023-10-09 DIAGNOSIS — M5442 Lumbago with sciatica, left side: Secondary | ICD-10-CM | POA: Diagnosis not present

## 2023-10-09 DIAGNOSIS — G8929 Other chronic pain: Secondary | ICD-10-CM

## 2023-10-09 DIAGNOSIS — R279 Unspecified lack of coordination: Secondary | ICD-10-CM

## 2023-10-09 DIAGNOSIS — M6281 Muscle weakness (generalized): Secondary | ICD-10-CM

## 2023-10-09 NOTE — Therapy (Signed)
OUTPATIENT PHYSICAL THERAPY THORACOLUMBAR TREATMENT NOTE   Patient Name: Mariah Snyder MRN: [id number] DOB:1946-02-16, 78 y.o., female Today's Date: 10/09/2023    END OF SESSION:  PT End of Session - 10/09/23 0919     Visit Number 17    Date for PT Re-Evaluation 11/20/23    Authorization Type Medicare    Progress Note Due on Visit 20    PT Start Time 0919    PT Stop Time 1015    PT Time Calculation (min) 56 min    Activity Tolerance Patient tolerated treatment well    Behavior During Therapy WFL for tasks assessed/performed                       Past Medical History:  Diagnosis Date   Anemia    Anxiety    Arthritis    Asthma    Fibromyalgia    Gallstones    GERD (gastroesophageal reflux disease)    Hyperlipidemia    Hypertension    Hypothyroidism    Kidney stones    Peptic ulcer    Rectal prolapse    Sleep apnea    Urinary tract infection    Past Surgical History:  Procedure Laterality Date   bunyionectomy Left    CHOLECYSTECTOMY     ELBOW ARTHROSCOPY Left    LAPAROSCOPIC ASSISTED VAGINAL HYSTERECTOMY     SHOULDER ARTHROSCOPY Right    TONSILLECTOMY     Patient Active Problem List   Diagnosis Date Noted   Pain due to onychomycosis of toenails of both feet 07/15/2023   Constipation 09/14/2022   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/2021   Left hip pain 10/12/2021   High cholesterol 03/07/2021   Moderate persistent asthma without complication 11/01/2020   Bereavement 02/06/2019   Bilateral temporomandibular joint pain 02/18/2018   Right leg numbness 05/21/2017   Squamous blepharitis of upper and lower eyelids of both eyes 05/21/2017   Meibomian gland dysfunction (MGD) of upper and lower lids of both eyes 02/20/2017   Chronic left-sided low back pain with left-sided sciatica 12/31/2016   OAB (overactive bladder) 08/29/2015   Hallux limitus 01/24/2015   Balance problems 05/12/2013   Lichen sclerosus 05/12/2013   Healthcare maintenance 01/12/2013    Essential tremor 07/08/2012   Gastro-esophageal reflux disease with esophagitis 07/02/2011   Fibromyalgia 05/27/2011   Keratoconjunctivitis sicca 05/10/2011   Senile nuclear sclerosis 05/10/2011   Tear film insufficiency 05/10/2011   Essential (primary) hypertension 02/26/2011   Degenerative spinal arthritis 08/28/2000   Hashimoto's thyroiditis 08/28/1980    PCP: Hillard Danker MD  REFERRING PROVIDER: Hillard Danker MD  REFERRING DIAG: [phone], G89.29 chronic left sided low back pain with left sided sciatica  Rationale for Evaluation and Treatment: Rehabilitation  THERAPY DIAG:  Back pain; weakness ONSET DATE: 6 months  SUBJECTIVE:  SUBJECTIVE STATEMENT: My back is still sore from slipping on the ice, no better no worse.  PERTINENT HISTORY:  Pelvic floor PT with Sallyanne Havers had DN Fibromyalgia (hard to get out of bed in the morning); history of HTN, arthritis, asthma, chronic constipation, hypothyroidism; sleep apnea, essential tremor being treated with Botox (1st round didn't work)  overactive bladder, hysterectomy, and cholecystectomy   Volunteers at the hospital  Able to walk 2 miles 2x/week  Pt states she has been "lazy" about ex in the past  PAIN: 09/23/2023  Are you having pain? Back muscles very sore from fall on ice.  5/10 currently NPRS scale:   Pain location: back Pain orientation: Left and Lower  PAIN TYPE:sore Pain description: intermittent and sharp  Aggravating factors: sitting long periods, lying on sofa on back; sleeping on left side, turning over in bed Relieving factors: lying prone pressing up with hands (learned in PT years ago and still does them 1x/week 20 reps; sitting shorter periods; walk around short distances  PRECAUTIONS: fall risk    WEIGHT BEARING  RESTRICTIONS: No  FALLS:  Yes 3-4x in the last 6 months; fell 3 nights ago when she got up to go to the bathroom, toe slipped and hit back on the toilet, light bruise mid thoracic region left   LIVING ENVIRONMENT: Stairs: bedroom downstairs (avoids takes longer than it used to  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   PLOF: Independent  PATIENT GOALS: I hope the pain is gone;  I would like to walk more, ride a bicycle, sleep more  NEXT MD VISIT: call as needed  OBJECTIVE:  Note: Objective measures were completed at Evaluation unless otherwise noted.  DIAGNOSTIC FINDINGS:   11/02/2021  RI LUMBAR SPINE WITHOUT CONTRAST   TECHNIQUE: Multiplanar, multisequence MR imaging of the lumbar spine was performed. No intravenous contrast was administered.   COMPARISON:  X-ray lumbar 10/11/2021.   FINDINGS: Segmentation: Transitional lumbosacral vertebra numbered S1. This places hypoplastic ribs or unfused transverse processes at L1 by prior radiography.   Alignment:  Negative for listhesis or significant scoliosis   Vertebrae: No fracture, evidence of discitis, or aggressive bone lesion.   Conus medullaris and cauda equina: Conus extends to the L2 level. Conus and cauda equina appear normal.   Paraspinal and other soft tissues: Negative. 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   Disc levels:   T12- L1: Minor disc bulging   L1-L2: Mild disc bulging with small right paracentral protrusion   L2-L3: Minor disc bulging   L3-L4: Mild disc bulging   L4-L5: Disc space narrowing and bulging with small right paracentral protrusion. Mild facet spurring   L5-S1:Disc narrowing and mild bulging.  Negative facets.   IMPRESSION: Ordinary lumbar spine degeneration with widely patent canal and foramina  throughout the lumbar spine.   Transitional vertebra numbered S1.   PATIENT SURVEYS:  FOTO current score 69%, projected 68%   pt's self ratings of physical performance is higher than expected with goal score lower than current score FOTO: 09/16/2023- 52  COGNITION: Overall cognitive status: Within functional limits for tasks assessed     MUSCLE LENGTH: Hamstrings: Right 65 deg; Left 60 deg Thomas test: Right 10 deg; Left 5 deg  POSTURE: decreased lumbar lordosis  PALPATION: No tender points identified today  LUMBAR ROM: able  to do a partial squat to pick up small object from the floor  AROM eval  Flexion 70   Extension 20 painful  Right lateral flexion 15  Left lateral flexion 25  Right rotation   Left rotation    (Blank rows = not tested)  TRUNK STRENGTH:  Decreased activation of transverse abdominus muscles; abdominals 4-/5; decreased activation of lumbar multifidi; trunk extensors 4-/5  LOWER EXTREMITY ROM:   Grossly WFLS, some hip stiffness with internal and external rotation  LOWER EXTREMITY MMT:  Able to rise sit to stand without UE use Poor balance with SLS (needs UE support):  right/left pelvic drop indicating glute medius weakness left more than rigth   MMT Right eval Left eval 12/24  Hip flexion 4 4 4   Hip extension 4- 4- 4- bil  Hip abduction 3+ 3- 4- Bil  Hip adduction     Hip internal rotation     Hip external rotation 4- 3+ 4- bil  Knee flexion 4 4 4  bil  Knee extension 4 4- 4 bil  Ankle dorsiflexion     Ankle plantarflexion     Ankle inversion     Ankle eversion      (Blank rows = not tested)  LUMBAR SPECIAL TESTS:  Straight leg raise test: Negative  FUNCTIONAL TESTS:  5x STS no hands 34.47 TUG 16.03 3 mwt 598 WIDE BASE OF SUPPORT,HEAVIER LEFT STRIKE 09/02/2023 5STS:20.16sec No UE support TUG:12.20 sec : 493ft : 952 ft; RPE 5 09/23/2023 TUG:15.11 sec 5STS:26.63 no UE support  09/25/23:  916 ffet 5/10 GAIT:  Comments: wide  base of support, decreased gait speed, pelvic drop  TODAY'S TREATMENT:                                                                                                                              DATE:   10/09/23:Pt arrives for aquatic physical therapy. Treatment took place in 3.5-5.5 feet of water. Water temperature was. Pt entered the pool via stairs slowly and step to step with rails. Pt requires buoyancy of water for support and to offload joints with strengthening exercises.  Seated water bench with 75% submersion Pt performed seated LE AROM exercises 20x in all planes, with concurrent review of status. 75% water walking 10x in each direction with no rest break in between directions using single buoy wts for push/pull Standing UE arm movements 10x with single buoy UE wts. High knee marching holding single buoy hand floats 4 lengths. Hip 3 ways Bil 20x with UE support on wall. Standing at wall for core/lat press with single buoy wt 2x10. Standing arm forward/back sways holding the pink bells today with mild acceleration 20x with VC to stand still and not grip her toes.5 min hot tub jets on her LT lumbar instead of decompression (pt didn't want to get her hair wet today.)      10/02/23:Pt arrives for aquatic physical therapy. Treatment took place in 3.5-5.5 feet  of water. Water temperature was. Pt entered the pool via stairs slowly and step to step with rails. Pt requires buoyancy of water for support and to offload joints with strengthening exercises.  Seated water bench with 75% submersion Pt performed seated LE AROM exercises 20x in all planes, with concurrent review of status. 75% water walking 10x in each direction with no rest break in between directions using single buoy wts for push/pull Standing UE arm movements 10x with single buoy UE wts. High knee marching holding single buoy hand floats 4 lengths. Hip 3 ways Bil 20x with UE support on wall. Standing at wall for core/lat press with single  buoy wt 2x10. Standing arm forward/back sways with mild acceleration 20x with VC to stand still and not grip her toes. Educated pt in how to perform decompression positions on her own for when she begins her independent exercise. We reviewed and perforned seated and horizontal positions. Horizontal decompression float with 100% flotation for pain management and to decrease CNS input with lateral sway from PTA 6 min.      PATIENT EDUCATION:  Education details: Educated patient on anatomy and physiology of current symptoms, prognosis, plan of care as well as initial self care strategies to promote recovery;  DN info Person educated: Patient Education method: Explanation Education comprehension: verbalized understanding  HOME EXERCISE PROGRAM: Decompression series see pt instructions Access Code: [email or web address] URL: [email or web address] Date: 08/02/2023 Prepared by: Lavinia Sharps 12/3:  Limit to 5 reps with HEP secondary to flare ups following ex Exercises - Sit to Stand  - 1 x daily - 7 x weekly - 1 sets - 10 reps - Hooklying Clamshell with Resistance  - 1 x daily - 7 x weekly - 1 sets - 10 reps - Hooklying Isometric Hip Flexion with Opposite Arm  - 1 x daily - 7 x weekly - 1 sets - 10 reps - Seated Diagonal Chop with Medicine Ball  - 1 x daily - 7 x weekly - 4 sets - 5 reps - Standing Row with Anchored Resistance  - 1 x daily - 7 x weekly - 1 sets - 10 reps  ASSESSMENT:  CLINICAL IMPRESSION: Pt's LBP has been consistent since her last pool therapy. She does remain active although she does feel the cold weather has deterred her walking and that helps as well as her pool exercises. Pt continues to have no pain exercising in the pool.   OBJECTIVE IMPAIRMENTS: decreased activity tolerance, difficulty walking, decreased ROM, decreased strength, impaired perceived functional ability, and pain.   ACTIVITY LIMITATIONS: sitting, sleeping, stairs, bed mobility, and locomotion  level  PARTICIPATION LIMITATIONS: interpersonal relationship, community activity, and walking longer distances, spending time with granddaughter  PERSONAL FACTORS: 3+ comorbidities: fibromyalgia, frequent falls, time since onset, tremors, HTN, multi jt OA  are also affecting patient's functional outcome.   REHAB POTENTIAL: Good  CLINICAL DECISION MAKING: Stable/uncomplicated  EVALUATION COMPLEXITY: Low   GOALS: Goals reviewed with patient? Yes  SHORT TERM GOALS: Target date: 08/28/2023    The patient will demonstrate knowledge of basic self care strategies and exercises to promote healing  Baseline: Goal status: met 12/24  2.  25% improvement in sleep reported /decreased back pain Baseline:  Goal status: ongoing  3.  The patient will have improved hip strength to at least 4-/5 needed for standing, walking longer distances and descending stairs at home and in the community  Baseline:  Goal status: met 12/24  4.  Improved LE and trunk strength with  greater ease with sit to stand 5x in <25 sec Baseline:  Goal status: MET 09/02/2023   LONG TERM GOALS: Target date: 11/20/2023    The patient will be independent in an aquatic ex program Baseline:  Goal status: revised  2.  50% improvement in sleep/ due to decreased back pain Baseline:  Goal status: ongoing  3.  The patient will have improved hip strength to at least 4/5 needed for standing, walking longer distances and descending stairs at home and in the community  Baseline:  Goal status: ongoing 4.  The patient will increase walking program from 2x/week to 3-4x/week Baseline:  Goal status: met 1/8  5.  The patient will have improved trunk flexor and extensor muscle strength needed for lifting medium weight objects such as grocery bags, laundry and luggage  Baseline:  Goal status: it hurts but I do it met 1/8  6. The patient will have improved gait stamina and speed needed to ambulate > 1000 feet in 6 minutes   new        PLAN:  PT FREQUENCY: 1-2x/week  PT DURATION: 8 weeks  PLANNED INTERVENTIONS: 97164- PT Re-evaluation, 97110-Therapeutic exercises, 97530- Therapeutic activity, 97112- Neuromuscular re-education, 97535- Self Care, 16109- Manual therapy, [id number]- Aquatic Therapy, 97014- Electrical stimulation (unattended), [id number]- Electrical stimulation (manual), [id number]- Ultrasound, [id number]- Traction (mechanical), [id number]- Ionotophoresis 4mg /ml Dexamethasone, Patient/Family education, Taping, Dry Needling, Joint mobilization, Spinal mobilization, Cryotherapy, and Moist heat.  PLAN FOR NEXT SESSION:   continue aquatics PT; DN lumbar as needed  Ane Payment, PTA 10/09/23 11:18 AM

## 2023-10-10 NOTE — Therapy (Signed)
OUTPATIENT PHYSICAL THERAPY THORACOLUMBAR TREATMENT NOTE   Patient Name: Mariah Snyder MRN: [id number] DOB:1946-02-05, 78 y.o., female Today's Date: 10/11/2023    END OF SESSION:  PT End of Session - 10/11/23 1259     Visit Number 18    Date for PT Re-Evaluation 11/20/23    Authorization Type Medicare    Progress Note Due on Visit 20    PT Start Time 1300    PT Stop Time 1345    PT Time Calculation (min) 45 min    Activity Tolerance Patient tolerated treatment well    Behavior During Therapy WFL for tasks assessed/performed                        Past Medical History:  Diagnosis Date   Anemia    Anxiety    Arthritis    Asthma    Fibromyalgia    Gallstones    GERD (gastroesophageal reflux disease)    Hyperlipidemia    Hypertension    Hypothyroidism    Kidney stones    Peptic ulcer    Rectal prolapse    Sleep apnea    Urinary tract infection    Past Surgical History:  Procedure Laterality Date   bunyionectomy Left    CHOLECYSTECTOMY     ELBOW ARTHROSCOPY Left    LAPAROSCOPIC ASSISTED VAGINAL HYSTERECTOMY     SHOULDER ARTHROSCOPY Right    TONSILLECTOMY     Patient Active Problem List   Diagnosis Date Noted   Pain due to onychomycosis of toenails of both feet 07/15/2023   Constipation 09/14/2022   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/2021   Left hip pain 10/12/2021   High cholesterol 03/07/2021   Moderate persistent asthma without complication 11/01/2020   Bereavement 02/06/2019   Bilateral temporomandibular joint pain 02/18/2018   Right leg numbness 05/21/2017   Squamous blepharitis of upper and lower eyelids of both eyes 05/21/2017   Meibomian gland dysfunction (MGD) of upper and lower lids of both eyes 02/20/2017   Chronic left-sided low back pain with left-sided sciatica 12/31/2016   OAB (overactive bladder) 08/29/2015   Hallux limitus 01/24/2015   Balance problems 05/12/2013   Lichen sclerosus 05/12/2013   Healthcare maintenance  01/12/2013   Essential tremor 07/08/2012   Gastro-esophageal reflux disease with esophagitis 07/02/2011   Fibromyalgia 05/27/2011   Keratoconjunctivitis sicca 05/10/2011   Senile nuclear sclerosis 05/10/2011   Tear film insufficiency 05/10/2011   Essential (primary) hypertension 02/26/2011   Degenerative spinal arthritis 08/28/2000   Hashimoto's thyroiditis 08/28/1980    PCP: Hillard Danker MD  REFERRING PROVIDER: Hillard Danker MD  REFERRING DIAG: [phone], G89.29 chronic left sided low back pain with left sided sciatica  Rationale for Evaluation and Treatment: Rehabilitation  THERAPY DIAG:  Back pain; weakness ONSET DATE: 6 months  SUBJECTIVE:  SUBJECTIVE STATEMENT: I definitely missed the float. My back does not do as good if I don't do that.   PERTINENT HISTORY:  Pelvic floor PT with Sallyanne Havers had DN Fibromyalgia (hard to get out of bed in the morning); history of HTN, arthritis, asthma, chronic constipation, hypothyroidism; sleep apnea, essential tremor being treated with Botox (1st round didn't work)  overactive bladder, hysterectomy, and cholecystectomy   Volunteers at the hospital  Able to walk 2 miles 2x/week  Pt states she has been "lazy" about ex in the past  PAIN: 09/23/2023  Are you having pain? Back muscles very sore from fall on ice.  5/10 currently NPRS scale:   Pain location: back Pain orientation: Left and Lower  PAIN TYPE:sore Pain description: intermittent and sharp  Aggravating factors: sitting long periods, lying on sofa on back; sleeping on left side, turning over in bed Relieving factors: lying prone pressing up with hands (learned in PT years ago and still does them 1x/week 20 reps; sitting shorter periods; walk around short distances  PRECAUTIONS: fall  risk    WEIGHT BEARING RESTRICTIONS: No  FALLS:  Yes 3-4x in the last 6 months; fell 3 nights ago when she got up to go to the bathroom, toe slipped and hit back on the toilet, light bruise mid thoracic region left   LIVING ENVIRONMENT: Stairs: bedroom downstairs (avoids takes longer than it used to  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   PLOF: Independent  PATIENT GOALS: I hope the pain is gone;  I would like to walk more, ride a bicycle, sleep more  NEXT MD VISIT: call as needed  OBJECTIVE:  Note: Objective measures were completed at Evaluation unless otherwise noted.  DIAGNOSTIC FINDINGS:   11/02/2021  RI LUMBAR SPINE WITHOUT CONTRAST   TECHNIQUE: Multiplanar, multisequence MR imaging of the lumbar spine was performed. No intravenous contrast was administered.   COMPARISON:  X-ray lumbar 10/11/2021.   FINDINGS: Segmentation: Transitional lumbosacral vertebra numbered S1. This places hypoplastic ribs or unfused transverse processes at L1 by prior radiography.   Alignment:  Negative for listhesis or significant scoliosis   Vertebrae: No fracture, evidence of discitis, or aggressive bone lesion.   Conus medullaris and cauda equina: Conus extends to the L2 level. Conus and cauda equina appear normal.   Paraspinal and other soft tissues: Negative. 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   Disc levels:   T12- L1: Minor disc bulging   L1-L2: Mild disc bulging with small right paracentral protrusion   L2-L3: Minor disc bulging   L3-L4: Mild disc bulging   L4-L5: Disc space narrowing and bulging with small right paracentral protrusion. Mild facet spurring   L5-S1:Disc narrowing and mild bulging.  Negative facets.   IMPRESSION: Ordinary lumbar spine degeneration with widely  patent canal and foramina throughout the lumbar spine.   Transitional vertebra numbered S1.   PATIENT SURVEYS:  FOTO current score 69%, projected 68%   pt's self ratings of physical performance is higher than expected with goal score lower than current score FOTO: 09/16/2023- 52  COGNITION: Overall cognitive status: Within functional limits for tasks assessed     MUSCLE LENGTH: Hamstrings: Right 65 deg; Left 60 deg Thomas test: Right 10 deg; Left 5 deg  POSTURE: decreased lumbar lordosis  PALPATION: No tender points identified today  LUMBAR ROM: able to do a partial squat to pick up small object from the floor  AROM eval  Flexion 70   Extension 20 painful  Right lateral flexion 15  Left lateral flexion 25  Right rotation   Left rotation    (Blank rows = not tested)  TRUNK STRENGTH:  Decreased activation of transverse abdominus muscles; abdominals 4-/5; decreased activation of lumbar multifidi; trunk extensors 4-/5  LOWER EXTREMITY ROM:   Grossly WFLS, some hip stiffness with internal and external rotation  LOWER EXTREMITY MMT:  Able to rise sit to stand without UE use Poor balance with SLS (needs UE support):  right/left pelvic drop indicating glute medius weakness left more than rigth   MMT Right eval Left eval 12/24  Hip flexion 4 4 4   Hip extension 4- 4- 4- bil  Hip abduction 3+ 3- 4- Bil  Hip adduction     Hip internal rotation     Hip external rotation 4- 3+ 4- bil  Knee flexion 4 4 4  bil  Knee extension 4 4- 4 bil  Ankle dorsiflexion     Ankle plantarflexion     Ankle inversion     Ankle eversion      (Blank rows = not tested)  LUMBAR SPECIAL TESTS:  Straight leg raise test: Negative  FUNCTIONAL TESTS:  5x STS no hands 34.47 TUG 16.03 3 mwt 598 WIDE BASE OF SUPPORT,HEAVIER LEFT STRIKE 09/02/2023 5STS:20.16sec No UE support TUG:12.20 sec : 471ft : 952 ft; RPE 5 09/23/2023 TUG:15.11 sec 5STS:26.63 no UE support  09/25/23:  916 ffet  5/10 GAIT:  Comments: wide base of support, decreased gait speed, pelvic drop  TODAY'S TREATMENT:                                                                                                                              DATE:   10/11/23:Pt arrives for aquatic physical therapy. Treatment took place in 3.5-5.5 feet of water. Water temperature was. Pt entered the pool via stairs slowly and step to step with rails. Pt requires buoyancy of water for support and to offload joints with strengthening exercises.  Seated water bench with 75% submersion Pt performed seated LE AROM exercises 20x in all planes, with concurrent review of status. 75% water walking 10x in each direction with no rest break in between directions using single buoy wts for push/pull Standing UE arm movements 10x with single buoy UE wts. High knee marching holding single buoy hand floats 4 lengths. Hip 3 ways Bil 20x with UE support on wall. Standing at wall for core/lat press with single buoy wt 2x10. Standing arm forward/back sways holding the pink bells today with mild acceleration 20x with VC to stand still and not grip her toes. Horizontal decompression float with 100% flotation and PTA providing lateral sway to mobilize trunk.    10/09/23:Pt arrives for aquatic physical therapy. Treatment took place in 3.5-5.5 feet of water. Water temperature was.  Pt entered the pool via stairs slowly and step to step with rails. Pt requires buoyancy of water for support and to offload joints with strengthening exercises.  Seated water bench with 75% submersion Pt performed seated LE AROM exercises 20x in all planes, with concurrent review of status. 75% water walking 10x in each direction with no rest break in between directions using single buoy wts for push/pull Standing UE arm movements 10x with single buoy UE wts. High knee marching holding single buoy hand floats 4 lengths. Hip 3 ways Bil 20x with UE support on wall. Standing at wall for  core/lat press with single buoy wt 2x10. Standing arm forward/back sways holding the pink bells today with mild acceleration 20x with VC to stand still and not grip her toes.5 min hot tub jets on her LT lumbar instead of decompression (pt didn't want to get her hair wet today.)      10/02/23:Pt arrives for aquatic physical therapy. Treatment took place in 3.5-5.5 feet of water. Water temperature was. Pt entered the pool via stairs slowly and step to step with rails. Pt requires buoyancy of water for support and to offload joints with strengthening exercises.  Seated water bench with 75% submersion Pt performed seated LE AROM exercises 20x in all planes, with concurrent review of status. 75% water walking 10x in each direction with no rest break in between directions using single buoy wts for push/pull Standing UE arm movements 10x with single buoy UE wts. High knee marching holding single buoy hand floats 4 lengths. Hip 3 ways Bil 20x with UE support on wall. Standing at wall for core/lat press with single buoy wt 2x10. Standing arm forward/back sways with mild acceleration 20x with VC to stand still and not grip her toes. Educated pt in how to perform decompression positions on her own for when she begins her independent exercise. We reviewed and perforned seated and horizontal positions. Horizontal decompression float with 100% flotation for pain management and to decrease CNS input with lateral sway from PTA 6 min.      PATIENT EDUCATION:  Education details: Educated patient on anatomy and physiology of current symptoms, prognosis, plan of care as well as initial self care strategies to promote recovery;  DN info Person educated: Patient Education method: Explanation Education comprehension: verbalized understanding  HOME EXERCISE PROGRAM: Decompression series see pt instructions Access Code: [email or web address] URL: [email or web address] Date: 08/02/2023 Prepared by: Lavinia Sharps 12/3:   Limit to 5 reps with HEP secondary to flare ups following ex Exercises - Sit to Stand  - 1 x daily - 7 x weekly - 1 sets - 10 reps - Hooklying Clamshell with Resistance  - 1 x daily - 7 x weekly - 1 sets - 10 reps - Hooklying Isometric Hip Flexion with Opposite Arm  - 1 x daily - 7 x weekly - 1 sets - 10 reps - Seated Diagonal Chop with Medicine Ball  - 1 x daily - 7 x weekly - 4 sets - 5 reps - Standing Row with Anchored Resistance  - 1 x daily - 7 x weekly - 1 sets - 10 reps  ASSESSMENT:  CLINICAL IMPRESSION: Pt reports skipping her decompression float did not giver her the back pain relief she typically gets when w she does float.  OBJECTIVE IMPAIRMENTS: decreased activity tolerance, difficulty walking, decreased ROM, decreased strength, impaired perceived functional ability, and pain.   ACTIVITY LIMITATIONS: sitting, sleeping, stairs, bed mobility, and locomotion level  PARTICIPATION LIMITATIONS: interpersonal relationship,  community activity, and walking longer distances, spending time with granddaughter  PERSONAL FACTORS: 3+ comorbidities: fibromyalgia, frequent falls, time since onset, tremors, HTN, multi jt OA  are also affecting patient's functional outcome.   REHAB POTENTIAL: Good  CLINICAL DECISION MAKING: Stable/uncomplicated  EVALUATION COMPLEXITY: Low   GOALS: Goals reviewed with patient? Yes  SHORT TERM GOALS: Target date: 08/28/2023    The patient will demonstrate knowledge of basic self care strategies and exercises to promote healing  Baseline: Goal status: met 12/24  2.  25% improvement in sleep reported /decreased back pain Baseline:  Goal status: ongoing  3.  The patient will have improved hip strength to at least 4-/5 needed for standing, walking longer distances and descending stairs at home and in the community  Baseline:  Goal status: met 12/24  4.  Improved LE and trunk strength with greater ease with sit to stand 5x in <25 sec Baseline:  Goal  status: MET 09/02/2023   LONG TERM GOALS: Target date: 11/20/2023    The patient will be independent in an aquatic ex program Baseline:  Goal status: revised  2.  50% improvement in sleep/ due to decreased back pain Baseline:  Goal status: ongoing  3.  The patient will have improved hip strength to at least 4/5 needed for standing, walking longer distances and descending stairs at home and in the community  Baseline:  Goal status: ongoing 4.  The patient will increase walking program from 2x/week to 3-4x/week Baseline:  Goal status: met 1/8  5.  The patient will have improved trunk flexor and extensor muscle strength needed for lifting medium weight objects such as grocery bags, laundry and luggage  Baseline:  Goal status: it hurts but I do it met 1/8  6. The patient will have improved gait stamina and speed needed to ambulate > 1000 feet in 6 minutes  new        PLAN:  PT FREQUENCY: 1-2x/week  PT DURATION: 8 weeks  PLANNED INTERVENTIONS: 97164- PT Re-evaluation, 97110-Therapeutic exercises, 97530- Therapeutic activity, 97112- Neuromuscular re-education, 97535- Self Care, 16109- Manual therapy, [id number]- Aquatic Therapy, 97014- Electrical stimulation (unattended), [id number]- Electrical stimulation (manual), [id number]- Ultrasound, [id number]- Traction (mechanical), [id number]- Ionotophoresis 4mg /ml Dexamethasone, Patient/Family education, Taping, Dry Needling, Joint mobilization, Spinal mobilization, Cryotherapy, and Moist heat.  PLAN FOR NEXT SESSION:   continue aquatics PT; DN lumbar as needed  Ane Payment, PTA 10/11/23 3:56 PM

## 2023-10-11 ENCOUNTER — Ambulatory Visit: Payer: Medicare Other | Admitting: Physical Therapy

## 2023-10-11 ENCOUNTER — Encounter: Payer: Self-pay | Admitting: Physical Therapy

## 2023-10-11 ENCOUNTER — Telehealth: Payer: Self-pay | Admitting: Neurology

## 2023-10-11 DIAGNOSIS — M6281 Muscle weakness (generalized): Secondary | ICD-10-CM

## 2023-10-11 DIAGNOSIS — R293 Abnormal posture: Secondary | ICD-10-CM

## 2023-10-11 DIAGNOSIS — R279 Unspecified lack of coordination: Secondary | ICD-10-CM

## 2023-10-11 DIAGNOSIS — R262 Difficulty in walking, not elsewhere classified: Secondary | ICD-10-CM

## 2023-10-11 DIAGNOSIS — M5442 Lumbago with sciatica, left side: Secondary | ICD-10-CM | POA: Diagnosis not present

## 2023-10-11 DIAGNOSIS — G8929 Other chronic pain: Secondary | ICD-10-CM

## 2023-10-11 NOTE — Telephone Encounter (Signed)
-----   Message from [place] Daphney Hopke sent at 09/05/2023  8:00 AM EST ----- Call patient.  She wanted to do botox one more time to see if helpful and if not, she will need to return to neurosx.  They didn't want to do injections on neck until they saw if botox would fix the issue.

## 2023-10-11 NOTE — Telephone Encounter (Signed)
Patient said she feels her neck is better she is still having chattering in her teeth but she overall feels better and would like to continue

## 2023-10-16 ENCOUNTER — Ambulatory Visit: Payer: Self-pay | Admitting: Physical Therapy

## 2023-10-18 ENCOUNTER — Encounter: Payer: Self-pay | Admitting: Internal Medicine

## 2023-10-18 ENCOUNTER — Ambulatory Visit (INDEPENDENT_AMBULATORY_CARE_PROVIDER_SITE_OTHER): Payer: Medicare Other | Admitting: Internal Medicine

## 2023-10-18 VITALS — BP 124/80 | HR 68 | Temp 97.7°F | Ht 64.0 in | Wt 159.0 lb

## 2023-10-18 DIAGNOSIS — H9313 Tinnitus, bilateral: Secondary | ICD-10-CM | POA: Diagnosis not present

## 2023-10-18 DIAGNOSIS — H9319 Tinnitus, unspecified ear: Secondary | ICD-10-CM | POA: Insufficient documentation

## 2023-10-18 DIAGNOSIS — M47819 Spondylosis without myelopathy or radiculopathy, site unspecified: Secondary | ICD-10-CM | POA: Diagnosis not present

## 2023-10-18 DIAGNOSIS — D692 Other nonthrombocytopenic purpura: Secondary | ICD-10-CM | POA: Insufficient documentation

## 2023-10-18 NOTE — Patient Instructions (Signed)
We do not need any changes today.

## 2023-10-18 NOTE — Progress Notes (Signed)
   Subjective:   Patient ID: Mariah Snyder, female    DOB: Apr 30, 1946, 78 y.o.   MRN: [id number]  HPI The patient is a 78 YO female coming in for discussion about labs from Nov 2024 and had flu recently still feeling weak and poorly. She is also having new tinnitus and some bruising on her hands.   Review of Systems  Constitutional:  Positive for fatigue.  HENT:  Positive for tinnitus.   Eyes: Negative.   Respiratory:  Negative for cough, chest tightness and shortness of breath.   Cardiovascular:  Negative for chest pain, palpitations and leg swelling.  Gastrointestinal:  Negative for abdominal distention, abdominal pain, constipation, diarrhea, nausea and vomiting.  Musculoskeletal: Negative.   Skin: Negative.   Neurological: Negative.   Hematological:  Bruises/bleeds easily.  Psychiatric/Behavioral: Negative.      Objective:  Physical Exam Constitutional:      Appearance: She is well-developed.  HENT:     Head: Normocephalic and atraumatic.     Right Ear: Tympanic membrane and ear canal normal. There is no impacted cerumen.     Left Ear: Tympanic membrane and ear canal normal. There is no impacted cerumen.  Cardiovascular:     Rate and Rhythm: Normal rate and regular rhythm.  Pulmonary:     Effort: Pulmonary effort is normal. No respiratory distress.     Breath sounds: Normal breath sounds. No wheezing or rales.  Abdominal:     General: Bowel sounds are normal. There is no distension.     Palpations: Abdomen is soft.     Tenderness: There is no abdominal tenderness. There is no rebound.  Musculoskeletal:     Cervical back: Normal range of motion.  Skin:    General: Skin is warm and dry.     Comments: Several small bruises on the back of her right hand  Neurological:     Mental Status: She is alert and oriented to person, place, and time.     Coordination: Coordination normal.     Vitals:   10/18/23 1022  BP: 124/80  Pulse: 68  Temp: 97.7 F (36.5 C)  TempSrc:  Oral  SpO2: 98%  Weight: 159 lb (72.1 kg)  Height: 5\' 4"  (1.626 m)    Assessment & Plan:  Visit time 20 minutes in face to face communication with patient and coordination of care, additional 10 minutes spent in record review, coordination or care, ordering tests, communicating/referring to other healthcare professionals, documenting in medical records all on the same day of the visit for total time 30 minutes spent on the visit.

## 2023-10-18 NOTE — Assessment & Plan Note (Signed)
Prior audiology several years ago and she did not think highly of this as she was having hearing problems at the time and was told her hearing was fine. Reassurance given about etiology. Strategies including distracting sounds at bedtime shared.

## 2023-10-18 NOTE — Assessment & Plan Note (Signed)
Reassurance given that recent CBC normal and this is likely normal bruising with potential aspirin 81 mg contributing.

## 2023-10-18 NOTE — Assessment & Plan Note (Signed)
She is still getting benefit from PT and dry needling and pool PT but has been told by pt that she cannot continue. I am unsure if she has a limit on session by insurance or has stopped making gains it could be either. Asked her to ask pt and if needed we can do appeal letter if this is needed.

## 2023-10-21 ENCOUNTER — Telehealth: Payer: Self-pay | Admitting: Neurology

## 2023-10-21 NOTE — Telephone Encounter (Signed)
Called patient and we were disconnected tried to call back no answer

## 2023-10-21 NOTE — Telephone Encounter (Signed)
Patient states she needs a certain medication and she is wanting Dr.Tat to give it to her.  Pregabalin 75mg  1 capsule twice daily.  Her old neuro at [facility] gave her this and she needs to find someone to give to her. She would like a call to discuss.  She doesn't have any, and is wandering if tat will give her some to a local pharmacy until she can get it bu mail.

## 2023-10-22 ENCOUNTER — Telehealth: Payer: Self-pay | Admitting: Internal Medicine

## 2023-10-22 ENCOUNTER — Other Ambulatory Visit: Payer: Self-pay | Admitting: Internal Medicine

## 2023-10-22 ENCOUNTER — Telehealth: Payer: Self-pay

## 2023-10-22 MED ORDER — PREGABALIN 75 MG PO CAPS: 75.0000 mg | ORAL_CAPSULE | Freq: Two times a day (BID) | ORAL | 0 refills | Status: DC

## 2023-10-22 NOTE — Telephone Encounter (Signed)
Called patient to let her know that Dr. Arbutus Leas is not going to refill the Lyrica and she should reach out to her PCP.

## 2023-10-22 NOTE — Telephone Encounter (Signed)
Sent in, please for future allow 3-4 days for refills for controlled substances

## 2023-10-22 NOTE — Telephone Encounter (Signed)
Patient would like a call back from the nurse regarding this medication. Best callback is [phone].

## 2023-10-22 NOTE — Telephone Encounter (Signed)
Ok to send in to local pharmacy?

## 2023-10-22 NOTE — Telephone Encounter (Signed)
 Copied from CRM [id number]. Topic: Clinical - Prescription Issue >> Oct 22, 2023  8:57 AM Joanell B wrote: Reason for CRM: Pt stated that her refill request of her pregabalin  (LYRICA ) 75 MG capsule is on the way through the mail but is needing some to last her till her refill is delivered.

## 2023-10-22 NOTE — Addendum Note (Signed)
Addended by: Hillard Danker A on: 10/22/2023 01:49 PM   Modules accepted: Orders

## 2023-10-22 NOTE — Telephone Encounter (Signed)
 Copied from CRM [id number]. Topic: Clinical - Medication Refill >> Oct 22, 2023  9:01 AM Joanell NOVAK wrote: Most Recent Primary Care Visit:  Provider: ROLLENE NORRIS A  Department: [facility]  Visit Type: ACUTE  Date: 10/18/2023  Medication: pregabalin  (LYRICA ) 75 MG capsule  Has the patient contacted their pharmacy? Yes (Agent: If no, request that the patient contact the pharmacy for the refill. If patient does not wish to contact the pharmacy document the reason why and proceed with request.) (Agent: If yes, when and what did the pharmacy advise?) Pharmacy informed her that this is her last refill   Is this the correct pharmacy for this prescription? Yes If no, delete pharmacy and type the correct one.  This is the patient's preferred pharmacy:  [facility] - [place], [place] - [address] [place] [address] Phone: [phone] Fax: [phone]   Has the prescription been filled recently? Yes  Is the patient out of the medication? No  Has the patient been seen for an appointment in the last year OR does the patient have an upcoming appointment? Yes  Can we respond through MyChart?   Agent: Please be advised that Rx refills may take up to 3 business days. We ask that you follow-up with your pharmacy.

## 2023-10-22 NOTE — Telephone Encounter (Signed)
PT is calling someone back about her lyrica

## 2023-10-30 ENCOUNTER — Ambulatory Visit: Payer: Medicare Other | Attending: Internal Medicine | Admitting: Physical Therapy

## 2023-10-30 ENCOUNTER — Encounter: Payer: Self-pay | Admitting: Physical Therapy

## 2023-10-30 DIAGNOSIS — R262 Difficulty in walking, not elsewhere classified: Secondary | ICD-10-CM | POA: Diagnosis present

## 2023-10-30 DIAGNOSIS — R293 Abnormal posture: Secondary | ICD-10-CM | POA: Insufficient documentation

## 2023-10-30 DIAGNOSIS — R279 Unspecified lack of coordination: Secondary | ICD-10-CM | POA: Insufficient documentation

## 2023-10-30 DIAGNOSIS — M6281 Muscle weakness (generalized): Secondary | ICD-10-CM | POA: Insufficient documentation

## 2023-10-30 DIAGNOSIS — M5442 Lumbago with sciatica, left side: Secondary | ICD-10-CM | POA: Insufficient documentation

## 2023-10-30 DIAGNOSIS — G8929 Other chronic pain: Secondary | ICD-10-CM | POA: Insufficient documentation

## 2023-10-30 NOTE — Therapy (Signed)
OUTPATIENT PHYSICAL THERAPY THORACOLUMBAR TREATMENT NOTE   Patient Name: Mariah Snyder MRN: [id number] DOB:06-26-1946, 78 y.o., female Today's Date: 10/30/2023    END OF SESSION:  PT End of Session - 10/30/23 1024     Visit Number 19    Date for PT Re-Evaluation 11/20/23    Authorization Type Medicare    Progress Note Due on Visit 20    PT Start Time 0930    PT Stop Time 1017    PT Time Calculation (min) 47 min    Activity Tolerance Patient tolerated treatment well    Behavior During Therapy WFL for tasks assessed/performed                         Past Medical History:  Diagnosis Date   Anemia    Anxiety    Arthritis    Asthma    Fibromyalgia    Gallstones    GERD (gastroesophageal reflux disease)    Hyperlipidemia    Hypertension    Hypothyroidism    Kidney stones    Peptic ulcer    Rectal prolapse    Sleep apnea    Urinary tract infection    Past Surgical History:  Procedure Laterality Date   bunyionectomy Left    CHOLECYSTECTOMY     ELBOW ARTHROSCOPY Left    LAPAROSCOPIC ASSISTED VAGINAL HYSTERECTOMY     SHOULDER ARTHROSCOPY Right    TONSILLECTOMY     Patient Active Problem List   Diagnosis Date Noted   Senile purpura (HCC) 10/18/2023   Tinnitus 10/18/2023   Pain due to onychomycosis of toenails of both feet 07/15/2023   Constipation 09/14/2022   Left hip pain 10/12/2021   High cholesterol 03/07/2021   Moderate persistent asthma without complication 11/01/2020   Bereavement 02/06/2019   Bilateral temporomandibular joint pain 02/18/2018   Right leg numbness 05/21/2017   Squamous blepharitis of upper and lower eyelids of both eyes 05/21/2017   Meibomian gland dysfunction (MGD) of upper and lower lids of both eyes 02/20/2017   Chronic left-sided low back pain with left-sided sciatica 12/31/2016   OAB (overactive bladder) 08/29/2015   Hallux limitus 01/24/2015   Balance problems 05/12/2013   Lichen sclerosus 05/12/2013   Healthcare  maintenance 01/12/2013   Essential tremor 07/08/2012   Gastro-esophageal reflux disease with esophagitis 07/02/2011   Fibromyalgia 05/27/2011   Keratoconjunctivitis sicca 05/10/2011   Senile nuclear sclerosis 05/10/2011   Tear film insufficiency 05/10/2011   Essential (primary) hypertension 02/26/2011   Degenerative spinal arthritis 08/28/2000   Hashimoto's thyroiditis 08/28/1980    PCP: Hillard Danker MD  REFERRING PROVIDER: Hillard Danker MD  REFERRING DIAG: [phone], G89.29 chronic left sided low back pain with left sided sciatica  Rationale for Evaluation and Treatment: Rehabilitation  THERAPY DIAG:  Back pain; weakness ONSET DATE: 6 months  SUBJECTIVE:  SUBJECTIVE STATEMENT: [facility] not able [facility] because they have some kind of championships going on there. I definitely noticed a regression not being in the pool:   PERTINENT HISTORY:  Pelvic floor PT with Sallyanne Havers had DN Fibromyalgia (hard to get out of bed in the morning); history of HTN, arthritis, asthma, chronic constipation, hypothyroidism; sleep apnea, essential tremor being treated with Botox (1st round didn't work)  overactive bladder, hysterectomy, and cholecystectomy   Volunteers at the hospital  Able to walk 2 miles 2x/week  Pt states she has been "lazy" about ex in the past  PAIN: 09/23/2023  Are you having pain? Back muscles very sore from fall on ice.  410 currently NPRS scale:   Pain location: all over Pain orientation: Left and Lower  PAIN TYPE:sore Pain description: intermittent and sharp  Aggravating factors: sitting long periods, lying on sofa on back; sleeping on left side, turning over in bed Relieving factors: lying prone pressing up with hands (learned in PT years ago and still  does them 1x/week 20 reps; sitting shorter periods; walk around short distances  PRECAUTIONS: fall risk    WEIGHT BEARING RESTRICTIONS: No  FALLS:  Yes 3-4x in the last 6 months; fell 3 nights ago when she got up to go to the bathroom, toe slipped and hit back on the toilet, light bruise mid thoracic region left   LIVING ENVIRONMENT: Stairs: bedroom downstairs (avoids takes longer than it used to  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   PLOF: Independent  PATIENT GOALS: I hope the pain is gone;  I would like to walk more, ride a bicycle, sleep more  NEXT MD VISIT: call as needed  OBJECTIVE:  Note: Objective measures were completed at Evaluation unless otherwise noted.  DIAGNOSTIC FINDINGS:   11/02/2021  RI LUMBAR SPINE WITHOUT CONTRAST   TECHNIQUE: Multiplanar, multisequence MR imaging of the lumbar spine was performed. No intravenous contrast was administered.   COMPARISON:  X-ray lumbar 10/11/2021.   FINDINGS: Segmentation: Transitional lumbosacral vertebra numbered S1. This places hypoplastic ribs or unfused transverse processes at L1 by prior radiography.   Alignment:  Negative for listhesis or significant scoliosis   Vertebrae: No fracture, evidence of discitis, or aggressive bone lesion.   Conus medullaris and cauda equina: Conus extends to the L2 level. Conus and cauda equina appear normal.   Paraspinal and other soft tissues: Negative. 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   Disc levels:   T12- L1: Minor disc bulging   L1-L2: Mild disc bulging with small right paracentral protrusion   L2-L3: Minor disc bulging   L3-L4: Mild disc bulging   L4-L5: Disc space narrowing and bulging with small right paracentral protrusion. Mild facet spurring   L5-S1:Disc narrowing  and mild bulging.  Negative facets.   IMPRESSION: Ordinary lumbar spine degeneration with widely patent canal and foramina throughout the lumbar spine.   Transitional vertebra numbered S1.   PATIENT SURVEYS:  FOTO current score 69%, projected 68%   pt's self ratings of physical performance is higher than expected with goal score lower than current score FOTO: 09/16/2023- 52  COGNITION: Overall cognitive status: Within functional limits for tasks assessed     MUSCLE LENGTH: Hamstrings: Right 65 deg; Left 60 deg Thomas test: Right  10 deg; Left 5 deg  POSTURE: decreased lumbar lordosis  PALPATION: No tender points identified today  LUMBAR ROM: able to do a partial squat to pick up small object from the floor  AROM eval  Flexion 70   Extension 20 painful  Right lateral flexion 15  Left lateral flexion 25  Right rotation   Left rotation    (Blank rows = not tested)  TRUNK STRENGTH:  Decreased activation of transverse abdominus muscles; abdominals 4-/5; decreased activation of lumbar multifidi; trunk extensors 4-/5  LOWER EXTREMITY ROM:   Grossly WFLS, some hip stiffness with internal and external rotation  LOWER EXTREMITY MMT:  Able to rise sit to stand without UE use Poor balance with SLS (needs UE support):  right/left pelvic drop indicating glute medius weakness left more than rigth   MMT Right eval Left eval 12/24  Hip flexion 4 4 4   Hip extension 4- 4- 4- bil  Hip abduction 3+ 3- 4- Bil  Hip adduction     Hip internal rotation     Hip external rotation 4- 3+ 4- bil  Knee flexion 4 4 4  bil  Knee extension 4 4- 4 bil  Ankle dorsiflexion     Ankle plantarflexion     Ankle inversion     Ankle eversion      (Blank rows = not tested)  LUMBAR SPECIAL TESTS:  Straight leg raise test: Negative  FUNCTIONAL TESTS:  5x STS no hands 34.47 TUG 16.03 3 mwt 598 WIDE BASE OF SUPPORT,HEAVIER LEFT STRIKE 09/02/2023 5STS:20.16sec No UE support TUG:12.20 sec :  410ft : 952 ft; RPE 5 09/23/2023 TUG:15.11 sec 5STS:26.63 no UE support  09/25/23:  916 ffet 5/10 GAIT:  Comments: wide base of support, decreased gait speed, pelvic drop  TODAY'S TREATMENT:                                                                                                                              DATE:   10/30/23:Pt arrives for aquatic physical therapy. Treatment took place in 3.5-5.5 feet of water. Water temperature was 92 degrees F. Pt entered the pool via stairs slowly and step to step with rails. Pt requires buoyancy of water for support and to offload joints with strengthening exercises.  Seated water bench with 75% submersion Pt performed seated LE AROM exercises 20x in all planes, with concurrent review of status. 75% water walking 10x in each direction with no rest break in between directions using single buoy wts for push/pull. Standing UE arm movements 10x with single buoy UE wts. High knee marching holding single buoy hand floats 4 lengths. Hip 3 ways Bil 20x with UE support on wall. Standing at wall for core/lat press with single buoy wt 2x10. Standing arm forward/back sways holding the pink bells today with mild acceleration 20x with VC to stand still and not grip her toes. Seated decompression with large noodle (pt did not want to get her hair  wet/cold temps today). 6 min, VC on how to control her body better using her core.    10/11/23:Pt arrives for aquatic physical therapy. Treatment took place in 3.5-5.5 feet of water. Water temperature was. Pt entered the pool via stairs slowly and step to step with rails. Pt requires buoyancy of water for support and to offload joints with strengthening exercises.  Seated water bench with 75% submersion Pt performed seated LE AROM exercises 20x in all planes, with concurrent review of status. 75% water walking 10x in each direction with no rest break in between directions using single buoy wts for push/pull Standing UE arm  movements 10x with single buoy UE wts. High knee marching holding single buoy hand floats 4 lengths. Hip 3 ways Bil 20x with UE support on wall. Standing at wall for core/lat press with single buoy wt 2x10. Standing arm forward/back sways holding the pink bells today with mild acceleration 20x with VC to stand still and not grip her toes. Horizontal decompression float with 100% flotation and PTA providing lateral sway to mobilize trunk.    10/09/23:Pt arrives for aquatic physical therapy. Treatment took place in 3.5-5.5 feet of water. Water temperature was. Pt entered the pool via stairs slowly and step to step with rails. Pt requires buoyancy of water for support and to offload joints with strengthening exercises.  Seated water bench with 75% submersion Pt performed seated LE AROM exercises 20x in all planes, with concurrent review of status. 75% water walking 10x in each direction with no rest break in between directions using single buoy wts for push/pull Standing UE arm movements 10x with single buoy UE wts. High knee marching holding single buoy hand floats 4 lengths. Hip 3 ways Bil 20x with UE support on wall. Standing at wall for core/lat press with single buoy wt 2x10. Standing arm forward/back sways holding the pink bells today with mild acceleration 20x with VC to stand still and not grip her toes.5 min hot tub jets on her LT lumbar instead of decompression (pt didn't want to get her hair wet today.)      PATIENT EDUCATION:  Education details: Educated patient on anatomy and physiology of current symptoms, prognosis, plan of care as well as initial self care strategies to promote recovery;  DN info Person educated: Patient Education method: Explanation Education comprehension: verbalized understanding  HOME EXERCISE PROGRAM: Decompression series see pt instructions Access Code: [email or web address] URL: [email or web address] Date: 08/02/2023 Prepared by: Lavinia Sharps 12/3:  Limit  to 5 reps with HEP secondary to flare ups following ex Exercises - Sit to Stand  - 1 x daily - 7 x weekly - 1 sets - 10 reps - Hooklying Clamshell with Resistance  - 1 x daily - 7 x weekly - 1 sets - 10 reps - Hooklying Isometric Hip Flexion with Opposite Arm  - 1 x daily - 7 x weekly - 1 sets - 10 reps - Seated Diagonal Chop with Medicine Ball  - 1 x daily - 7 x weekly - 4 sets - 5 reps - Standing Row with Anchored Resistance  - 1 x daily - 7 x weekly - 1 sets - 10 reps  ASSESSMENT:  CLINICAL IMPRESSION: Pt returns to aquatic PT after 2 weeks break (could not get in on schedule). She was not able to continue her aquatic exercises bc [facility] special event for last 2 weeks. Pt reports more pain with her ADLS. She has difficulty controlling her core for  independent decompression floats. She did show improvement over time but side does not seem as strong as her right side.  OBJECTIVE IMPAIRMENTS: decreased activity tolerance, difficulty walking, decreased ROM, decreased strength, impaired perceived functional ability, and pain.   ACTIVITY LIMITATIONS: sitting, sleeping, stairs, bed mobility, and locomotion level  PARTICIPATION LIMITATIONS: interpersonal relationship, community activity, and walking longer distances, spending time with granddaughter  PERSONAL FACTORS: 3+ comorbidities: fibromyalgia, frequent falls, time since onset, tremors, HTN, multi jt OA  are also affecting patient's functional outcome.   REHAB POTENTIAL: Good  CLINICAL DECISION MAKING: Stable/uncomplicated  EVALUATION COMPLEXITY: Low   GOALS: Goals reviewed with patient? Yes  SHORT TERM GOALS: Target date: 08/28/2023    The patient will demonstrate knowledge of basic self care strategies and exercises to promote healing  Baseline: Goal status: met 12/24  2.  25% improvement in sleep reported /decreased back pain Baseline:  Goal status: ongoing  3.  The patient will have improved hip  strength to at least 4-/5 needed for standing, walking longer distances and descending stairs at home and in the community  Baseline:  Goal status: met 12/24  4.  Improved LE and trunk strength with greater ease with sit to stand 5x in <25 sec Baseline:  Goal status: MET 09/02/2023   LONG TERM GOALS: Target date: 11/20/2023    The patient will be independent in an aquatic ex program Baseline:  Goal status: revised  2.  50% improvement in sleep/ due to decreased back pain Baseline:  Goal status: ongoing  3.  The patient will have improved hip strength to at least 4/5 needed for standing, walking longer distances and descending stairs at home and in the community  Baseline:  Goal status: ongoing 4.  The patient will increase walking program from 2x/week to 3-4x/week Baseline:  Goal status: met 1/8  5.  The patient will have improved trunk flexor and extensor muscle strength needed for lifting medium weight objects such as grocery bags, laundry and luggage  Baseline:  Goal status: it hurts but I do it met 1/8  6. The patient will have improved gait stamina and speed needed to ambulate > 1000 feet in 6 minutes  new        PLAN:  PT FREQUENCY: 1-2x/week  PT DURATION: 8 weeks  PLANNED INTERVENTIONS: 97164- PT Re-evaluation, 97110-Therapeutic exercises, 97530- Therapeutic activity, 97112- Neuromuscular re-education, 97535- Self Care, [id number]- Manual therapy, [id number]- Aquatic Therapy, 97014- Electrical stimulation (unattended), [id number]- Electrical stimulation (manual), [id number]- Ultrasound, [id number]- Traction (mechanical), [id number]- Ionotophoresis 4mg /ml Dexamethasone, Patient/Family education, Taping, Dry Needling, Joint mobilization, Spinal mobilization, Cryotherapy, and Moist heat.  PLAN FOR NEXT SESSION:   continue aquatics PT; DN lumbar as needed  Ane Payment, PTA 10/30/23 10:25 AM

## 2023-11-06 ENCOUNTER — Ambulatory Visit: Payer: Medicare Other | Admitting: Physical Therapy

## 2023-11-08 ENCOUNTER — Ambulatory Visit: Payer: Medicare Other | Admitting: Physical Therapy

## 2023-11-08 ENCOUNTER — Encounter: Payer: Self-pay | Admitting: Physical Therapy

## 2023-11-08 DIAGNOSIS — R293 Abnormal posture: Secondary | ICD-10-CM

## 2023-11-08 DIAGNOSIS — R279 Unspecified lack of coordination: Secondary | ICD-10-CM

## 2023-11-08 DIAGNOSIS — G8929 Other chronic pain: Secondary | ICD-10-CM

## 2023-11-08 DIAGNOSIS — R262 Difficulty in walking, not elsewhere classified: Secondary | ICD-10-CM

## 2023-11-08 DIAGNOSIS — M6281 Muscle weakness (generalized): Secondary | ICD-10-CM

## 2023-11-08 DIAGNOSIS — M5442 Lumbago with sciatica, left side: Secondary | ICD-10-CM | POA: Diagnosis not present

## 2023-11-08 NOTE — Telephone Encounter (Signed)
Closing this encounter since this has been addressed

## 2023-11-08 NOTE — Therapy (Signed)
OUTPATIENT PHYSICAL THERAPY THORACOLUMBAR TREATMENT NOTE   Patient Name: Mariah Snyder MRN: [id number] DOB:[birth year], 78 y.o., female Today's Date: 11/08/2023    END OF SESSION:  PT End of Session - 11/08/23 1331     Visit Number 20    Date for PT Re-Evaluation 11/20/23    Authorization Type Medicare    Progress Note Due on Visit 20    PT Start Time 1330    PT Stop Time 1425    PT Time Calculation (min) 55 min    Activity Tolerance Patient tolerated treatment well    Behavior During Therapy WFL for tasks assessed/performed                          Past Medical History:  Diagnosis Date   Anemia    Anxiety    Arthritis    Asthma    Fibromyalgia    Gallstones    GERD (gastroesophageal reflux disease)    Hyperlipidemia    Hypertension    Hypothyroidism    Kidney stones    Peptic ulcer    Rectal prolapse    Sleep apnea    Urinary tract infection    Past Surgical History:  Procedure Laterality Date   bunyionectomy Left    CHOLECYSTECTOMY     ELBOW ARTHROSCOPY Left    LAPAROSCOPIC ASSISTED VAGINAL HYSTERECTOMY     SHOULDER ARTHROSCOPY Right    TONSILLECTOMY     Patient Active Problem List   Diagnosis Date Noted   Senile purpura (HCC) 10/18/2023   Tinnitus 10/18/2023   Pain due to onychomycosis of toenails of both feet 07/15/2023   Constipation 09/14/2022   Left hip pain 10/12/2021   High cholesterol 03/07/2021   Moderate persistent asthma without complication 11/01/2020   Bereavement 02/06/2019   Bilateral temporomandibular joint pain 02/18/2018   Right leg numbness 05/21/2017   Squamous blepharitis of upper and lower eyelids of both eyes 05/21/2017   Meibomian gland dysfunction (MGD) of upper and lower lids of both eyes 02/20/2017   Chronic left-sided low back pain with left-sided sciatica 12/31/2016   OAB (overactive bladder) 08/29/2015   Hallux limitus 01/24/2015   Balance problems 05/12/2013   Lichen sclerosus 05/12/2013    Healthcare maintenance 01/12/2013   Essential tremor 07/08/2012   Gastro-esophageal reflux disease with esophagitis 07/02/2011   Fibromyalgia 05/27/2011   Keratoconjunctivitis sicca 05/10/2011   Senile nuclear sclerosis 05/10/2011   Tear film insufficiency 05/10/2011   Essential (primary) hypertension 02/26/2011   Degenerative spinal arthritis 08/28/2000   Hashimoto's thyroiditis 08/28/1980    PCP: Hillard Danker MD  REFERRING PROVIDER: Hillard Danker MD  REFERRING DIAG: [phone], G89.29 chronic left sided low back pain with left sided sciatica  Rationale for Evaluation and Treatment: Rehabilitation  THERAPY DIAG:  Back pain; weakness ONSET DATE: 6 months  SUBJECTIVE:  SUBJECTIVE STATEMENT: I was tired for 2 days after the last pool. I felt like I had lost all my strength. I also think the sitting float twisted my back or something like that because it didn't feel good. It is much better now.   PERTINENT HISTORY:  Pelvic floor PT with Sallyanne Havers had DN Fibromyalgia (hard to get out of bed in the morning); history of HTN, arthritis, asthma, chronic constipation, hypothyroidism; sleep apnea, essential tremor being treated with Botox (1st round didn't work)  overactive bladder, hysterectomy, and cholecystectomy   Volunteers at the hospital  Able to walk 2 miles 2x/week  Pt states she has been "lazy" about ex in the past  PAIN: 09/23/2023  Are you having pain? Just a little .  2/3-10 currently NPRS scale:   Pain location: low back Pain orientation:  PAIN TYPE:sore Pain description: intermittent and sharp  Aggravating factors: sitting long periods, lying on sofa on back; sleeping on left side, turning over in bed Relieving factors: lying prone pressing up with hands (learned in PT years ago and  still does them 1x/week 20 reps; sitting shorter periods; walk around short distances  PRECAUTIONS: fall risk    WEIGHT BEARING RESTRICTIONS: No  FALLS:  Yes 3-4x in the last 6 months; fell 3 nights ago when she got up to go to the bathroom, toe slipped and hit back on the toilet, light bruise mid thoracic region left   LIVING ENVIRONMENT: Stairs: bedroom downstairs (avoids takes longer than it used to  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   PLOF: Independent  PATIENT GOALS: I hope the pain is gone;  I would like to walk more, ride a bicycle, sleep more  NEXT MD VISIT: call as needed  OBJECTIVE:  Note: Objective measures were completed at Evaluation unless otherwise noted.  DIAGNOSTIC FINDINGS:   11/02/2021  RI LUMBAR SPINE WITHOUT CONTRAST   TECHNIQUE: Multiplanar, multisequence MR imaging of the lumbar spine was performed. No intravenous contrast was administered.   COMPARISON:  X-ray lumbar 10/11/2021.   FINDINGS: Segmentation: Transitional lumbosacral vertebra numbered S1. This places hypoplastic ribs or unfused transverse processes at L1 by prior radiography.   Alignment:  Negative for listhesis or significant scoliosis   Vertebrae: No fracture, evidence of discitis, or aggressive bone lesion.   Conus medullaris and cauda equina: Conus extends to the L2 level. Conus and cauda equina appear normal.   Paraspinal and other soft tissues: Negative. 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   Disc levels:   T12- L1: Minor disc bulging   L1-L2: Mild disc bulging with small right paracentral protrusion   L2-L3: Minor disc bulging   L3-L4: Mild disc bulging   L4-L5: Disc space narrowing and bulging with small right paracentral protrusion. Mild facet spurring   L5-S1:Disc  narrowing and mild bulging.  Negative facets.   IMPRESSION: Ordinary lumbar spine degeneration with widely patent canal and foramina throughout the lumbar spine.   Transitional vertebra numbered S1.   PATIENT SURVEYS:  FOTO current score 69%, projected 68%   pt's self ratings of physical performance is higher than expected with goal score lower than current score FOTO: 09/16/2023- 52  COGNITION: Overall cognitive status: Within functional limits for tasks assessed     MUSCLE LENGTH: Hamstrings: Right 65 deg; Left 60 deg Maisie Fus  test: Right 10 deg; Left 5 deg  POSTURE: decreased lumbar lordosis  PALPATION: No tender points identified today  LUMBAR ROM: able to do a partial squat to pick up small object from the floor  AROM eval  Flexion 70   Extension 20 painful  Right lateral flexion 15  Left lateral flexion 25  Right rotation   Left rotation    (Blank rows = not tested)  TRUNK STRENGTH:  Decreased activation of transverse abdominus muscles; abdominals 4-/5; decreased activation of lumbar multifidi; trunk extensors 4-/5  LOWER EXTREMITY ROM:   Grossly WFLS, some hip stiffness with internal and external rotation  LOWER EXTREMITY MMT:  Able to rise sit to stand without UE use Poor balance with SLS (needs UE support):  right/left pelvic drop indicating glute medius weakness left more than rigth   MMT Right eval Left eval 12/24  Hip flexion 4 4 4   Hip extension 4- 4- 4- bil  Hip abduction 3+ 3- 4- Bil  Hip adduction     Hip internal rotation     Hip external rotation 4- 3+ 4- bil  Knee flexion 4 4 4  bil  Knee extension 4 4- 4 bil  Ankle dorsiflexion     Ankle plantarflexion     Ankle inversion     Ankle eversion      (Blank rows = not tested)  LUMBAR SPECIAL TESTS:  Straight leg raise test: Negative  FUNCTIONAL TESTS:  5x STS no hands 34.47 TUG 16.03 3 mwt 598 WIDE BASE OF SUPPORT,HEAVIER LEFT STRIKE 09/02/2023 5STS:20.16sec No UE support TUG:12.20  sec : 422ft : 952 ft; RPE 5 09/23/2023 TUG:15.11 sec 5STS:26.63 no UE support  09/25/23:  916 ffet 5/10 GAIT:  Comments: wide base of support, decreased gait speed, pelvic drop  TODAY'S TREATMENT:                                                                                                                              DATE:   11/08/23:Pt arrives for aquatic physical therapy. Treatment took place in 3.5-5.5 feet of water. Water temperature was 91 degrees F. Pt entered the pool via stairs slowly and step to step with rails. Pt requires buoyancy of water for support and to offload joints with strengthening exercises.  Seated water bench with 75% submersion Pt performed seated LE AROM exercises 20x in all planes, with concurrent review of status. 75% water walking 10x in each direction with no rest break in between directions using single buoy wts for push/pull. Standing UE arm movements 10x with single buoy UE wts. High knee marching holding single buoy hand floats 4 lengths. Hip 3 ways Bil 20x with UE support on wall. Standing at wall for core/lat press with single buoy wt 2x10. Standing arm forward/back sways holding the green bells today with mild acceleration 20x2 with VC to stand still and not grip her toes.   10/30/23:Pt arrives for aquatic physical therapy. Treatment took place  in 3.5-5.5 feet of water. Water temperature was 92 degrees F. Pt entered the pool via stairs slowly and step to step with rails. Pt requires buoyancy of water for support and to offload joints with strengthening exercises.  Seated water bench with 75% submersion Pt performed seated LE AROM exercises 20x in all planes, with concurrent review of status. 75% water walking 10x in each direction with no rest break in between directions using single buoy wts for push/pull. Standing UE arm movements 10x with single buoy UE wts. High knee marching holding single buoy hand floats 4 lengths. Hip 3 ways Bil 20x with UE  support on wall. Standing at wall for core/lat press with single buoy wt 2x10. Standing arm forward/back sways holding the pink bells today with mild acceleration 20x with VC to stand still and not grip her toes. Seated decompression with large noodle (pt did not want to get her hair wet/cold temps today). 6 min, VC on how to control her body better using her core.    10/11/23:Pt arrives for aquatic physical therapy. Treatment took place in 3.5-5.5 feet of water. Water temperature was. Pt entered the pool via stairs slowly and step to step with rails. Pt requires buoyancy of water for support and to offload joints with strengthening exercises.  Seated water bench with 75% submersion Pt performed seated LE AROM exercises 20x in all planes, with concurrent review of status. 75% water walking 10x in each direction with no rest break in between directions using single buoy wts for push/pull Standing UE arm movements 10x with single buoy UE wts. High knee marching holding single buoy hand floats 4 lengths. Hip 3 ways Bil 20x with UE support on wall. Standing at wall for core/lat press with single buoy wt 2x10. Standing arm forward/back sways holding the pink bells today with mild acceleration 20x with VC to stand still and not grip her toes. Horizontal decompression float with 100% flotation and PTA providing lateral sway to mobilize trunk.    PATIENT EDUCATION:  Education details: Educated patient on anatomy and physiology of current symptoms, prognosis, plan of care as well as initial self care strategies to promote recovery;  DN info Person educated: Patient Education method: Explanation Education comprehension: verbalized understanding  HOME EXERCISE PROGRAM: Decompression series see pt instructions Access Code: [email or web address] URL: [email or web address] Date: 08/02/2023 Prepared by: Lavinia Sharps 12/3:  Limit to 5 reps with HEP secondary to flare ups following ex Exercises - Sit to Stand   - 1 x daily - 7 x weekly - 1 sets - 10 reps - Hooklying Clamshell with Resistance  - 1 x daily - 7 x weekly - 1 sets - 10 reps - Hooklying Isometric Hip Flexion with Opposite Arm  - 1 x daily - 7 x weekly - 1 sets - 10 reps - Seated Diagonal Chop with Medicine Ball  - 1 x daily - 7 x weekly - 4 sets - 5 reps - Standing Row with Anchored Resistance  - 1 x daily - 7 x weekly - 1 sets - 10 reps  ASSESSMENT:  CLINICAL IMPRESSION: Although pt rated her back pain low, her water walking seemed cautious and maybe slower than she had walked in the past. There was no increased pain but definitely a caution throughout the session. Pt has been able to complete her normal weekly walks.   OBJECTIVE IMPAIRMENTS: decreased activity tolerance, difficulty walking, decreased ROM, decreased strength, impaired perceived functional ability, and pain.   ACTIVITY LIMITATIONS: sitting, sleeping,  stairs, bed mobility, and locomotion level  PARTICIPATION LIMITATIONS: interpersonal relationship, community activity, and walking longer distances, spending time with granddaughter  PERSONAL FACTORS: 3+ comorbidities: fibromyalgia, frequent falls, time since onset, tremors, HTN, multi jt OA  are also affecting patient's functional outcome.   REHAB POTENTIAL: Good  CLINICAL DECISION MAKING: Stable/uncomplicated  EVALUATION COMPLEXITY: Low   GOALS: Goals reviewed with patient? Yes  SHORT TERM GOALS: Target date: 08/28/2023    The patient will demonstrate knowledge of basic self care strategies and exercises to promote healing  Baseline: Goal status: met 12/24  2.  25% improvement in sleep reported /decreased back pain Baseline:  Goal status: ongoing  3.  The patient will have improved hip strength to at least 4-/5 needed for standing, walking longer distances and descending stairs at home and in the community  Baseline:  Goal status: met 12/24  4.  Improved LE and trunk strength with greater ease with sit to  stand 5x in <25 sec Baseline:  Goal status: MET 09/02/2023   LONG TERM GOALS: Target date: 11/20/2023    The patient will be independent in an aquatic ex program Baseline:  Goal status: revised  2.  50% improvement in sleep/ due to decreased back pain Baseline:  Goal status: ongoing  3.  The patient will have improved hip strength to at least 4/5 needed for standing, walking longer distances and descending stairs at home and in the community  Baseline:  Goal status: ongoing 4.  The patient will increase walking program from 2x/week to 3-4x/week Baseline:  Goal status: met 1/8  5.  The patient will have improved trunk flexor and extensor muscle strength needed for lifting medium weight objects such as grocery bags, laundry and luggage  Baseline:  Goal status: it hurts but I do it met 1/8  6. The patient will have improved gait stamina and speed needed to ambulate > 1000 feet in 6 minutes  new        PLAN:  PT FREQUENCY: 1-2x/week  PT DURATION: 8 weeks  PLANNED INTERVENTIONS: 97164- PT Re-evaluation, 97110-Therapeutic exercises, 97530- Therapeutic activity, 97112- Neuromuscular re-education, 97535- Self Care, 16109- Manual therapy, [id number]- Aquatic Therapy, 97014- Electrical stimulation (unattended), [id number]- Electrical stimulation (manual), [id number]- Ultrasound, [id number]- Traction (mechanical), [id number]- Ionotophoresis 4mg /ml Dexamethasone, Patient/Family education, Taping, Dry Needling, Joint mobilization, Spinal mobilization, Cryotherapy, and Moist heat.  PLAN FOR NEXT SESSION: Dn next session then return to aquatics. Pt feels like she would benefit from a 4-6 week extension since her recent flare up.  Ane Payment, PTA 11/08/23 3:10 PM

## 2023-11-12 ENCOUNTER — Ambulatory Visit: Payer: Medicare Other | Admitting: Physical Therapy

## 2023-11-12 DIAGNOSIS — M6281 Muscle weakness (generalized): Secondary | ICD-10-CM

## 2023-11-12 DIAGNOSIS — R293 Abnormal posture: Secondary | ICD-10-CM

## 2023-11-12 DIAGNOSIS — G8929 Other chronic pain: Secondary | ICD-10-CM

## 2023-11-12 DIAGNOSIS — M5442 Lumbago with sciatica, left side: Secondary | ICD-10-CM | POA: Diagnosis not present

## 2023-11-12 NOTE — Therapy (Signed)
 OUTPATIENT PHYSICAL THERAPY THORACOLUMBAR TREATMENT NOTE   Patient Name: Mariah Snyder MRN: [id number] DOB:06/24/1946, 78 y.o., female Today's Date: 11/12/2023    END OF SESSION:  PT End of Session - 11/12/23 0933     Visit Number 21    Date for PT Re-Evaluation 11/20/23    Authorization Type Medicare    Progress Note Due on Visit 30    PT Start Time 0931    PT Stop Time 1015    PT Time Calculation (min) 44 min    Activity Tolerance Patient tolerated treatment well                 Past Medical History:  Diagnosis Date   Anemia    Anxiety    Arthritis    Asthma    Fibromyalgia    Gallstones    GERD (gastroesophageal reflux disease)    Hyperlipidemia    Hypertension    Hypothyroidism    Kidney stones    Peptic ulcer    Rectal prolapse    Sleep apnea    Urinary tract infection    Past Surgical History:  Procedure Laterality Date   bunyionectomy Left    CHOLECYSTECTOMY     ELBOW ARTHROSCOPY Left    LAPAROSCOPIC ASSISTED VAGINAL HYSTERECTOMY     SHOULDER ARTHROSCOPY Right    TONSILLECTOMY     Patient Active Problem List   Diagnosis Date Noted   Senile purpura (HCC) 10/18/2023   Tinnitus 10/18/2023   Pain due to onychomycosis of toenails of both feet 07/15/2023   Constipation 09/14/2022   Left hip pain 10/12/2021   High cholesterol 03/07/2021   Moderate persistent asthma without complication 11/01/2020   Bereavement 02/06/2019   Bilateral temporomandibular joint pain 02/18/2018   Right leg numbness 05/21/2017   Squamous blepharitis of upper and lower eyelids of both eyes 05/21/2017   Meibomian gland dysfunction (MGD) of upper and lower lids of both eyes 02/20/2017   Chronic left-sided low back pain with left-sided sciatica 12/31/2016   OAB (overactive bladder) 08/29/2015   Hallux limitus 01/24/2015   Balance problems 05/12/2013   Lichen sclerosus 05/12/2013   Healthcare maintenance 01/12/2013   Essential tremor 07/08/2012   Gastro-esophageal  reflux disease with esophagitis 07/02/2011   Fibromyalgia 05/27/2011   Keratoconjunctivitis sicca 05/10/2011   Senile nuclear sclerosis 05/10/2011   Tear film insufficiency 05/10/2011   Essential (primary) hypertension 02/26/2011   Degenerative spinal arthritis 08/28/2000   Hashimoto's thyroiditis 08/28/1980    PCP: Hillard Danker MD  REFERRING PROVIDER: Hillard Danker MD  REFERRING DIAG: [phone], G89.29 chronic left sided low back pain with left sided sciatica  Rationale for Evaluation and Treatment: Rehabilitation  THERAPY DIAG:  Back pain; weakness ONSET DATE: 6 months  SUBJECTIVE:  SUBJECTIVE STATEMENT:  I don't feel like I'm any better.  When I go for a walk, I don't have any balance and I can't walk very far.  I lose my breath when I'm walking.  My back is killing me. I wake up at night b/c of the pain.  I talked to Dr. Okey Dupre and I told her I wasn't getting as well as I should. Your DN helps me and I slept better for 3 nights.   PERTINENT HISTORY:  Goes swimming at the aquatic center at the [facility] Pelvic floor PT with Sallyanne Havers had DN Fibromyalgia (hard to get out of bed in the morning); history of HTN, arthritis, asthma, chronic constipation, hypothyroidism; sleep apnea, essential tremor being treated with Botox (1st round didn't work)  overactive bladder, hysterectomy, and cholecystectomy   Volunteers at the hospital  Able to walk 2 miles 2x/week  Pt states she has been "lazy" about ex in the past  PAIN:  Are you having pain?  Yes NPRS scale:  not at rest sitting, standing/walking 4-5 Pain location: low back Pain orientation:  PAIN TYPE:sore Pain description: intermittent and sharp  Aggravating factors: sitting long periods, lying on sofa on back; sleeping on left side,  turning over in bed Relieving factors: lying prone pressing up with hands (learned in PT years ago and still does them 1x/week 20 reps; sitting shorter periods; walk around short distances  PRECAUTIONS: fall risk    WEIGHT BEARING RESTRICTIONS: No  FALLS:  Yes 3-4x in the last 6 months; fell 3 nights ago when she got up to go to the bathroom, toe slipped and hit back on the toilet, light bruise mid thoracic region left   LIVING ENVIRONMENT: Stairs: bedroom downstairs (avoids takes longer than it used to  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   PLOF: Independent  PATIENT GOALS: I hope the pain is gone;  I would like to walk more, ride a bicycle, sleep more  NEXT MD VISIT: call as needed  OBJECTIVE:  Note: Objective measures were completed at Evaluation unless otherwise noted.  DIAGNOSTIC FINDINGS:   11/02/2021  RI LUMBAR SPINE WITHOUT CONTRAST   TECHNIQUE: Multiplanar, multisequence MR imaging of the lumbar spine was performed. No intravenous contrast was administered.   COMPARISON:  X-ray lumbar 10/11/2021.   FINDINGS: Segmentation: Transitional lumbosacral vertebra numbered S1. This places hypoplastic ribs or unfused transverse processes at L1 by prior radiography.   Alignment:  Negative for listhesis or significant scoliosis   Vertebrae: No fracture, evidence of discitis, or aggressive bone lesion.   Conus medullaris and cauda equina: Conus extends to the L2 level. Conus and cauda equina appear normal.   Paraspinal and other soft tissues: Negative. 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   Disc levels:   T12- L1: Minor disc bulging   L1-L2: Mild disc bulging with small right paracentral protrusion   L2-L3: Minor disc bulging   L3-L4: Mild disc bulging   L4-L5: Disc space  narrowing and bulging with small right paracentral protrusion. Mild facet spurring   L5-S1:Disc narrowing and mild bulging.  Negative facets.   IMPRESSION: Ordinary lumbar spine degeneration with widely patent canal and foramina throughout the lumbar spine.   Transitional vertebra numbered S1.   PATIENT SURVEYS:  FOTO current score 69%, projected 68%   pt's  self ratings of physical performance is higher than expected with goal score lower than current score FOTO: 09/16/2023- 52  COGNITION: Overall cognitive status: Within functional limits for tasks assessed     MUSCLE LENGTH: Hamstrings: Right 65 deg; Left 60 deg Thomas test: Right 10 deg; Left 5 deg  POSTURE: decreased lumbar lordosis  PALPATION: No tender points identified today  LUMBAR ROM: able to do a partial squat to pick up small object from the floor  AROM eval  Flexion 70   Extension 20 painful  Right lateral flexion 15  Left lateral flexion 25  Right rotation   Left rotation    (Blank rows = not tested)  TRUNK STRENGTH:  Decreased activation of transverse abdominus muscles; abdominals 4-/5; decreased activation of lumbar multifidi; trunk extensors 4-/5  LOWER EXTREMITY ROM:   Grossly WFLS, some hip stiffness with internal and external rotation  LOWER EXTREMITY MMT:  Able to rise sit to stand without UE use Poor balance with SLS (needs UE support):  right/left pelvic drop indicating glute medius weakness left more than rigth   MMT Right eval Left eval 12/24  Hip flexion 4 4 4   Hip extension 4- 4- 4- bil  Hip abduction 3+ 3- 4- Bil  Hip adduction     Hip internal rotation     Hip external rotation 4- 3+ 4- bil  Knee flexion 4 4 4  bil  Knee extension 4 4- 4 bil  Ankle dorsiflexion     Ankle plantarflexion     Ankle inversion     Ankle eversion      (Blank rows = not tested)  LUMBAR SPECIAL TESTS:  Straight leg raise test: Negative  FUNCTIONAL TESTS:  5x STS no hands 34.47 TUG 16.03 3 mwt 598  WIDE BASE OF SUPPORT,HEAVIER LEFT STRIKE 09/02/2023 5STS:20.16sec No UE support TUG:12.20 sec : 464ft : 952 ft; RPE 5 09/23/2023 TUG:15.11 sec 5STS:26.63 no UE support  09/25/23:  916 ffet 5/10  GAIT:  Comments: wide base of support, decreased gait speed, pelvic drop  TODAY'S TREATMENT:                                                                                                                              DATE:  11/12/23 Discussion of status and response to treatment Discussed consistency in exercise (aquatic ex is best tolerated) Manual therapy: soft tissue mobilization to lumbar paraspinals and upper gluteals Trigger Point Dry Needling Subsequent Treatment: Instructions provided previously at initial dry needling treatment.  Patient Verbal Consent Given: Yes Education Handout Provided: Previously Provided Muscles Treated: bil lumbar multifidi marination and upper gluteals Electrical Stimulation Performed:yes 80 pps, 1.5 ma 8 min Treatment Response/Outcome: decreased pain; improved lumbar fascial mobility  11/08/23:Pt arrives for aquatic physical therapy. Treatment took place in 3.5-5.5 feet of water. Water temperature was 91 degrees F. Pt entered the pool via stairs slowly and step to step with rails. Pt requires buoyancy of water for support  and to offload joints with strengthening exercises.  Seated water bench with 75% submersion Pt performed seated LE AROM exercises 20x in all planes, with concurrent review of status. 75% water walking 10x in each direction with no rest break in between directions using single buoy wts for push/pull. Standing UE arm movements 10x with single buoy UE wts. High knee marching holding single buoy hand floats 4 lengths. Hip 3 ways Bil 20x with UE support on wall. Standing at wall for core/lat press with single buoy wt 2x10. Standing arm forward/back sways holding the green bells today with mild acceleration 20x2 with VC to stand still and not  grip her toes.   10/30/23:Pt arrives for aquatic physical therapy. Treatment took place in 3.5-5.5 feet of water. Water temperature was 92 degrees F. Pt entered the pool via stairs slowly and step to step with rails. Pt requires buoyancy of water for support and to offload joints with strengthening exercises.  Seated water bench with 75% submersion Pt performed seated LE AROM exercises 20x in all planes, with concurrent review of status. 75% water walking 10x in each direction with no rest break in between directions using single buoy wts for push/pull. Standing UE arm movements 10x with single buoy UE wts. High knee marching holding single buoy hand floats 4 lengths. Hip 3 ways Bil 20x with UE support on wall. Standing at wall for core/lat press with single buoy wt 2x10. Standing arm forward/back sways holding the pink bells today with mild acceleration 20x with VC to stand still and not grip her toes. Seated decompression with large noodle (pt did not want to get her hair wet/cold temps today). 6 min, VC on how to control her body better using her core.    10/11/23:Pt arrives for aquatic physical therapy. Treatment took place in 3.5-5.5 feet of water. Water temperature was. Pt entered the pool via stairs slowly and step to step with rails. Pt requires buoyancy of water for support and to offload joints with strengthening exercises.  Seated water bench with 75% submersion Pt performed seated LE AROM exercises 20x in all planes, with concurrent review of status. 75% water walking 10x in each direction with no rest break in between directions using single buoy wts for push/pull Standing UE arm movements 10x with single buoy UE wts. High knee marching holding single buoy hand floats 4 lengths. Hip 3 ways Bil 20x with UE support on wall. Standing at wall for core/lat press with single buoy wt 2x10. Standing arm forward/back sways holding the pink bells today with mild acceleration 20x with VC to stand still and  not grip her toes. Horizontal decompression float with 100% flotation and PTA providing lateral sway to mobilize trunk.    PATIENT EDUCATION:  Education details: Educated patient on anatomy and physiology of current symptoms, prognosis, plan of care as well as initial self care strategies to promote recovery;  DN info Person educated: Patient Education method: Explanation Education comprehension: verbalized understanding  HOME EXERCISE PROGRAM: Decompression series see pt instructions Access Code: [email or web address] URL: [email or web address] Date: 08/02/2023 Prepared by: Lavinia Sharps 12/3:  Limit to 5 reps with HEP secondary to flare ups following ex Exercises - Sit to Stand  - 1 x daily - 7 x weekly - 1 sets - 10 reps - Hooklying Clamshell with Resistance  - 1 x daily - 7 x weekly - 1 sets - 10 reps - Hooklying Isometric Hip Flexion with Opposite Arm  - 1 x daily - 7 x  weekly - 1 sets - 10 reps - Seated Diagonal Chop with Medicine Ball  - 1 x daily - 7 x weekly - 4 sets - 5 reps - Standing Row with Anchored Resistance  - 1 x daily - 7 x weekly - 1 sets - 10 reps  ASSESSMENT:  CLINICAL IMPRESSION:  Marlana Mckowen has had lots of ups and downs with flare ups over the course of treatment consistent with fibromyalgia and chronic pain syndrome. She has tolerated aquatic PT fairly well, much better than land based exercise.   She has previously had 3 day pain relief with DN.  Electrical stimulation added to indwelling needles for further impacts on pain relief and neuromuscular changes.  Will do full reassessment in 1 week.  OBJECTIVE IMPAIRMENTS: decreased activity tolerance, difficulty walking, decreased ROM, decreased strength, impaired perceived functional ability, and pain.   ACTIVITY LIMITATIONS: sitting, sleeping, stairs, bed mobility, and locomotion level  PARTICIPATION LIMITATIONS: interpersonal relationship, community activity, and walking longer distances, spending time with  granddaughter  PERSONAL FACTORS: 3+ comorbidities: fibromyalgia, frequent falls, time since onset, tremors, HTN, multi jt OA  are also affecting patient's functional outcome.   REHAB POTENTIAL: Good  CLINICAL DECISION MAKING: Stable/uncomplicated  EVALUATION COMPLEXITY: Low   GOALS: Goals reviewed with patient? Yes  SHORT TERM GOALS: Target date: 08/28/2023    The patient will demonstrate knowledge of basic self care strategies and exercises to promote healing  Baseline: Goal status: met 12/24  2.  25% improvement in sleep reported /decreased back pain Baseline:  Goal status: ongoing  3.  The patient will have improved hip strength to at least 4-/5 needed for standing, walking longer distances and descending stairs at home and in the community  Baseline:  Goal status: met 12/24  4.  Improved LE and trunk strength with greater ease with sit to stand 5x in <25 sec Baseline:  Goal status: MET 09/02/2023   LONG TERM GOALS: Target date: 11/20/2023    The patient will be independent in an aquatic ex program Baseline:  Goal status: revised  2.  50% improvement in sleep/ due to decreased back pain Baseline:  Goal status: ongoing  3.  The patient will have improved hip strength to at least 4/5 needed for standing, walking longer distances and descending stairs at home and in the community  Baseline:  Goal status: ongoing 4.  The patient will increase walking program from 2x/week to 3-4x/week Baseline:  Goal status: met 1/8  5.  The patient will have improved trunk flexor and extensor muscle strength needed for lifting medium weight objects such as grocery bags, laundry and luggage  Baseline:  Goal status: it hurts but I do it met 1/8  6. The patient will have improved gait stamina and speed needed to ambulate > 1000 feet in 6 minutes  new        PLAN:  PT FREQUENCY: 1-2x/week  PT DURATION: 8 weeks  PLANNED INTERVENTIONS: 97164- PT Re-evaluation,  97110-Therapeutic exercises, 97530- Therapeutic activity, 97112- Neuromuscular re-education, 97535- Self Care, 16109- Manual therapy, [id number]- Aquatic Therapy, 97014- Electrical stimulation (unattended), [id number]- Electrical stimulation (manual), [id number]- Ultrasound, [id number]- Traction (mechanical), [id number]- Ionotophoresis 4mg /ml Dexamethasone, Patient/Family education, Taping, Dry Needling, Joint mobilization, Spinal mobilization, Cryotherapy, and Moist heat.  PLAN FOR NEXT SESSION: DN and aquatic PT; will reassess next week to check progress with goals and functional measures; encourage pt to seek community aquatic ex programs  Lavinia Sharps, PT 11/12/23 5:22 PM Phone: [phone] Fax: [phone]

## 2023-11-15 ENCOUNTER — Encounter: Payer: Self-pay | Admitting: Physical Therapy

## 2023-11-15 ENCOUNTER — Ambulatory Visit: Payer: Medicare Other | Admitting: Physical Therapy

## 2023-11-15 DIAGNOSIS — R262 Difficulty in walking, not elsewhere classified: Secondary | ICD-10-CM

## 2023-11-15 DIAGNOSIS — M6281 Muscle weakness (generalized): Secondary | ICD-10-CM

## 2023-11-15 DIAGNOSIS — R293 Abnormal posture: Secondary | ICD-10-CM

## 2023-11-15 DIAGNOSIS — G8929 Other chronic pain: Secondary | ICD-10-CM

## 2023-11-15 DIAGNOSIS — R279 Unspecified lack of coordination: Secondary | ICD-10-CM

## 2023-11-15 DIAGNOSIS — M5442 Lumbago with sciatica, left side: Secondary | ICD-10-CM | POA: Diagnosis not present

## 2023-11-15 NOTE — Therapy (Addendum)
 OUTPATIENT PHYSICAL THERAPY THORACOLUMBAR TREATMENT NOTE/DISCHARGE SUMMARY   Patient Name: Mariah Snyder MRN: [id number] DOB:[birth year], 78 y.o., female Today's Date: 11/15/2023    END OF SESSION:  PT End of Session - 11/15/23 1722     Visit Number 22    Date for PT Re-Evaluation 11/20/23    Authorization Type Medicare    Progress Note Due on Visit 30    PT Start Time 1345    PT Stop Time 1430    PT Time Calculation (min) 45 min    Activity Tolerance Patient tolerated treatment well    Behavior During Therapy WFL for tasks assessed/performed                  Past Medical History:  Diagnosis Date   Anemia    Anxiety    Arthritis    Asthma    Fibromyalgia    Gallstones    GERD (gastroesophageal reflux disease)    Hyperlipidemia    Hypertension    Hypothyroidism    Kidney stones    Peptic ulcer    Rectal prolapse    Sleep apnea    Urinary tract infection    Past Surgical History:  Procedure Laterality Date   bunyionectomy Left    CHOLECYSTECTOMY     ELBOW ARTHROSCOPY Left    LAPAROSCOPIC ASSISTED VAGINAL HYSTERECTOMY     SHOULDER ARTHROSCOPY Right    TONSILLECTOMY     Patient Active Problem List   Diagnosis Date Noted   Senile purpura (HCC) 10/18/2023   Tinnitus 10/18/2023   Pain due to onychomycosis of toenails of both feet 07/15/2023   Constipation 09/14/2022   Left hip pain 10/12/2021   High cholesterol 03/07/2021   Moderate persistent asthma without complication 11/01/2020   Bereavement 02/06/2019   Bilateral temporomandibular joint pain 02/18/2018   Right leg numbness 05/21/2017   Squamous blepharitis of upper and lower eyelids of both eyes 05/21/2017   Meibomian gland dysfunction (MGD) of upper and lower lids of both eyes 02/20/2017   Chronic left-sided low back pain with left-sided sciatica 12/31/2016   OAB (overactive bladder) 08/29/2015   Hallux limitus 01/24/2015   Balance problems 05/12/2013   Lichen sclerosus 05/12/2013    Healthcare maintenance 01/12/2013   Essential tremor 07/08/2012   Gastro-esophageal reflux disease with esophagitis 07/02/2011   Fibromyalgia 05/27/2011   Keratoconjunctivitis sicca 05/10/2011   Senile nuclear sclerosis 05/10/2011   Tear film insufficiency 05/10/2011   Essential (primary) hypertension 02/26/2011   Degenerative spinal arthritis 08/28/2000   Hashimoto's thyroiditis 08/28/1980    PCP: Hillard Danker MD  REFERRING PROVIDER: Hillard Danker MD  REFERRING DIAG: [phone], G89.29 chronic left sided low back pain with left sided sciatica  Rationale for Evaluation and Treatment: Rehabilitation  THERAPY DIAG:  Back pain; weakness ONSET DATE: 6 months  SUBJECTIVE:  SUBJECTIVE STATEMENT: I did well after my last pool session. I was not sore in my back like that one time. I probably walked 3.5 miles at "work" yesterday.   PERTINENT HISTORY:  Goes swimming at the aquatic center at the [facility] Pelvic floor PT with Sallyanne Havers had DN Fibromyalgia (hard to get out of bed in the morning); history of HTN, arthritis, asthma, chronic constipation, hypothyroidism; sleep apnea, essential tremor being treated with Botox (1st round didn't work)  overactive bladder, hysterectomy, and cholecystectomy   Volunteers at the hospital  Able to walk 2 miles 2x/week  Pt states she has been "lazy" about ex in the past  PAIN:  Are you having pain?  Yes NPRS scale:  not at rest sitting, standing/walking 4-5 Pain location: low back Pain orientation:  PAIN TYPE:sore Pain description: intermittent and sharp  Aggravating factors: sitting long periods, lying on sofa on back; sleeping on left side, turning over in bed Relieving factors: lying prone pressing up with hands (learned in PT years ago and still does them  1x/week 20 reps; sitting shorter periods; walk around short distances  PRECAUTIONS: fall risk    WEIGHT BEARING RESTRICTIONS: No  FALLS:  Yes 3-4x in the last 6 months; fell 3 nights ago when she got up to go to the bathroom, toe slipped and hit back on the toilet, light bruise mid thoracic region left   LIVING ENVIRONMENT: Stairs: bedroom downstairs (avoids takes longer than it used to  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   PLOF: Independent  PATIENT GOALS: I hope the pain is gone;  I would like to walk more, ride a bicycle, sleep more  NEXT MD VISIT: call as needed  OBJECTIVE:  Note: Objective measures were completed at Evaluation unless otherwise noted.  DIAGNOSTIC FINDINGS:   11/02/2021  RI LUMBAR SPINE WITHOUT CONTRAST   TECHNIQUE: Multiplanar, multisequence MR imaging of the lumbar spine was performed. No intravenous contrast was administered.   COMPARISON:  X-ray lumbar 10/11/2021.   FINDINGS: Segmentation: Transitional lumbosacral vertebra numbered S1. This places hypoplastic ribs or unfused transverse processes at L1 by prior radiography.   Alignment:  Negative for listhesis or significant scoliosis   Vertebrae: No fracture, evidence of discitis, or aggressive bone lesion.   Conus medullaris and cauda equina: Conus extends to the L2 level. Conus and cauda equina appear normal.   Paraspinal and other soft tissues: Negative. 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   Disc levels:   T12- L1: Minor disc bulging   L1-L2: Mild disc bulging with small right paracentral protrusion   L2-L3: Minor disc bulging   L3-L4: Mild disc bulging   L4-L5: Disc space narrowing and bulging with small right paracentral protrusion. Mild facet spurring   L5-S1:Disc narrowing and mild  bulging.  Negative facets.   IMPRESSION: Ordinary lumbar spine degeneration with widely patent canal and foramina throughout the lumbar spine.   Transitional vertebra numbered S1.   PATIENT SURVEYS:  FOTO current score 69%, projected 68%   pt's self ratings of physical performance is higher than expected with goal score lower than current score FOTO: 09/16/2023- 52  COGNITION: Overall cognitive status: Within functional limits for tasks assessed     MUSCLE LENGTH: Hamstrings: Right 65 deg; Left 60 deg Thomas test: Right 10 deg; Left 5 deg  POSTURE: decreased lumbar lordosis  PALPATION: No tender points identified today  LUMBAR ROM: able to do a partial squat to pick up small object from the floor  AROM eval  Flexion 70   Extension 20 painful  Right lateral flexion 15  Left lateral flexion 25  Right rotation   Left rotation    (Blank rows = not tested)  TRUNK STRENGTH:  Decreased activation of transverse abdominus muscles; abdominals 4-/5; decreased activation of lumbar multifidi; trunk extensors 4-/5  LOWER EXTREMITY ROM:   Grossly WFLS, some hip stiffness with internal and external rotation  LOWER EXTREMITY MMT:  Able to rise sit to stand without UE use Poor balance with SLS (needs UE support):  right/left pelvic drop indicating glute medius weakness left more than rigth   MMT Right eval Left eval 12/24  Hip flexion 4 4 4   Hip extension 4- 4- 4- bil  Hip abduction 3+ 3- 4- Bil  Hip adduction     Hip internal rotation     Hip external rotation 4- 3+ 4- bil  Knee flexion 4 4 4  bil  Knee extension 4 4- 4 bil  Ankle dorsiflexion     Ankle plantarflexion     Ankle inversion     Ankle eversion      (Blank rows = not tested)  LUMBAR SPECIAL TESTS:  Straight leg raise test: Negative  FUNCTIONAL TESTS:  5x STS no hands 34.47 TUG 16.03 3 mwt 598 WIDE BASE OF SUPPORT,HEAVIER LEFT STRIKE 09/02/2023 5STS:20.16sec No UE support TUG:12.20 sec : 47ft :  952 ft; RPE 5 09/23/2023 TUG:15.11 sec 5STS:26.63 no UE support  09/25/23:  916 ffet 5/10  GAIT:  Comments: wide base of support, decreased gait speed, pelvic drop  TODAY'S TREATMENT:                                                                                                                              DATE:   11/15/23:Pt arrives for aquatic physical therapy. Treatment took place in 3.5-5.5 feet of water. Water temperature was 91 degrees F. Pt entered the pool via stairs slowly and step to step with rails. Pt requires buoyancy of water for support and to offload joints with strengthening exercises.  Seated water bench with 75% submersion Pt performed seated LE AROM exercises 20x in all planes, with concurrent review of status and her plans for future aquatic exercise.  75% water walking 10x in each direction with no rest break in between directions using single buoy wts for push/pull. Standing UE arm movements 10x with single buoy UE wts. High knee marching holding single buoy hand floats static today due to limites space in pool 2x10. Hip 3 ways Bil 20x with UE support on wall. Standing at wall for core/lat press with single buoy wt 2x10.    11/12/23 Discussion of status and response to treatment Discussed consistency in exercise (aquatic ex is best tolerated) Manual therapy: soft tissue mobilization to  lumbar paraspinals and upper gluteals Trigger Point Dry Needling Subsequent Treatment: Instructions provided previously at initial dry needling treatment.  Patient Verbal Consent Given: Yes Education Handout Provided: Previously Provided Muscles Treated: bil lumbar multifidi marination and upper gluteals Electrical Stimulation Performed:yes 80 pps, 1.5 ma 8 min Treatment Response/Outcome: decreased pain; improved lumbar fascial mobility  11/08/23:Pt arrives for aquatic physical therapy. Treatment took place in 3.5-5.5 feet of water. Water temperature was 91 degrees F. Pt entered the pool  via stairs slowly and step to step with rails. Pt requires buoyancy of water for support and to offload joints with strengthening exercises.  Seated water bench with 75% submersion Pt performed seated LE AROM exercises 20x in all planes, with concurrent review of status. 75% water walking 10x in each direction with no rest break in between directions using single buoy wts for push/pull. Standing UE arm movements 10x with single buoy UE wts. High knee marching holding single buoy hand floats 4 lengths. Hip 3 ways Bil 20x with UE support on wall. Standing at wall for core/lat press with single buoy wt 2x10. Standing arm forward/back sways holding the green bells today with mild acceleration 20x2 with VC to stand still and not grip her toes.   PATIENT EDUCATION:  Education details: Educated patient on anatomy and physiology of current symptoms, prognosis, plan of care as well as initial self care strategies to promote recovery;  DN info Person educated: Patient Education method: Explanation Education comprehension: verbalized understanding  HOME EXERCISE PROGRAM: Decompression series see pt instructions Access Code: [email or web address] URL: [email or web address] Date: 08/02/2023 Prepared by: Lavinia Sharps 12/3:  Limit to 5 reps with HEP secondary to flare ups following ex Exercises - Sit to Stand  - 1 x daily - 7 x weekly - 1 sets - 10 reps - Hooklying Clamshell with Resistance  - 1 x daily - 7 x weekly - 1 sets - 10 reps - Hooklying Isometric Hip Flexion with Opposite Arm  - 1 x daily - 7 x weekly - 1 sets - 10 reps - Seated Diagonal Chop with Medicine Ball  - 1 x daily - 7 x weekly - 4 sets - 5 reps - Standing Row with Anchored Resistance  - 1 x daily - 7 x weekly - 1 sets - 10 reps  ASSESSMENT:  CLINICAL IMPRESSION: Pt is independent in her aquatic exercises and plans to continue at the Aquatic Center. She may use it more during the winter so she keeps moving even when the weather gets  cold.   OBJECTIVE IMPAIRMENTS: decreased activity tolerance, difficulty walking, decreased ROM, decreased strength, impaired perceived functional ability, and pain.   ACTIVITY LIMITATIONS: sitting, sleeping, stairs, bed mobility, and locomotion level  PARTICIPATION LIMITATIONS: interpersonal relationship, community activity, and walking longer distances, spending time with granddaughter  PERSONAL FACTORS: 3+ comorbidities: fibromyalgia, frequent falls, time since onset, tremors, HTN, multi jt OA  are also affecting patient's functional outcome.   REHAB POTENTIAL: Good  CLINICAL DECISION MAKING: Stable/uncomplicated  EVALUATION COMPLEXITY: Low   GOALS: Goals reviewed with patient? Yes  SHORT TERM GOALS: Target date: 08/28/2023    The patient will demonstrate knowledge of basic self care strategies and exercises to promote healing  Baseline: Goal status: met 12/24  2.  25% improvement in sleep reported /decreased back pain Baseline:  Goal status: ongoing  3.  The patient will have improved hip strength to at least 4-/5 needed for standing, walking longer distances and descending stairs at home and in the community  Baseline:  Goal status: met 12/24  4.  Improved LE and trunk strength with greater ease with sit to stand 5x in <25 sec Baseline:  Goal status: MET 09/02/2023   LONG TERM GOALS: Target date: 11/20/2023    The patient will be independent in an aquatic ex program Baseline:  Goal status: revised  2.  50% improvement in sleep/ due to decreased back pain Baseline:  Goal status: ongoing  3.  The patient will have improved hip strength to at least 4/5 needed for standing, walking longer distances and descending stairs at home and in the community  Baseline:  Goal status: ongoing 4.  The patient will increase walking program from 2x/week to 3-4x/week Baseline:  Goal status: met 1/8  5.  The patient will have improved trunk flexor and extensor muscle strength  needed for lifting medium weight objects such as grocery bags, laundry and luggage  Baseline:  Goal status: it hurts but I do it met 1/8  6. The patient will have improved gait stamina and speed needed to ambulate > 1000 feet in 6 minutes  new        PLAN:  PT FREQUENCY: 1-2x/week  PT DURATION: 8 weeks  PLANNED INTERVENTIONS: 97164- PT Re-evaluation, 97110-Therapeutic exercises, 97530- Therapeutic activity, 97112- Neuromuscular re-education, 97535- Self Care, 16109- Manual therapy, [id number]- Aquatic Therapy, 97014- Electrical stimulation (unattended), [id number]- Electrical stimulation (manual), [id number]- Ultrasound, [id number]- Traction (mechanical), [id number]- Ionotophoresis 4mg /ml Dexamethasone, Patient/Family education, Taping, Dry Needling, Joint mobilization, Spinal mobilization, Cryotherapy, and Moist heat.    PLAN FOR NEXT SESSION: Discharge aquatics  Ane Payment, [place] 11/15/23 5:23 PM   PHYSICAL THERAPY DISCHARGE SUMMARY  Visits from Start of Care: 22  Current functional level related to goals / functional outcomes: The patient reports lack of improvement with PT   Remaining deficits: As above   Education / Equipment: HEP   Patient agrees to discharge. Patient goals were partially met. Patient is being discharged due to the patient's request.

## 2023-11-19 ENCOUNTER — Ambulatory Visit: Payer: Medicare Other | Admitting: Physical Therapy

## 2023-11-20 ENCOUNTER — Ambulatory Visit: Payer: Medicare Other | Admitting: Physical Therapy

## 2023-11-22 ENCOUNTER — Other Ambulatory Visit: Payer: Medicare Other

## 2023-12-05 ENCOUNTER — Ambulatory Visit (INDEPENDENT_AMBULATORY_CARE_PROVIDER_SITE_OTHER): Payer: Medicare Other | Admitting: Neurology

## 2023-12-05 DIAGNOSIS — G243 Spasmodic torticollis: Secondary | ICD-10-CM | POA: Diagnosis not present

## 2023-12-05 MED ORDER — ONABOTULINUMTOXINA 100 UNITS IJ SOLR
300.0000 [IU] | Freq: Once | INTRAMUSCULAR | Status: AC
  Administered 2023-12-05: 270 [IU] via INTRAMUSCULAR

## 2023-12-05 NOTE — Procedures (Signed)
 [facility]   Procedure Note Botox  Attending: Dr. Lurena Joiner Hera Celaya  Preoperative Diagnosis(es): Cervical Dystonia  Result History  Her hairdresser told her last injections were really helpful but she asks about increasing dose  Consent obtained from: The patient Benefits discussed included, but were not limited to decreased muscle tightness, increased joint range of motion, and decreased pain.  Risk discussed included, but were not limited pain and discomfort, bleeding, bruising, excessive weakness, venous thrombosis, muscle atrophy and dysphagia.  A copy of the patient medication guide was given to the patient which explains the blackbox warning.  Patients identity and treatment sites confirmed Yes.  .  Details of Procedure: Skin was cleaned with alcohol.  A 30 gauge, 25mm  needle was introduced to the target muscle, except for posterior splenius where 27 gauge, 1.5 inch needle used.   Prior to injection, the needle plunger was aspirated to make sure the needle was not within a blood vessel.  There was no blood retrieved on aspiration.    Following is a summary of the muscles injected  And the amount of Botulinum toxin used:   Dilution 0.9% preservative free saline mixed with 100 u Botox type A to make 10 U per 0.1cc  Injections  Location Left  Right Units Number of sites        Sternocleidomastoid 30 60 90 1 each  Splenius Capitus, posterior approach 100  100 1  Splenius Capitus, lateral approach 20  20 1         Trapezius 20/20 20 60         TOTAL UNITS:   270    Agent: Botulinum Type A ( Onobotulinum Toxin type A ).  3 vials of Botox were used, each containing 100 units and freshly diluted with 1 mL of sterile, non-preserved saline   Total injected (Units): 270  Total wasted (Units): 30   Pt tolerated procedure well without complications.   Reinjection is anticipated in 3 months.

## 2023-12-09 ENCOUNTER — Ambulatory Visit (INDEPENDENT_AMBULATORY_CARE_PROVIDER_SITE_OTHER): Payer: Medicare Other | Admitting: Podiatry

## 2023-12-09 ENCOUNTER — Other Ambulatory Visit: Payer: Self-pay | Admitting: Internal Medicine

## 2023-12-09 ENCOUNTER — Other Ambulatory Visit: Payer: Self-pay | Admitting: Neurology

## 2023-12-09 ENCOUNTER — Encounter: Payer: Self-pay | Admitting: Podiatry

## 2023-12-09 ENCOUNTER — Telehealth: Payer: Self-pay | Admitting: Pulmonary Disease

## 2023-12-09 DIAGNOSIS — J4540 Moderate persistent asthma, uncomplicated: Secondary | ICD-10-CM

## 2023-12-09 DIAGNOSIS — B351 Tinea unguium: Secondary | ICD-10-CM

## 2023-12-09 DIAGNOSIS — M79674 Pain in right toe(s): Secondary | ICD-10-CM

## 2023-12-09 DIAGNOSIS — M79675 Pain in left toe(s): Secondary | ICD-10-CM

## 2023-12-09 DIAGNOSIS — G250 Essential tremor: Secondary | ICD-10-CM

## 2023-12-09 NOTE — Telephone Encounter (Signed)
 Patient has been scheduled for Dr.Dewald in May.  Can she have a medication refill to hold her until then?  Please call patient if able to refill. Fluticasone -Salmat 230-21 mcg 1200 mhl (Advair)   [facility]

## 2023-12-09 NOTE — Progress Notes (Signed)
 Subjective: Chief Complaint  Patient presents with   RFC    RM#RFC patient states has no concerns just her routine foot care.    78 year old female with the above concerns.  She is nails are thickened elongated she not able to trim them herself.  She states the left big toenail will split. She has not been putting anything on it. No open lesions, injuries or other concerns.   Objective: AAO x3, NAD DP/PT pulses palpable bilaterally, CRT less than 3 seconds Nails are hypertrophic, dystrophic, brittle, discolored, elongated 10.  Incurvation of the nails without any signs of infection.  Mild vertical split of the distal aspect left hallux toenail.  Tenderness nails 1-5 bilaterally.  No open lesions or pre-ulcerative lesions are identified today.   No pain with calf compression, swelling, warmth, erythema  Assessment: Ingrown toenail, symptomatic onychomycosis  Plan: -All treatment options discussed with the patient including all alternatives, risks, complications.  -Sharply debrided nails x10 without any complications or bleeding.  Monitor ingrown toenails for any signs or symptoms of infection. She has been doing good.  Discussed nail strengthening to help with split nail. -Patient encouraged to call the office with any questions, concerns, change in symptoms.   Return in about 3 months (around 03/10/2024).  Vivi Barrack DPM

## 2023-12-09 NOTE — Telephone Encounter (Signed)
 LOV : 10/18/23 Last fill: 10/22/23, 28 capsule 0 refill

## 2023-12-11 ENCOUNTER — Encounter: Payer: Self-pay | Admitting: Internal Medicine

## 2023-12-11 ENCOUNTER — Telehealth: Payer: Self-pay | Admitting: Internal Medicine

## 2023-12-11 ENCOUNTER — Ambulatory Visit
Admission: RE | Admit: 2023-12-11 | Discharge: 2023-12-11 | Disposition: A | Discharge status: Home / Self Care | Source: Ambulatory Visit | Attending: Internal Medicine | Admitting: Internal Medicine

## 2023-12-11 ENCOUNTER — Other Ambulatory Visit: Payer: Self-pay | Admitting: Internal Medicine

## 2023-12-11 DIAGNOSIS — E2839 Other primary ovarian failure: Secondary | ICD-10-CM

## 2023-12-11 DIAGNOSIS — Z13820 Encounter for screening for osteoporosis: Secondary | ICD-10-CM

## 2023-12-11 LAB — HM DEXA SCAN: HM Dexa Scan: -1.6

## 2023-12-11 MED ORDER — BUSPIRONE HCL 5 MG PO TABS
5.0000 mg | ORAL_TABLET | Freq: Two times a day (BID) | ORAL | 0 refills | Status: DC
Start: 2023-12-11 — End: 2024-05-26

## 2023-12-11 MED ORDER — PREGABALIN 75 MG PO CAPS: 75.0000 mg | ORAL_CAPSULE | Freq: Two times a day (BID) | ORAL | 1 refills | Status: DC

## 2023-12-11 NOTE — Telephone Encounter (Signed)
 Caller & Relationship to patient: SELF  Call back number: [phone]   Date of last office visit: 1.31.25  Date of next office visit: 5.5.25  Medication(s) to be refilled:  pregabalin (LYRICA) 75 MG capsule   Preferred Pharmacy:  [facility]  Phone: [phone]  Fax: [phone]

## 2023-12-11 NOTE — Telephone Encounter (Signed)
 Pt has dropped off a form for her life ins policy and it has been placed PCP's box.   Please call pt when it is finished and mail it to her home address:  [address] [place] [address]

## 2023-12-11 NOTE — Telephone Encounter (Signed)
 Medication Refill

## 2023-12-11 NOTE — Telephone Encounter (Signed)
 Placed In office box

## 2023-12-12 NOTE — Telephone Encounter (Signed)
 Pt states that she was on social security disability. Pt is unsure she stated that the previous doctor listed a lot her medical problems and restriction that she can not do also her diagnoses as well.

## 2023-12-12 NOTE — Telephone Encounter (Signed)
 We will need more information about this form and what she needs on this please.

## 2023-12-12 NOTE — Telephone Encounter (Signed)
 Called patient and LVM.

## 2023-12-16 MED ORDER — FLUTICASONE-SALMETEROL 230-21 MCG/ACT IN AERO
INHALATION_SPRAY | RESPIRATORY_TRACT | 3 refills | Status: DC
Start: 2023-12-16 — End: 2023-12-24

## 2023-12-16 NOTE — Telephone Encounter (Signed)
 ATC pt x1. Ldvm. Med refill sent to pharmacy on file.

## 2023-12-17 NOTE — Telephone Encounter (Signed)
 Filled out

## 2023-12-24 ENCOUNTER — Other Ambulatory Visit: Payer: Self-pay | Admitting: Pulmonary Disease

## 2023-12-24 DIAGNOSIS — J4540 Moderate persistent asthma, uncomplicated: Secondary | ICD-10-CM

## 2023-12-24 MED ORDER — FLUTICASONE-SALMETEROL 230-21 MCG/ACT IN AERO
INHALATION_SPRAY | RESPIRATORY_TRACT | 0 refills | Status: DC
Start: 2023-12-24 — End: 2024-01-28

## 2023-12-24 NOTE — Telephone Encounter (Signed)
 Copied from CRM [id number]. Topic: Clinical - Prescription Issue >> Dec 24, 2023  2:18 PM Nila Nephew wrote: Reason for CRM: Patient calling to state that she needs fluticasone-salmeterol (ADVAIR HFA) 230-21 MCG/ACT inhaler sent to the [facility] instead, as the pharmacy is going to make her pay for it and it will be expensive.

## 2023-12-24 NOTE — Telephone Encounter (Signed)
 This has been copied and mailed out to the patient

## 2024-01-06 ENCOUNTER — Telehealth: Payer: Self-pay | Admitting: Neurology

## 2024-01-06 NOTE — Telephone Encounter (Signed)
 Patient said she is still having mouth chattering, Severe pain in muscles surrounding her neck and that she has to pick her head up to get out of bed. Patient said that she had been referred to a surgeon to to about her neck and was told that Botox  should fix it. Patient not pleased with that answer but when asked she wants to continue

## 2024-01-06 NOTE — Telephone Encounter (Signed)
Called and left patient a message.

## 2024-01-06 NOTE — Telephone Encounter (Signed)
 Call the patient and find out how she did with the increased Botox  dose and change in pattern of injections.  If she did not do well does she want to continue this?

## 2024-01-06 NOTE — Telephone Encounter (Signed)
 Pt is returning a call to chelsea. She is waiting on a call back

## 2024-01-07 ENCOUNTER — Telehealth: Payer: Self-pay | Admitting: Neurology

## 2024-01-07 NOTE — Telephone Encounter (Signed)
 Called patient back and discussed Dr. Winferd Hatter wants to wait 6 months and see if this clears up. Patient has an appointment in June for injection.  Patient asking if she should keep appointment as a follow up appointment to see how she is doing after not receiving her injection on that day or should she cancel and reschedule an appointment for October for injection with out a follow up in between

## 2024-01-07 NOTE — Telephone Encounter (Signed)
Pt. Returning call.

## 2024-01-07 NOTE — Telephone Encounter (Signed)
 Patient called in, she wants to speak with chelsea about an appt with Dr.Tat. she said chelsea changed the appt and now she is confused

## 2024-01-07 NOTE — Telephone Encounter (Signed)
 Scheduled

## 2024-01-20 ENCOUNTER — Encounter: Payer: Self-pay | Admitting: Internal Medicine

## 2024-01-20 ENCOUNTER — Ambulatory Visit: Payer: Medicare Other | Admitting: Internal Medicine

## 2024-01-20 VITALS — BP 120/80 | HR 67 | Temp 98.7°F | Ht 64.0 in | Wt 156.0 lb

## 2024-01-20 DIAGNOSIS — E049 Nontoxic goiter, unspecified: Secondary | ICD-10-CM

## 2024-01-20 DIAGNOSIS — E063 Autoimmune thyroiditis: Secondary | ICD-10-CM

## 2024-01-20 DIAGNOSIS — R197 Diarrhea, unspecified: Secondary | ICD-10-CM

## 2024-01-20 DIAGNOSIS — R42 Dizziness and giddiness: Secondary | ICD-10-CM | POA: Diagnosis not present

## 2024-01-20 LAB — T4, FREE: Free T4: 1.1 ng/dL (ref 0.60–1.60)

## 2024-01-20 LAB — TSH: TSH: 1.14 u[IU]/mL (ref 0.35–5.50)

## 2024-01-20 MED ORDER — FLUCONAZOLE 150 MG PO TABS
150.0000 mg | ORAL_TABLET | ORAL | 0 refills | Status: DC
Start: 2024-01-20 — End: 2024-02-03

## 2024-01-20 MED ORDER — CHOLESTYRAMINE 4 G PO PACK: 4.0000 g | PACK | Freq: Every day | ORAL | 3 refills | Status: AC

## 2024-01-20 NOTE — Progress Notes (Signed)
   Subjective:   Patient ID: Mariah Snyder, female    DOB: 1945-09-30, 78 y.o.   MRN: [id number]  HPI The patient is a 78 YO female coming in for swelling right sided above collarbone. Also some dizziness first thing in the morning.   Review of Systems  Constitutional: Negative.   HENT: Negative.    Eyes: Negative.   Respiratory:  Negative for cough, chest tightness and shortness of breath.   Cardiovascular:  Negative for chest pain, palpitations and leg swelling.  Gastrointestinal:  Negative for abdominal distention, abdominal pain, constipation, diarrhea, nausea and vomiting.  Musculoskeletal:  Positive for myalgias.       Neck swelling  Skin: Negative.   Neurological: Negative.   Psychiatric/Behavioral: Negative.      Objective:  Physical Exam Constitutional:      Appearance: She is well-developed.  HENT:     Head: Normocephalic and atraumatic.  Neck:     Comments: Thyroid  prominence no discrete mass/lesion Cardiovascular:     Rate and Rhythm: Normal rate and regular rhythm.  Pulmonary:     Effort: Pulmonary effort is normal. No respiratory distress.     Breath sounds: Normal breath sounds. No wheezing or rales.  Abdominal:     General: Bowel sounds are normal. There is no distension.     Palpations: Abdomen is soft.     Tenderness: There is no abdominal tenderness. There is no rebound.  Musculoskeletal:        General: Tenderness present.     Cervical back: Normal range of motion.  Skin:    General: Skin is warm and dry.  Neurological:     Mental Status: She is alert and oriented to person, place, and time.     Coordination: Coordination normal.     Vitals:   01/20/24 0954  BP: 120/80  Pulse: 67  Temp: 98.7 F (37.1 C)  TempSrc: Oral  SpO2: 95%  Weight: 156 lb (70.8 kg)  Height: 5\' 4"  (1.626 m)    Assessment & Plan:

## 2024-01-20 NOTE — Patient Instructions (Addendum)
 We will check the thyroid  levels today and the ultrasound to check.  We have sent in the diflucan 

## 2024-01-21 ENCOUNTER — Ambulatory Visit
Admission: RE | Admit: 2024-01-21 | Discharge: 2024-01-21 | Disposition: A | Discharge status: Home / Self Care | Source: Ambulatory Visit | Attending: Internal Medicine | Admitting: Internal Medicine

## 2024-01-21 ENCOUNTER — Other Ambulatory Visit: Payer: Self-pay | Admitting: Internal Medicine

## 2024-01-21 DIAGNOSIS — E063 Autoimmune thyroiditis: Secondary | ICD-10-CM

## 2024-01-21 DIAGNOSIS — E049 Nontoxic goiter, unspecified: Secondary | ICD-10-CM

## 2024-01-22 ENCOUNTER — Other Ambulatory Visit: Payer: Self-pay

## 2024-01-22 ENCOUNTER — Encounter: Payer: Self-pay | Admitting: Internal Medicine

## 2024-01-22 MED ORDER — LEVOTHYROXINE SODIUM 112 MCG PO TABS: 112.0000 ug | ORAL_TABLET | Freq: Every day | ORAL | 2 refills | Status: DC

## 2024-01-22 NOTE — Telephone Encounter (Signed)
 Done

## 2024-01-24 ENCOUNTER — Encounter: Payer: Self-pay | Admitting: Internal Medicine

## 2024-01-24 DIAGNOSIS — R197 Diarrhea, unspecified: Secondary | ICD-10-CM | POA: Insufficient documentation

## 2024-01-24 NOTE — Assessment & Plan Note (Signed)
 Checking TSH and free T4 and US  thyroid . There is new thyroid  prominence today on exam clinically. Adjust synthroid  112 mcg daily as needed.

## 2024-01-24 NOTE — Assessment & Plan Note (Signed)
 Refill cholestyramine  which she is using several times per week successfully.

## 2024-01-24 NOTE — Assessment & Plan Note (Signed)
 Suspect vertigo symptoms with first thing in the morning symptoms with moving. She is able to wait until this clears and it does not bother her later in the day.

## 2024-01-27 ENCOUNTER — Encounter: Payer: Self-pay | Admitting: Internal Medicine

## 2024-01-28 ENCOUNTER — Encounter: Payer: Self-pay | Admitting: Pulmonary Disease

## 2024-01-28 ENCOUNTER — Ambulatory Visit (INDEPENDENT_AMBULATORY_CARE_PROVIDER_SITE_OTHER): Admitting: Pulmonary Disease

## 2024-01-28 VITALS — BP 126/82 | HR 80 | Ht 63.0 in | Wt 155.0 lb

## 2024-01-28 DIAGNOSIS — K219 Gastro-esophageal reflux disease without esophagitis: Secondary | ICD-10-CM

## 2024-01-28 DIAGNOSIS — Z87891 Personal history of nicotine dependence: Secondary | ICD-10-CM

## 2024-01-28 DIAGNOSIS — J45909 Unspecified asthma, uncomplicated: Secondary | ICD-10-CM | POA: Diagnosis not present

## 2024-01-28 DIAGNOSIS — J4540 Moderate persistent asthma, uncomplicated: Secondary | ICD-10-CM

## 2024-01-28 MED ORDER — FLUTICASONE-SALMETEROL 115-21 MCG/ACT IN AERO: 2.0000 | INHALATION_SPRAY | Freq: Two times a day (BID) | RESPIRATORY_TRACT | 12 refills | Status: DC

## 2024-01-28 NOTE — Progress Notes (Signed)
 Synopsis: Referred in January 2022 for shortness of breath  Subjective:   PATIENT ID: Mariah Snyder GENDER: female DOB: November 02, 1945, MRN: [id number]  HPI  Chief Complaint  Patient presents with   Follow-up    Pt states doing well needs refills    Mariah Snyder is a 78 year old woman, former smoker with asthma and GERD who returns to pulmonary clinic for asthma follow up.   She has been doing well since last visit. She has been on advair  HFA 230-83mcg 2 puffs twice daily. She has been taking montelukast  10mg  daily as needed on days she goes [place] a [place].   OV 07/25/22 She is using advair  2 puffs twice daily and as needed albuterol  nebulizer treatments before she sings with the [facility] which has helped. Overall she has been doing well. She complains of fall allergies bothering her sinus, throat and breathing a bit.  She has received her covid and flu vaccines already. She is interested in the RSV vaccine.  01/03/22 She had c-scope on 4/4 and noted to have aspiration event during the procedure. She called our clinic 4/6 reporting productive cough with blood tinged sputum. She was treated with augmentin  for 7 days. Chest radiograph showed left lower lobe infiltrates.  She continues to have cough with yellowish sputum and no reports of blood tinged sputum.   Her breathing prior to the procedure was getting better as she was walking 2 miles per day multiple times per week.   She is using advair  2 puffs twice daily.   OV 07/19/21 She reports her shortness of breath is better since last visit with increasing her advair  inhaler to 230-21mcg 2 puffs twice daily at last visit. She continues to have some dyspnea and wheezing with exertion on walking uphill or on an incline.   She denies any changes with her breathing due to the cold weather.  OV 03/13/21 She has had an increase in shortness of breath since last visit with activity.  She stopped using Fluticasone  nasal spray  as this gave her nausea which led to vomiting.  She is now using simple saline nasal spray.  She denies any further sinus drainage issues.  OV 10/2020: She had a dentist appointment who reported she had concerning findings for thrush with white patches and redness at the right posterior area of her throat. There was concern that this was related to the spiriva  inhaler she was recently started on. She had an appointment with Josephus Nida, NP on 11/01/20 for evaluation of potential medication reaction.   Prior to her dentist raising concern for thrush, she had no issues of sore throat or oral irritation. She has been on advair  for many years and consistently rinses her mouth out and has not previously had issues with thrush.   The spiriva  inhaler was stopped and she was treated with 2-3 days of nystatin .   She has continued on the advair  and denies any issues with her breathing currently. She is not experiencing the wheezing she previously complained of.   She complains of cough and post-nasal sinus drainage. The cough is worse in the mornings with a whitish sputum.   OV 10/10/20 She reports having progressive shortness of breath and wheezing since September. She notices these symptoms when she walks. She reports being diagnosed with asthma in 2014 when she initially developed the shortness of breath and wheezing. She has been on advair  HFA since then with adquate control of her symptoms.  She tested positive for covid on 10/03/20 which led to fatigue and worsening of her shortness of breath. She also reports sinus congestion and post nasal drip since covid.   Her asthma triggers include seasonal allergies worse in the fall and spring. Strong perfumes, colonges and cigarette smoke also make her breathing worse. She has not required prednisone or antibiotics in the past year.   She has positional sleep apnea based on previous sleep testing and trys to avoid sleeping on her back.   She was exposed to  second hand smoke growing up as her father smoked in the home. She also smoked socially in the past having cigarettes when drinking.   Her mother and youngest brother have asthma.   Past Medical History:  Diagnosis Date   Anemia    Anxiety    Arthritis    Asthma    Fibromyalgia    Gallstones    GERD (gastroesophageal reflux disease)    Hyperlipidemia    Hypertension    Hypothyroidism    Kidney stones    Peptic ulcer    Rectal prolapse    Sleep apnea    Urinary tract infection      Family History  Problem Relation Age of Onset   Asthma Mother    Tremor Mother    Aneurysm Father        AAA   Heart attack Brother        Died from Heart Attack at age 9   Heart disease Brother    Other Brother        Post Polio Syndrome   Heart disease Paternal Grandmother    Tremor Child    Colon cancer Neg Hx    Esophageal cancer Neg Hx    Pancreatic cancer Neg Hx    Colon polyps Neg Hx      Social History   Socioeconomic History   Marital status: Widowed    Spouse name: Not on file   Number of children: 2   Years of education: Not on file   Highest education level: Associate degree: academic program  Occupational History   Occupation: retired    Comment: worked in radiology  Tobacco Use   Smoking status: Former    Types: Cigarettes    Passive exposure: Past   Smokeless tobacco: Never  Vaping Use   Vaping status: Never Used  Substance and Sexual Activity   Alcohol use: Yes    Comment: Has a beer or glass of wine occasionally   Drug use: Never   Sexual activity: Not Currently  Other Topics Concern   Not on file  Social History Narrative   Right Handed    Lives in a two story home   Social Drivers of Health   Financial Resource Strain: Medium Risk (10/17/2023)   Overall Financial Resource Strain (CARDIA)    Difficulty of Paying Living Expenses: Somewhat hard  Food Insecurity: No Food Insecurity (10/17/2023)   Hunger Vital Sign    Worried About Running Out of  Food in the Last Year: Never true    Ran Out of Food in the Last Year: Never true  Transportation Needs: No Transportation Needs (10/17/2023)   PRAPARE - Administrator, Civil Service (Medical): No    Lack of Transportation (Non-Medical): No  Physical Activity: Insufficiently Active (10/17/2023)   Exercise Vital Sign    Days of Exercise per Week: 2 days    Minutes of Exercise per Session: 30 min  Stress: No  Stress Concern Present (10/17/2023)   [employer] of Occupational Health - Occupational Stress Questionnaire    Feeling of Stress : Not at all  Social Connections: Moderately Integrated (10/17/2023)   Social Connection and Isolation Panel [NHANES]    Frequency of Communication with Friends and Family: More than three times a week    Frequency of Social Gatherings with Friends and Family: More than three times a week    Attends Religious Services: More than 4 times per year    Active Member of [employer] or Organizations: Yes    Attends Banker Meetings: More than 4 times per year    Marital Status: Widowed  Intimate Partner Violence: Not At Risk (02/28/2023)   Humiliation, Afraid, Rape, and Kick questionnaire    Fear of Current or Ex-Partner: No    Emotionally Abused: No    Physically Abused: No    Sexually Abused: No     Allergies  Allergen Reactions   Doxycycline      N/V, diarrhea   Sulfa Antibiotics Other (See Comments)    Causes headaches.     Grass Extracts [Gramineae Pollens] Itching and Other (See Comments)    Runny itchy eyes & nose.   Molds & Smuts Itching and Other (See Comments)    Runny itchy eyes & nose.     Outpatient Medications Prior to Visit  Medication Sig Dispense Refill   acetaminophen (TYLENOL) 325 MG tablet Take 650 mg by mouth every 6 (six) hours as needed.     albuterol  (PROVENTIL ) (2.5 MG/3ML) 0.083% nebulizer solution Take 3 mLs (2.5 mg total) by nebulization every 6 (six) hours as needed for wheezing or shortness of  breath. 150 mL 6   AMBULATORY NON FORMULARY MEDICATION Medication Name: Align Probiotic 180 tablet 0   amLODipine  (NORVASC ) 5 MG tablet Take 1 tablet (5 mg total) by mouth daily. 90 tablet 3   ASPIRIN 81 PO Take by mouth.     busPIRone  (BUSPAR ) 5 MG tablet Take 1 tablet (5 mg total) by mouth 2 (two) times daily. Take one tablet in the morning and 1 tablet at bedtime 180 tablet 0   Carboxymethylcell-Hypromellose (GENTEAL) 0.25-0.3 % GEL Apply to eye. Apply to each eye at bedtime     carboxymethylcellul-glycerin (REFRESH OPTIVE) 0.5-0.9 % ophthalmic solution Apply to eye.     Cholecalciferol (VITAMIN D3) 50 MCG (2000 UT) capsule Take 1 capsule (2,000 Units total) by mouth daily. Take one capsule daily 90 capsule 3   cholestyramine  (QUESTRAN ) 4 g packet Take 1 packet (4 g total) by mouth daily. 90 each 3   clobetasol  ointment (TEMOVATE ) 0.05 % Apply to affected area twice a week as directed 90 g 11   conjugated estrogens  (PREMARIN ) vaginal cream Place 1 Applicatorful vaginally daily for 2 weeks, then reduce to 1  applicatorful 2x weekly thereafter. (ok to send out with these  directions, if this is not correct please cancel the script and send in a  new erx with a note, thanks) 30 g 4   fluconazole  (DIFLUCAN ) 150 MG tablet Take 1 tablet (150 mg total) by mouth every 3 (three) days. 5 tablet 0   levothyroxine  (SYNTHROID ) 112 MCG tablet Take 1 tablet (112 mcg total) by mouth daily. 90 tablet 2   lisinopril  (ZESTRIL ) 5 MG tablet Take 1 tablet (5 mg total) by mouth daily. 90 tablet 3   mirabegron  ER (MYRBETRIQ ) 50 MG TB24 tablet Take 1 tablet (50 mg total) by mouth daily. 90 tablet 3  montelukast  (SINGULAIR ) 10 MG tablet Take 1 tablet (10 mg total) by mouth at bedtime. 90 tablet 3   omeprazole  (PRILOSEC) 20 MG capsule Take 1 capsule (20 mg total) by mouth daily as needed. 90 capsule 1   pregabalin  (LYRICA ) 75 MG capsule Take 1 capsule (75 mg total) by mouth 2 (two) times daily. 180 capsule 1   primidone   (MYSOLINE ) 50 MG tablet TAKE FOUR TABLETS BY MOUTH TWICE A DAY 720 tablet 0   propranolol  (INDERAL ) 20 MG tablet Take 2 tablets (40 mg total) by mouth at bedtime. 180 tablet 1   simvastatin  (ZOCOR ) 20 MG tablet Take 1 tablet (20 mg total) by mouth daily. 90 tablet 3   fluticasone -salmeterol (ADVAIR  HFA) 230-21 MCG/ACT inhaler INHALE 2 PUFFS BY MOUTH TWICE A DAY AND RINSE MOUTH AFTER USE 3 each 0   Omega-3 Fatty Acids (FISH OIL) 1000 MG CAPS Take 1 capsule by mouth daily. (Patient not taking: Reported on 01/28/2024)     No facility-administered medications prior to visit.   Review of Systems  Constitutional:  Negative for chills, fever, malaise/fatigue and weight loss.  HENT:  Negative for congestion, sinus pain and sore throat.   Eyes: Negative.   Respiratory:  Negative for cough, hemoptysis, sputum production, shortness of breath and wheezing.   Cardiovascular:  Negative for chest pain, palpitations, orthopnea, claudication and leg swelling.  Gastrointestinal:  Negative for abdominal pain, heartburn, nausea and vomiting.  Genitourinary: Negative.   Musculoskeletal:  Negative for joint pain and myalgias.  Skin:  Negative for rash.  Neurological:  Negative for weakness.  Endo/Heme/Allergies: Negative.   Psychiatric/Behavioral: Negative.       Objective:   Vitals:   01/28/24 1048  BP: 126/82  Pulse: 80  SpO2: 95%  Weight: 155 lb (70.3 kg)  Height: 5\' 3"  (1.6 m)    Physical Exam Constitutional:      Appearance: Normal appearance. She is normal weight.  HENT:     Head: Normocephalic and atraumatic.  Eyes:     General: No scleral icterus.    Conjunctiva/sclera: Conjunctivae normal.  Cardiovascular:     Rate and Rhythm: Normal rate and regular rhythm.     Pulses: Normal pulses.     Heart sounds: Normal heart sounds.  Pulmonary:     Effort: Pulmonary effort is normal.     Breath sounds: Normal breath sounds.  Musculoskeletal:     Right lower leg: No edema.     Left lower  leg: No edema.     CBC    Component Value Date/Time   WBC 6.6 07/23/2023 1027   RBC 4.72 07/23/2023 1027   HGB 13.7 07/23/2023 1027   HCT 42.5 07/23/2023 1027   PLT 275.0 07/23/2023 1027   MCV 90.0 07/23/2023 1027   MCH 28.6 01/13/2022 1959   MCHC 32.3 07/23/2023 1027   RDW 14.0 07/23/2023 1027   LYMPHSABS 2.8 01/13/2022 1959   MONOABS 0.8 01/13/2022 1959   EOSABS 0.2 01/13/2022 1959   BASOSABS 0.0 01/13/2022 1959   Chest imaging: CXR 12/21/20 Cardiomediastinal silhouette within normal limits.   No interlobular septal thickening.   Mixed reticulonodular opacities in the lower lungs, worst on the left. No pleural effusion. No pneumothorax. Coarsened interstitial markings, with no comparison.  CXR 10/03/2017 Radiographically clear lungs.  No pleural effusion or pneumothorax.  Cardiomediastinal silhouette unremarkable.   PFT:    Latest Ref Rng & Units 03/13/2021   12:49 PM  PFT Results  FVC-Pre L 1.96   FVC-Predicted  Pre % 74   FVC-Post L 1.96   FVC-Predicted Post % 74   Pre FEV1/FVC % % 82   Post FEV1/FCV % % 83   FEV1-Pre L 1.61   FEV1-Predicted Pre % 81   FEV1-Post L 1.64   DLCO uncorrected ml/min/mmHg 14.41   DLCO UNC% % 78   DLCO corrected ml/min/mmHg 14.41   DLCO COR %Predicted % 78   DLVA Predicted % 101   TLC L 3.84   TLC % Predicted % 78   RV % Predicted % 81   PFT 2022: Mild restrictive defect    Assessment & Plan:   Moderate persistent asthma without complication - Plan: fluticasone -salmeterol (ADVAIR  HFA) 115-21 MCG/ACT inhaler  Discussion: Mariah Snyder is a 78 year old woman, former smoker with asthma and GERD who returns to pulmonary clinic for asthma follow up.  She has been doing well on Advair  230-25 MCG 2 puffs twice daily with no significant need for albuterol . Will reduce her Advair  HFA dose to 115/65mcg 2 puffs twice daily. Continue as needed albuterol  inhaler.  Continue montelukast  10mg  daily for allergies as needed  Follow up in 1  year.  Duaine German, MD [facility] Office: [phone]   Current Outpatient Medications:    acetaminophen (TYLENOL) 325 MG tablet, Take 650 mg by mouth every 6 (six) hours as needed., Disp: , Rfl:    albuterol  (PROVENTIL ) (2.5 MG/3ML) 0.083% nebulizer solution, Take 3 mLs (2.5 mg total) by nebulization every 6 (six) hours as needed for wheezing or shortness of breath., Disp: 150 mL, Rfl: 6   AMBULATORY NON FORMULARY MEDICATION, Medication Name: Align Probiotic, Disp: 180 tablet, Rfl: 0   amLODipine  (NORVASC ) 5 MG tablet, Take 1 tablet (5 mg total) by mouth daily., Disp: 90 tablet, Rfl: 3   ASPIRIN 81 PO, Take by mouth., Disp: , Rfl:    busPIRone  (BUSPAR ) 5 MG tablet, Take 1 tablet (5 mg total) by mouth 2 (two) times daily. Take one tablet in the morning and 1 tablet at bedtime, Disp: 180 tablet, Rfl: 0   Carboxymethylcell-Hypromellose (GENTEAL) 0.25-0.3 % GEL, Apply to eye. Apply to each eye at bedtime, Disp: , Rfl:    carboxymethylcellul-glycerin (REFRESH OPTIVE) 0.5-0.9 % ophthalmic solution, Apply to eye., Disp: , Rfl:    Cholecalciferol (VITAMIN D3) 50 MCG (2000 UT) capsule, Take 1 capsule (2,000 Units total) by mouth daily. Take one capsule daily, Disp: 90 capsule, Rfl: 3   cholestyramine  (QUESTRAN ) 4 g packet, Take 1 packet (4 g total) by mouth daily., Disp: 90 each, Rfl: 3   clobetasol  ointment (TEMOVATE ) 0.05 %, Apply to affected area twice a week as directed, Disp: 90 g, Rfl: 11   conjugated estrogens  (PREMARIN ) vaginal cream, Place 1 Applicatorful vaginally daily for 2 weeks, then reduce to 1  applicatorful 2x weekly thereafter. (ok to send out with these  directions, if this is not correct please cancel the script and send in a  new erx with a note, thanks), Disp: 30 g, Rfl: 4   fluconazole  (DIFLUCAN ) 150 MG tablet, Take 1 tablet (150 mg total) by mouth every 3 (three) days., Disp: 5 tablet, Rfl: 0   fluticasone -salmeterol (ADVAIR  HFA) 115-21 MCG/ACT  inhaler, Inhale 2 puffs into the lungs 2 (two) times daily., Disp: 1 each, Rfl: 12   levothyroxine  (SYNTHROID ) 112 MCG tablet, Take 1 tablet (112 mcg total) by mouth daily., Disp: 90 tablet, Rfl: 2   lisinopril  (ZESTRIL ) 5 MG tablet, Take 1 tablet (5 mg  total) by mouth daily., Disp: 90 tablet, Rfl: 3   mirabegron  ER (MYRBETRIQ ) 50 MG TB24 tablet, Take 1 tablet (50 mg total) by mouth daily., Disp: 90 tablet, Rfl: 3   montelukast  (SINGULAIR ) 10 MG tablet, Take 1 tablet (10 mg total) by mouth at bedtime., Disp: 90 tablet, Rfl: 3   omeprazole  (PRILOSEC) 20 MG capsule, Take 1 capsule (20 mg total) by mouth daily as needed., Disp: 90 capsule, Rfl: 1   pregabalin  (LYRICA ) 75 MG capsule, Take 1 capsule (75 mg total) by mouth 2 (two) times daily., Disp: 180 capsule, Rfl: 1   primidone  (MYSOLINE ) 50 MG tablet, TAKE FOUR TABLETS BY MOUTH TWICE A DAY, Disp: 720 tablet, Rfl: 0   propranolol  (INDERAL ) 20 MG tablet, Take 2 tablets (40 mg total) by mouth at bedtime., Disp: 180 tablet, Rfl: 1   simvastatin  (ZOCOR ) 20 MG tablet, Take 1 tablet (20 mg total) by mouth daily., Disp: 90 tablet, Rfl: 3   Omega-3 Fatty Acids (FISH OIL) 1000 MG CAPS, Take 1 capsule by mouth daily. (Patient not taking: Reported on 01/28/2024), Disp: , Rfl:

## 2024-01-28 NOTE — Patient Instructions (Signed)
 We will reduce your advair  inhaler dose  Use advair  HFA 115-21mcg 2 puffs twice daily - rinse mouth out after each use  Use albuterol  as needed  Continue montelukast  10mg  daily as needed  Follow up in 1 year, call sooner if needed

## 2024-01-31 ENCOUNTER — Other Ambulatory Visit: Payer: Self-pay | Admitting: Internal Medicine

## 2024-01-31 DIAGNOSIS — Z1231 Encounter for screening mammogram for malignant neoplasm of breast: Secondary | ICD-10-CM

## 2024-02-03 ENCOUNTER — Other Ambulatory Visit: Payer: Self-pay

## 2024-02-03 MED ORDER — FLUCONAZOLE 150 MG PO TABS: 150.0000 mg | ORAL_TABLET | ORAL | 0 refills | Status: DC

## 2024-02-03 NOTE — Telephone Encounter (Signed)
 Copied from CRM [id number]. Topic: Clinical - Medication Question >> Jan 30, 2024  5:09 PM Alyse July wrote: Reason for CRM: Patient called to follow up on medication  fluconazole  (DIFLUCAN ) 150 MG tablets. Patient states the pharmacy [facility] has not received document sent on 01/20/24 for medication clarification as their is a possible drug interaction per pharmacy that's in question based off the other medications patient currently takes. Please contact pharmacy and patient to advise [phone].

## 2024-02-05 ENCOUNTER — Telehealth: Payer: Self-pay | Admitting: Internal Medicine

## 2024-02-05 ENCOUNTER — Other Ambulatory Visit: Payer: Self-pay | Admitting: Internal Medicine

## 2024-02-05 ENCOUNTER — Other Ambulatory Visit: Payer: Self-pay

## 2024-02-05 MED ORDER — FLUCONAZOLE 150 MG PO TABS: 150.0000 mg | ORAL_TABLET | ORAL | 0 refills | Status: DC

## 2024-02-05 NOTE — Telephone Encounter (Signed)
 Copied from CRM [id number]. Topic: Clinical - Prescription Issue >> Feb 05, 2024 12:50 PM Mariah Snyder wrote: Reason for CRM: Patient called to confirm if the Dr. Nicolette Snyder  had confirm if she received the note about her medication >> Feb 05, 2024  1:11 PM Mariah Snyder wrote: [name] with [facility] needs prescription cleared.She needs the nurse or doctor to call and confirm that they want her to take diflucan  and simvastatin  (ZOCOR ) 20 MG tablet together since there is an interaction. She said that what's holding patient up from getting her prescription.

## 2024-02-06 NOTE — Telephone Encounter (Signed)
 I called to confirm that yes this can be filled it was ok by the patients provide and I also checked with the pharmacist about this as well about the interaction with the two medication simvastatin  and diflucan . I have also sent patient a message in regards to this.

## 2024-02-11 ENCOUNTER — Encounter: Payer: Self-pay | Admitting: Neurology

## 2024-02-12 ENCOUNTER — Ambulatory Visit
Admission: RE | Admit: 2024-02-12 | Discharge: 2024-02-12 | Disposition: A | Discharge status: Home / Self Care | Source: Ambulatory Visit | Attending: Internal Medicine | Admitting: Internal Medicine

## 2024-02-12 DIAGNOSIS — Z1231 Encounter for screening mammogram for malignant neoplasm of breast: Secondary | ICD-10-CM

## 2024-02-14 ENCOUNTER — Ambulatory Visit: Payer: Self-pay | Admitting: Internal Medicine

## 2024-02-14 ENCOUNTER — Telehealth: Payer: Self-pay | Admitting: Internal Medicine

## 2024-02-14 LAB — HM MAMMOGRAPHY

## 2024-02-14 MED ORDER — LEVOTHYROXINE SODIUM 112 MCG PO TABS: 112.0000 ug | ORAL_TABLET | Freq: Every day | ORAL | 2 refills | Status: AC

## 2024-02-14 NOTE — Telephone Encounter (Signed)
 Copied from CRM [id number]. Topic: Clinical - Medication Refill >> Feb 14, 2024 12:43 PM Chuck Crater wrote: Medication: levothyroxine  (SYNTHROID ) 112 MCG tablet *requesting several refills   Has the patient contacted their pharmacy? No (Agent: If no, request that the patient contact the pharmacy for the refill. If patient does not wish to contact the pharmacy document the reason why and proceed with request.) (Agent: If yes, when and what did the pharmacy advise?) No refills   This is the patient's preferred pharmacy:  [facility] - [place], [place] - [address] [place] [address] Phone: [phone] Fax: [phone]  Is this the correct pharmacy for this prescription? Yes If no, delete pharmacy and type the correct one.   Has the prescription been filled recently? Yes  Is the patient out of the medication? No  Has the patient been seen for an appointment in the last year OR does the patient have an upcoming appointment? Yes  Can we respond through MyChart? Yes  Agent: Please be advised that Rx refills may take up to 3 business days. We ask that you follow-up with your pharmacy.

## 2024-03-10 ENCOUNTER — Ambulatory Visit: Admitting: Podiatry

## 2024-03-12 ENCOUNTER — Ambulatory Visit (INDEPENDENT_AMBULATORY_CARE_PROVIDER_SITE_OTHER): Admitting: Podiatry

## 2024-03-12 DIAGNOSIS — B351 Tinea unguium: Secondary | ICD-10-CM

## 2024-03-12 DIAGNOSIS — M79674 Pain in right toe(s): Secondary | ICD-10-CM

## 2024-03-12 DIAGNOSIS — M79675 Pain in left toe(s): Secondary | ICD-10-CM

## 2024-03-13 ENCOUNTER — Ambulatory Visit: Admitting: Neurology

## 2024-03-14 NOTE — Progress Notes (Signed)
 Subjective: Chief Complaint  Patient presents with   RFC     RM#13 RFC nail trimming.     78 year old female with the above concerns.  She is nails are thickened elongated she not able to trim them herself.  She states the nails are painful particularly she has noticed the right second toenail.  Otherwise she states that she has been doing well.  No other concerns.  Objective: AAO x3, NAD DP/PT pulses palpable bilaterally, CRT less than 3 seconds Nails are hypertrophic, dystrophic, brittle, discolored, elongated 10.  Incurvation of the nails without any signs of infection.  Right second toenail partially more curved putting pressure on both the medial and lateral aspects of the toenail.  There is no signs of infection.  Tenderness nails 1-5 bilaterally.  No open lesions or pre-ulcerative lesions are identified today.   No pain with calf compression, swelling, warmth, erythema  Assessment: Ingrown toenail, symptomatic onychomycosis  Plan: -All treatment options discussed with the patient including all alternatives, risks, complications.  -Sharply debrided nails x10 without any complications or bleeding.  Monitor ingrown toenails for any signs or symptoms of infection.  -Patient encouraged to call the office with any questions, concerns, change in symptoms.   Return in about 9 weeks (around 05/14/2024) for nail trim .  Mariah Snyder DPM

## 2024-03-18 ENCOUNTER — Other Ambulatory Visit: Payer: Self-pay | Admitting: Pulmonary Disease

## 2024-03-18 DIAGNOSIS — J4540 Moderate persistent asthma, uncomplicated: Secondary | ICD-10-CM

## 2024-03-18 MED ORDER — FLUTICASONE-SALMETEROL 115-21 MCG/ACT IN AERO: 2.0000 | INHALATION_SPRAY | Freq: Two times a day (BID) | RESPIRATORY_TRACT | 12 refills | Status: AC

## 2024-03-18 MED ORDER — ALBUTEROL SULFATE (2.5 MG/3ML) 0.083% IN NEBU: 2.5000 mg | INHALATION_SOLUTION | Freq: Four times a day (QID) | RESPIRATORY_TRACT | 6 refills | Status: AC | PRN

## 2024-03-18 MED ORDER — MONTELUKAST SODIUM 10 MG PO TABS: 10.0000 mg | ORAL_TABLET | Freq: Every day | ORAL | 3 refills | Status: AC

## 2024-03-18 NOTE — Telephone Encounter (Signed)
 Copied from CRM [id number]. Topic: Clinical - Medication Refill >> Mar 18, 2024  8:38 AM Corean SAUNDERS wrote: Medication: albuterol  (PROVENTIL ) (2.5 MG/3ML) 0.083% nebulizer solution  montelukast  (SINGULAIR ) 10 MG tablet fluticasone -salmeterol (ADVAIR  HFA) 115-21 MCG/ACT inhaler  Has the patient contacted their pharmacy? No, prescriptions are expired.  (Agent: If no, request that the patient contact the pharmacy for the refill. If patient does not wish to contact the pharmacy document the reason why and proceed with request.) (Agent: If yes, when and what did the pharmacy advise?)  This is the patient's preferred pharmacy:  [facility] - [place], [place] - [address] [place] [address] Phone: [phone] Fax: [phone]   Is this the correct pharmacy for this prescription? Yes If no, delete pharmacy and type the correct one.   Has the prescription been filled recently? Yes  Is the patient out of the medication? No  Has the patient been seen for an appointment in the last year OR does the patient have an upcoming appointment? Yes  Can we respond through MyChart? Yes  Agent: Please be advised that Rx refills may take up to 3 business days. We ask that you follow-up with your pharmacy.

## 2024-03-25 ENCOUNTER — Ambulatory Visit

## 2024-03-25 ENCOUNTER — Telehealth: Payer: Self-pay

## 2024-03-25 VITALS — Ht 63.0 in | Wt 155.0 lb

## 2024-03-25 DIAGNOSIS — Z0000 Encounter for general adult medical examination without abnormal findings: Secondary | ICD-10-CM | POA: Diagnosis not present

## 2024-03-25 NOTE — Telephone Encounter (Signed)
 Patient has complaints about her chronic diarrhea today.  She stated that she would like to get to the bottom of why she is having incontinence more often and why is it diarrhea form.  Patient stated that the medication that Dr. Rollene prescribed helps some but very little (cholestyramine  (QUESTRAN ).  Patient is wanting to know if she could be seen by a specialist for the diarrhea happening too often.  I informed patient that I would inform Dr. Rollene and see what she thinks if she had any other suggestions about patient seeing a specialist.

## 2024-03-25 NOTE — Patient Instructions (Signed)
 Mariah Snyder , Thank you for taking time out of your busy schedule to complete your Annual Wellness Visit with me. I enjoyed our conversation and look forward to speaking with you again next year. I, as well as your care team,  appreciate your ongoing commitment to your health goals. Please review the following plan we discussed and let me know if I can assist you in the future. Your Game plan/ To Do List    Follow up Visits: Next Medicare AWV with our clinical staff: 03/26/2025.   Have you seen your provider in the last 6 months (3 months if uncontrolled diabetes)? Yes Next Office Visit with your provider: Patient's last office visit was on 01/20/2024.  Clinician Recommendations:  Aim for 30 minutes of exercise or brisk walking, 6-8 glasses of water each day.       This is a list of the screening recommended for you and due dates:  Health Maintenance  Topic Date Due   Hepatitis C Screening  Never done   COVID-19 Vaccine (7 - 2024-25 season) 05/19/2023   Flu Shot  04/17/2024   Medicare Annual Wellness Visit  03/25/2025   DTaP/Tdap/Td vaccine (2 - Td or Tdap) 06/04/2028   Pneumococcal Vaccine for age over 71  Completed   DEXA scan (bone density measurement)  Completed   Zoster (Shingles) Vaccine  Completed   Hepatitis B Vaccine  Aged Out   HPV Vaccine  Aged Out   Meningitis B Vaccine  Aged Out   Colon Cancer Screening  Discontinued    Advanced directives: (Copy Requested) Please bring a copy of your health care power of attorney and living will to the office to be added to your chart at your convenience. You can mail to [facility] [address] [place], [place] [address] or email to [email or web address] Advance Care Planning is important because it:  [x]  Makes sure you receive the medical care that is consistent with your values, goals, and preferences  [x]  It provides guidance to your family and loved ones and reduces their decisional burden about whether or not  they are making the right decisions based on your wishes.  Follow the link provided in your after visit summary or read over the paperwork we have mailed to you to help you started getting your Advance Directives in place. If you need assistance in completing these, please reach out to us  so that we can help you!  See attachments for Preventive Care and Fall Prevention Tips.

## 2024-03-25 NOTE — Progress Notes (Signed)
 Subjective:   Mariah Snyder is a 78 y.o. who presents for a Medicare Wellness preventive visit.  As a reminder, Annual Wellness Visits don't include a physical exam, and some assessments may be limited, especially if this visit is performed virtually. We may recommend an in-person follow-up visit with your provider if needed.  Visit Complete: Virtual I connected with  Mariah Snyder on 03/25/24 by a audio enabled telemedicine application and verified that I am speaking with the correct person using two identifiers.  Patient Location: Home  Provider Location: Home Office  I discussed the limitations of evaluation and management by telemedicine. The patient expressed understanding and agreed to proceed.  Vital Signs: Because this visit was a virtual/telehealth visit, some criteria may be missing or patient reported. Any vitals not documented were not able to be obtained and vitals that have been documented are patient reported.  VideoDeclined- This patient declined Librarian, academic. Therefore the visit was completed with audio only.  Persons Participating in Visit: Patient.  AWV Questionnaire: Yes: Patient Medicare AWV questionnaire was completed by the patient on 03/21/2024; I have confirmed that all information answered by patient is correct and no changes since this date.  Cardiac Risk Factors include: advanced age (>82men, >58 women);hypertension     Objective:    Today's Vitals   03/21/24 1531 03/25/24 1120  Weight:  155 lb (70.3 kg)  Height:  5' 3 (1.6 m)  PainSc: 5     Body mass index is 27.46 kg/m.     03/25/2024   11:35 AM 07/31/2023   10:11 AM 07/05/2023    2:24 PM 03/15/2023    8:10 AM 02/28/2023    3:18 PM 11/13/2022    9:11 AM 10/31/2022   10:31 AM  Advanced Directives  Does Patient Have a Medical Advance Directive? Yes Yes Yes Yes Yes Yes Yes  Type of Estate agent of [place];Living will Healthcare Power of  [place];Living will Living will Living will Healthcare Power of [place];Living will Healthcare Power of [place];Out of facility DNR (pink MOST or yellow form);Living will Healthcare Power of [place];Living will  Does patient want to make changes to medical advance directive?  No - Patient declined     No - Patient declined  Copy of Healthcare Power of Attorney in Chart? No - copy requested No - copy requested   No - copy requested      Current Medications (verified) Outpatient Encounter Medications as of 03/25/2024  Medication Sig   acetaminophen (TYLENOL) 325 MG tablet Take 650 mg by mouth every 6 (six) hours as needed.   albuterol  (PROVENTIL ) (2.5 MG/3ML) 0.083% nebulizer solution Take 3 mLs (2.5 mg total) by nebulization every 6 (six) hours as needed for wheezing or shortness of breath.   amLODipine  (NORVASC ) 5 MG tablet Take 1 tablet (5 mg total) by mouth daily.   ASPIRIN 81 PO Take by mouth.   Bepotastine Besilate 1.5 % SOLN Apply 1 drop to eye.   busPIRone  (BUSPAR ) 5 MG tablet Take 1 tablet (5 mg total) by mouth 2 (two) times daily. Take one tablet in the morning and 1 tablet at bedtime   Carboxymethylcell-Hypromellose (GENTEAL) 0.25-0.3 % GEL Apply to eye. Apply to each eye at bedtime   carboxymethylcellul-glycerin (REFRESH OPTIVE) 0.5-0.9 % ophthalmic solution Apply to eye.   Cholecalciferol (VITAMIN D3) 50 MCG (2000 UT) capsule Take 1 capsule (2,000 Units total) by mouth daily. Take one capsule daily   cholestyramine  (QUESTRAN ) 4 g  packet Take 1 packet (4 g total) by mouth daily.   clobetasol  ointment (TEMOVATE ) 0.05 % Apply to affected area twice a week as directed   conjugated estrogens  (PREMARIN ) vaginal cream Place 1 Applicatorful vaginally daily for 2 weeks, then reduce to 1  applicatorful 2x weekly thereafter. (ok to send out with these  directions, if this is not correct please cancel the script and send in a  new erx with a note, thanks)   fluconazole  (DIFLUCAN ) 150 MG tablet  Take 1 tablet (150 mg total) by mouth every 3 (three) days. (fill  fluconazole  as written and continue simvastatin , ok for patient to take  both)   fluticasone -salmeterol (ADVAIR  HFA) 115-21 MCG/ACT inhaler Inhale 2 puffs into the lungs 2 (two) times daily.   levothyroxine  (SYNTHROID ) 112 MCG tablet Take 1 tablet (112 mcg total) by mouth daily.   lisinopril  (ZESTRIL ) 5 MG tablet Take 1 tablet (5 mg total) by mouth daily.   mirabegron  ER (MYRBETRIQ ) 50 MG TB24 tablet Take 1 tablet (50 mg total) by mouth daily.   montelukast  (SINGULAIR ) 10 MG tablet Take 1 tablet (10 mg total) by mouth at bedtime.   omeprazole  (PRILOSEC) 20 MG capsule Take 1 capsule (20 mg total) by mouth daily as needed.   pregabalin  (LYRICA ) 75 MG capsule Take 1 capsule (75 mg total) by mouth 2 (two) times daily.   primidone  (MYSOLINE ) 50 MG tablet TAKE FOUR TABLETS BY MOUTH TWICE A DAY   propranolol  (INDERAL ) 20 MG tablet Take 2 tablets (40 mg total) by mouth at bedtime.   simvastatin  (ZOCOR ) 20 MG tablet Take 1 tablet (20 mg total) by mouth daily.   AMBULATORY NON FORMULARY MEDICATION Medication Name: Align Probiotic   Omega-3 Fatty Acids (FISH OIL) 1000 MG CAPS Take 1 capsule by mouth daily. (Patient not taking: Reported on 01/28/2024)   No facility-administered encounter medications on file as of 03/25/2024.    Allergies (verified) Doxycycline , Sulfa antibiotics, Grass extracts [gramineae pollens], and Molds & smuts   History: Past Medical History:  Diagnosis Date   Anemia    Anxiety    Arthritis    Asthma    Fibromyalgia    Gallstones    GERD (gastroesophageal reflux disease)    Hyperlipidemia    Hypertension    Hypothyroidism    Kidney stones    Peptic ulcer    Rectal prolapse    Sleep apnea    Urinary tract infection    Past Surgical History:  Procedure Laterality Date   bunyionectomy Left    CHOLECYSTECTOMY     ELBOW ARTHROSCOPY Left    LAPAROSCOPIC ASSISTED VAGINAL HYSTERECTOMY     SHOULDER  ARTHROSCOPY Right    TONSILLECTOMY     Family History  Problem Relation Age of Onset   Asthma Mother    Tremor Mother    Aneurysm Father        AAA   Heart attack Brother        Died from Heart Attack at age 81   Heart disease Brother    Other Brother        Post Polio Syndrome   Heart disease Paternal Grandmother    Tremor Child    Colon cancer Neg Hx    Esophageal cancer Neg Hx    Pancreatic cancer Neg Hx    Colon polyps Neg Hx    Social History   Socioeconomic History   Marital status: Widowed    Spouse name: Not on file   Number  of children: 2   Years of education: Not on file   Highest education level: Associate degree: occupational, Scientist, product/process development, or vocational program  Occupational History   Occupation: retired    Comment: worked in radiology  Tobacco Use   Smoking status: Former    Types: Cigarettes    Passive exposure: Past   Smokeless tobacco: Never  Vaping Use   Vaping status: Never Used  Substance and Sexual Activity   Alcohol use: Yes    Comment: Has a beer or glass of wine occasionally   Drug use: Never   Sexual activity: Not Currently  Other Topics Concern   Not on file  Social History Narrative   Right Handed    Lives in a two story home   Social Drivers of Health   Financial Resource Strain: Low Risk  (03/21/2024)   Overall Financial Resource Strain (CARDIA)    Difficulty of Paying Living Expenses: Not hard at all  Food Insecurity: No Food Insecurity (03/21/2024)   Hunger Vital Sign    Worried About Running Out of Food in the Last Year: Never true    Ran Out of Food in the Last Year: Never true  Transportation Needs: No Transportation Needs (03/21/2024)   PRAPARE - Administrator, Civil Service (Medical): No    Lack of Transportation (Non-Medical): No  Physical Activity: Insufficiently Active (03/21/2024)   Exercise Vital Sign    Days of Exercise per Week: 1 day    Minutes of Exercise per Session: 20 min  Stress: No Stress Concern  Present (03/21/2024)   [employer] of Occupational Health - Occupational Stress Questionnaire    Feeling of Stress: Not at all  Social Connections: Moderately Isolated (03/21/2024)   Social Connection and Isolation Panel    Frequency of Communication with Friends and Family: More than three times a week    Frequency of Social Gatherings with Friends and Family: More than three times a week    Attends Religious Services: Patient declined    Database administrator or Organizations: Yes    Attends Banker Meetings: More than 4 times per year    Marital Status: Widowed    Tobacco Counseling Counseling given: Not Answered    Clinical Intake:  Pre-visit preparation completed: Yes  Pain : 0-10 Pain Score: 5  Pain Type: Chronic pain Pain Location: Neck (lower back) Pain Orientation: Left Pain Radiating Towards: down to lt hip and lt leg Pain Descriptors / Indicators: Aching, Discomfort Pain Onset: More than a month ago Pain Frequency: Constant Pain Relieving Factors: Lyica, Tylenol Effect of Pain on Daily Activities: moving  Pain Relieving Factors: Lyica, Tylenol  BMI - recorded: 27.46 Nutritional Status: BMI 25 -29 Overweight Nutritional Risks: Nausea/ vomitting/ diarrhea (diarrhea) Diabetes: No  No results found for: HGBA1C   How often do you need to have someone help you when you read instructions, pamphlets, or other written materials from your doctor or pharmacy?: 1 - Never  Interpreter Needed?: No  Information entered by :: Phillippe Orlick, RMA   Activities of Daily Living     03/21/2024    3:31 PM  In your present state of health, do you have any difficulty performing the following activities:  Hearing? 0  Vision? 0  Difficulty concentrating or making decisions? 0  Walking or climbing stairs? 0  Doing errands, shopping? 0  Preparing Food and eating ? N  Using the Toilet? N  In the past six months, have you accidently  leaked urine? Y  Do you  have problems with loss of bowel control? Y  Managing your Medications? N  Managing your Finances? N  Housekeeping or managing your Housekeeping? N    Patient Care Team: Rollene Almarie LABOR, MD as PCP - General (Internal Medicine) Tat, Asberry RAMAN, DO as Consulting Physician (Neurology) Tobin Venus Gelineau, MD (Ophthalmology) Melba Pool, DDS as Referring Physician (Dentistry) Kara Dorn NOVAK, MD as Consulting Physician (Pulmonary Disease)  I have updated your Care Teams any recent Medical Services you may have received from other providers in the past year.     Assessment:   This is a routine wellness examination for Mariah Snyder.  Hearing/Vision screen Hearing Screening - Comments:: Denies hearing difficulties   Vision Screening - Comments:: Wears eyeglasses/Dr [facility]) [place], [place]   Goals Addressed   None    Depression Screen     03/25/2024   11:36 AM 02/28/2023    3:11 PM 09/14/2022    3:18 PM 09/14/2022    3:17 PM 02/14/2022    2:15 PM 04/28/2021   10:06 AM  PHQ 2/9 Scores  PHQ - 2 Score 1 0 0 0 0 0  PHQ- 9 Score 1  0       Fall Risk     03/21/2024    3:31 PM 01/20/2024   10:00 AM 07/23/2023    9:44 AM 07/05/2023    2:24 PM 03/15/2023    8:09 AM  Fall Risk   Falls in the past year? 1 1 1  0 1  Number falls in past yr: 1 0 0 0 1  Injury with Fall? 1 1 1  0 1  Risk for fall due to : Impaired balance/gait      Follow up Falls prevention discussed;Falls evaluation completed Falls evaluation completed Falls evaluation completed Falls evaluation completed Falls evaluation completed    MEDICARE RISK AT HOME:  Medicare Risk at Home Any stairs in or around the home?: (Patient-Rptd) Yes If so, are there any without handrails?: (Patient-Rptd) Yes Home free of loose throw rugs in walkways, pet beds, electrical cords, etc?: (Patient-Rptd) Yes Adequate lighting in your home to reduce risk of falls?: (Patient-Rptd) Yes Life alert?: (Patient-Rptd) No Use of a cane,  walker or w/c?: (Patient-Rptd) No Grab bars in the bathroom?: (Patient-Rptd) No Shower chair or bench in shower?: (Patient-Rptd) No Elevated toilet seat or a handicapped toilet?: (Patient-Rptd) Yes  TIMED UP AND GO:  Was the test performed?  No  Cognitive Function: Declined/Normal: No cognitive concerns noted by patient or family. Patient alert, oriented, able to answer questions appropriately and recall recent events. No signs of memory loss or confusion.        02/28/2023    3:18 PM 02/14/2022    2:20 PM  6CIT Screen  What Year? 0 points 0 points  What month? 0 points 0 points  What time? 0 points 0 points  Count back from 20 0 points 0 points  Months in reverse 0 points 0 points  Repeat phrase 0 points 0 points  Total Score 0 points 0 points    Immunizations Immunization History  Administered Date(s) Administered   Fluad Quad(high Dose 65+) 06/23/2021   Influenza, High Dose Seasonal PF 06/08/2022   Influenza, Seasonal, Injecte, Preservative Fre 07/10/2006, 06/06/2009, 08/21/2010, 07/02/2011   Influenza,inj,Quad PF,6+ Mos 07/02/2016, 05/26/2018, 06/09/2019   Influenza-Unspecified 06/25/2014, 07/02/2016, 07/04/2017, 07/10/2021   Moderna Covid-19 Vaccine Bivalent Booster 22yrs & up 07/10/2021   Moderna SARS-COV2 Booster Vaccination  01/27/2021   Moderna Sars-Covid-2 Vaccination 10/31/2019, 11/28/2019, 07/26/2020   Pfizer Covid-19 Vaccine Bivalent Booster 89yrs & up 06/22/2022   Pneumococcal Conjugate-13 12/29/2013   Pneumococcal Polysaccharide-23 04/24/2010   Tdap 06/04/2018   Zoster Recombinant(Shingrix) 06/20/2017, 04/21/2018   Zoster, Live 01/13/2008    Screening Tests Health Maintenance  Topic Date Due   Hepatitis C Screening  Never done   COVID-19 Vaccine (7 - 2024-25 season) 05/19/2023   INFLUENZA VACCINE  04/17/2024   Medicare Annual Wellness (AWV)  03/25/2025   DTaP/Tdap/Td (2 - Td or Tdap) 06/04/2028   Pneumococcal Vaccine: 50+ Years  Completed   DEXA SCAN   Completed   Zoster Vaccines- Shingrix  Completed   Hepatitis B Vaccines  Aged Out   HPV VACCINES  Aged Out   Meningococcal B Vaccine  Aged Out   Colonoscopy  Discontinued    Health Maintenance  Health Maintenance Due  Topic Date Due   Hepatitis C Screening  Never done   COVID-19 Vaccine (7 - 2024-25 season) 05/19/2023   Health Maintenance Items Addressed: See Nurse Notes at the end of this note  Additional Screening:  Vision Screening: Recommended annual ophthalmology exams for early detection of glaucoma and other disorders of the eye. Would you like a referral to an eye doctor? No    Dental Screening: Recommended annual dental exams for proper oral hygiene  Community Resource Referral / Chronic Care Management: CRR required this visit?  No   CCM required this visit?  No   Plan:    I have personally reviewed and noted the following in the patient's chart:   Medical and social history Use of alcohol, tobacco or illicit drugs  Current medications and supplements including opioid prescriptions. Patient is not currently taking opioid prescriptions. Functional ability and status Nutritional status Physical activity Advanced directives List of other physicians Hospitalizations, surgeries, and ER visits in previous 12 months Vitals Screenings to include cognitive, depression, and falls Referrals and appointments  In addition, I have reviewed and discussed with patient certain preventive protocols, quality metrics, and best practice recommendations. A written personalized care plan for preventive services as well as general preventive health recommendations were provided to patient.   Eyob Godlewski L Hason Ofarrell, CMA   03/25/2024   After Visit Summary: (MyChart) Due to this being a telephonic visit, the after visit summary with patients personalized plan was offered to patient via MyChart   Notes: Patient has complaints about her chronic diarrhea today.  She stated that she would  like to get to the bottom of why she is having incontinence more often and why is it diarrhea form.  Patient stated that the medication that Dr. Rollene prescribed helps some but very little (cholestyramine  (QUESTRAN ).  Patient is wanting to know if she could be seen by a specialist for the diarrhea happening too often.  I informed patient that I would inform Dr. Rollene and see what she thinks if she had any other suggestions about patient seeing a specialist.  Please advise.  Patient is due for a Hep C screening.

## 2024-03-26 NOTE — Telephone Encounter (Signed)
 Please schedule office visit with Dr. Rollene to discuss the problem.  Thank you

## 2024-03-27 ENCOUNTER — Telehealth: Payer: Self-pay

## 2024-03-27 NOTE — Telephone Encounter (Signed)
 Copied from CRM [id number]. Topic: Appointments - Appointment Scheduling >> Mar 27, 2024 10:54 AM Franky GRADE wrote: Patient/patient representative is calling to schedule an appointment. Refer to attachments for appointment information. Patient received a message to schedule an appointment regarding chronic diarrhea; however, Dr.Crawford's next available date is 04/17/2024. I offered an appointment with either Corean Ku or Boby Mackintosh but patient refused to see anyone but Dr.Crawford and wanted to know if there was anyway she can be fit into her schedule prior to 04/17/2024.

## 2024-03-30 NOTE — Telephone Encounter (Signed)
 Called pt and was able to get her in to see PCP tomorrow, 7/15 at 3 pm

## 2024-03-31 ENCOUNTER — Ambulatory Visit (INDEPENDENT_AMBULATORY_CARE_PROVIDER_SITE_OTHER): Admitting: Internal Medicine

## 2024-03-31 ENCOUNTER — Encounter: Payer: Self-pay | Admitting: Internal Medicine

## 2024-03-31 VITALS — BP 128/84 | HR 83 | Temp 98.1°F | Ht 63.0 in | Wt 157.0 lb

## 2024-03-31 DIAGNOSIS — R197 Diarrhea, unspecified: Secondary | ICD-10-CM | POA: Diagnosis not present

## 2024-03-31 NOTE — Patient Instructions (Signed)
 We will get you in with a stomach doctor to see if there are other options for the diarrhea.

## 2024-03-31 NOTE — Progress Notes (Unsigned)
   Subjective:   Patient ID: Mariah Snyder, female    DOB: [birth year], 78 y.o.   MRN: [id number]  HPI The patient is a 78 YO female coming in for diarrhea. Does not always know she needs to go.   Review of Systems  Objective:  Physical Exam  Vitals:   03/31/24 1459  BP: 128/84  Pulse: 83  Temp: 98.1 F (36.7 C)  TempSrc: Oral  SpO2: 96%  Weight: 157 lb (71.2 kg)  Height: 5' 3 (1.6 m)    Assessment & Plan:

## 2024-04-01 NOTE — Assessment & Plan Note (Signed)
 She is taking cholestyramine  to help every other day and daily use caused constipation. I have recommended adding fiber to help bulk the stool to make the sensation of going more and leakage less likely. Referral to [facility] for assessment. She could benefit from pelvic floor PT potentially.

## 2024-04-07 ENCOUNTER — Telehealth: Payer: Self-pay

## 2024-04-07 NOTE — Telephone Encounter (Signed)
 Copied from CRM [id number]. Topic: Clinical - Medical Advice >> Apr 07, 2024 10:57 AM Mia F wrote: Reason for CRM: Melene is calling from [facility]. Melene states that pt was referred to their office and the doctors at the facility is denying treatment for pt due to the severity of pts symptoms. The doctors of the practice says pt should stay with [facility] since they are a bigger facility and can better suit her needs.

## 2024-04-07 NOTE — Telephone Encounter (Unsigned)
 Copied from CRM [id number]. Topic: Referral - Question >> Apr 07, 2024 12:14 PM Mercedes MATSU wrote: Reason for CRM: Patient called in stating that the [place] office [facility]) that she was referred to called and said they will not be taking her on as a patient and refused her services. Patient is requesting a call back once the referral is sent for Gastroenterologist, patients number is [phone].

## 2024-04-08 NOTE — Telephone Encounter (Unsigned)
 Copied from CRM [id number]. Topic: Referral - Question >> Apr 07, 2024 12:14 PM Mercedes MATSU wrote: Reason for CRM: Patient called in stating that the [place] office [facility]) that she was referred to called and said they will not be taking her on as a patient and refused her services. Patient is requesting a call back once the referral is sent for Gastroenterologist, patients number is [phone]. >> Apr 08, 2024 12:58 PM Mercedes MATSU wrote: Patient called in again requesting new referral information for a Gastroenterologist. Patient states that she has not received a call back yet and can be reached at [phone].

## 2024-04-09 ENCOUNTER — Telehealth: Payer: Self-pay | Admitting: Internal Medicine

## 2024-04-09 NOTE — Telephone Encounter (Unsigned)
 Copied from CRM #[id number]. Topic: Clinical - Medical Advice >> Apr 09, 2024  2:49 PM Tiffini S wrote: Reason for CRM: Patient is request talk with pcp about referral that was not accepted for  [facility]- said that it is unacceptable for her to call her insurance or [facility] for a reason why she was denied- states that her insurance covers all GI doctors. Please call back at [phone].

## 2024-04-09 NOTE — Telephone Encounter (Signed)
Responded to patient in mychart.

## 2024-04-09 NOTE — Telephone Encounter (Signed)
 Patient would like to be referred to [facility]

## 2024-04-09 NOTE — Telephone Encounter (Signed)
 Copied from CRM [id number]. Topic: Referral - Question >> Apr 09, 2024 10:01 AM Laymon HERO wrote: Reason for CRM: Patient calling to get status on another Gi doctor. She has been calling and said she is getting the run around and no one will help her find another doctor that would take her as a patient.

## 2024-04-10 NOTE — Addendum Note (Signed)
 Addended by: ROSALVA LEX RAMAN on: 04/10/2024 10:17 AM   Modules accepted: Orders

## 2024-04-10 NOTE — Telephone Encounter (Signed)
 Ok to place referral to [facility]?

## 2024-04-23 NOTE — Telephone Encounter (Signed)
 Copied from CRM [id number]. Topic: Referral - Status >> Apr 23, 2024  1:18 PM Lavanda D wrote: Reason for CRM: Referral to [facility], they have refused to take her due to being a second opinion. She also had an [facility] referral, they said she needs a higher level of care even within [place]. Patient is wondering what can be done, is there anywhere else that this referral can be sent, she would like Dr. Marcel. She is very frustrated and states that she really needs help getting this issue under control, she said it is very embarrassing for her. Patient would like a phone call to verify what the course of action is.

## 2024-04-23 NOTE — Telephone Encounter (Signed)
**Note De-identified  [place] Obfuscation** Please advise 

## 2024-04-24 ENCOUNTER — Telehealth: Payer: Self-pay

## 2024-04-24 ENCOUNTER — Telehealth: Payer: Self-pay | Admitting: Internal Medicine

## 2024-04-24 DIAGNOSIS — R197 Diarrhea, unspecified: Secondary | ICD-10-CM

## 2024-04-24 NOTE — Telephone Encounter (Signed)
 Copied from CRM #[id number]. Topic: Clinical - Medical Advice >> Apr 24, 2024 11:36 AM Jasmin G wrote: Reason for CRM: Pt called due to recent referrals made to a couple gastroenterologists, (Please refer to Encounters from 7/22-7/25), she states that for both clinics she has been denied care due to needing a much higher level of care, pt states that she has been dealing with diarrhea for about 2 years and there has been a lot of miscommunication for referrals, recently she was referred to [facility] clinic to obtain a second opinion, but she states she does not want a second opinion, she needs the care. Pt would very much appreciate a call back from PCP, Dr. Rollene,  to establish best course of action according to her needs as she thinks that there has been a lot of miscommunication between staff, as she also stated that she's is a 78 year old woman that constantly dirties her pants due to the diarrhea.

## 2024-04-24 NOTE — Telephone Encounter (Signed)
 Routed to provider about GI concerns

## 2024-04-24 NOTE — Telephone Encounter (Unsigned)
 Copied from CRM [id number]. Topic: General - Other >> Apr 24, 2024  4:02 PM Gennette ORN wrote: Reason for CRM: Patient is calling to speak to [facility] because she needs more clarification on what they are talking about on my chart. Please call patient back.

## 2024-04-27 NOTE — Telephone Encounter (Signed)
 Copied from CRM [id number]. Topic: General - Other >> Apr 24, 2024  4:02 PM Gennette ORN wrote: Reason for CRM: Patient is calling to speak to [facility] because she needs more clarification on what they are talking about on my chart. Please call patient back. >> Apr 27, 2024  3:15 PM Armenia J wrote: Patient calling to get an update. I was able to relay Dr Marcel message to her and she said that she is willing to go to [facility]. She wants to make clear that she does not have a GI and she would like a referral to be seen at [facility] as soon as possible.

## 2024-04-27 NOTE — Telephone Encounter (Signed)
 See mychart message but maybe referrals could help us  get her in at [facility]. I do not have any sway with if they decide to take her or not. We could try [facility] or [facility] if she would like to try there instead?

## 2024-04-28 NOTE — Telephone Encounter (Signed)
 Patient walked into office upset this afternoon wanting an update on her referral. Spoke with patient in detail, along with Chiquita T. CMA to assure her the details of her updated referral will be handled appropriately and she will receive a call by the end of this week from one of us  with an update.  Patient was hesitant but appreciative for the details and follow up.

## 2024-04-28 NOTE — Telephone Encounter (Signed)
 Referral done for GI at [facility].

## 2024-04-28 NOTE — Telephone Encounter (Signed)
 I spoke w [facility] and they told me they denied her referral because she was seen somewhere else in the last 3 yrs. I relayed to them the pt was needing to be seen bc her last provider has now left the practice and they said in order for them to reconsider they will need a new referral placed to them indicating her last provider left and then they can re-review the referral.   I see you just placed a referral to [facility], do you want to move forward w that one or have us  replace the [facility] referral and indicate her last GI provider left?

## 2024-04-29 ENCOUNTER — Other Ambulatory Visit: Payer: Self-pay | Admitting: Internal Medicine

## 2024-04-29 ENCOUNTER — Encounter: Payer: Self-pay | Admitting: Neurology

## 2024-04-29 ENCOUNTER — Other Ambulatory Visit: Payer: Self-pay

## 2024-04-29 DIAGNOSIS — G250 Essential tremor: Secondary | ICD-10-CM

## 2024-04-29 NOTE — Telephone Encounter (Signed)
 This was already addressed with the patient and there were no questions or concerns

## 2024-04-30 ENCOUNTER — Other Ambulatory Visit: Payer: Self-pay

## 2024-04-30 DIAGNOSIS — G250 Essential tremor: Secondary | ICD-10-CM

## 2024-04-30 MED ORDER — PRIMIDONE 50 MG PO TABS: ORAL_TABLET | ORAL | 0 refills | Status: AC

## 2024-05-01 ENCOUNTER — Telehealth: Payer: Self-pay

## 2024-05-01 DIAGNOSIS — R197 Diarrhea, unspecified: Secondary | ICD-10-CM

## 2024-05-01 NOTE — Telephone Encounter (Signed)
 Followed up with patient, as promised earkier this week, related to her [facility] GI Referral.   Informed patient that referral was processed and to allow 5 business days for them to contact her to schedule her New Patient appointment. Let her her know that I spoke with them and their next NP appt is in Dec/Jan of 2025-2026.  Patient thankful for call, no additional questions at this time.

## 2024-05-04 ENCOUNTER — Ambulatory Visit: Admitting: Neurology

## 2024-05-10 ENCOUNTER — Encounter: Payer: Self-pay | Admitting: Internal Medicine

## 2024-05-15 ENCOUNTER — Ambulatory Visit: Admitting: Podiatry

## 2024-05-15 ENCOUNTER — Encounter: Payer: Self-pay | Admitting: Podiatry

## 2024-05-15 DIAGNOSIS — B351 Tinea unguium: Secondary | ICD-10-CM

## 2024-05-15 DIAGNOSIS — M79675 Pain in left toe(s): Secondary | ICD-10-CM | POA: Diagnosis not present

## 2024-05-15 DIAGNOSIS — M79674 Pain in right toe(s): Secondary | ICD-10-CM | POA: Diagnosis not present

## 2024-05-15 NOTE — Progress Notes (Signed)
 Subjective: Chief Complaint  Patient presents with   RFC     RFC toenail trim and callus care. Non diabetic. 0 pain.   78 year old female with the above concerns.  She is nails are thickened elongated she not able to trim them herself.  She does not report any other concerns today.  No open lesions. Objective: AAO x3, NAD DP/PT pulses palpable bilaterally, CRT less than 3 seconds Nails are hypertrophic, dystrophic, brittle, discolored, elongated 10.  Incurvation of the nails without any signs of infection.  Tenderness nails 1-5 bilaterally.  No open lesions or pre-ulcerative lesions are identified today.   No pain with calf compression, swelling, warmth, erythema  Assessment: Ingrown toenail, symptomatic onychomycosis  Plan: -All treatment options discussed with the patient including all alternatives, risks, complications.  -Sharply debrided nails x10 without any complications or bleeding.  -Patient encouraged to call the office with any questions, concerns, change in symptoms.   Return in about 9 weeks (around 07/17/2024) for nail trim .  Mariah Snyder DPM

## 2024-05-15 NOTE — Telephone Encounter (Signed)
 Spoke with patient on Monday, 8/25 as she had called [facility] and they told her her case was being evaluated. She requested if we could help move it through the process. Told patient that I would call on her behalf to [facility] and get back with her before end of day on Friday.    Patient arrived in office 11am on Friday as she had not gotten an updated. I called [facility] again spoke with representative who imformed me that the Referral has been closed as they are not accepting new patients, she can call back in 6 months to check if there is avaliability. I also reached out to [facility], where she previously has been seen, and they are able to get her in on 9/17 @ 930am. Patient was informed of all information, declined [facility], as she is never going back there.  She requested referral to [facility], informed her that we will putin that referral and they will be in touch once they review her files. Patient very frustrated and stated if they don't take me then I will go back to my old PCP at [facility] and she will get me in. Informed patient she has a choice in her care and we will understand and respect any decision she makes.

## 2024-05-25 NOTE — Progress Notes (Unsigned)
 Assessment/Plan:    1.  Tremor              -She has become more frustrated with leg and hand tremor, which certainly may be dystonic in nature as well, especially since it is not responding well to primidone /propranolol .  She and I discussed this today.  She has not found higher dosages of primidone  to be more effective and has found she has more cognitive dulling.  -She did not think that her first pattern of injections with Botox  was very helpful.  I would change the pattern of injections to involve the right>> left SCM and left splenius capitis.  I will also try to increase the dose.  -Discussed weighted gloves.             -For now we will continue propranolol , 20 mg, 2 tablets at bedtime             - She will continue primidone , 50 mg, 2 tablets twice per day.             -not interested in dbs right now but may be interested in focused ultrasound in the future.             -Has had kidney stones so really is not a good candidate for topiramate.             -pt has been seen by Dr. Hezzie [facility]) and by Dr. Katheryn Moore (movement at [facility]).  She felt she did not have a good experience at [facility].     2.  Possible TIA, March, 2022             -Event was very short-lived (seconds) so it is questionable whether this really represented TIA.  Nonetheless, Dr. Hezzie at [facility] worked this up extensively.  MRI and MRA were negative for anything acute.  Zio patch was completed and was unremarkable.  Patient now on aspirin, 81 mg daily.   3.  Gait spasticity             -Patient has had a workup for this that was unrevealing.  Symptoms started in 2018.    -MRI brain and cervical spine recently were unrevealing as recently as 2024.  MRI of the lumbar spine was also done in 2023..  4.  Hx of anxiety/adjustment d/o  -She would really like to limit her medication load.  Her BuSpar  was added when her husband died 4 years ago.  She asks me about that today.  She asks if it helps or hurts tremor.  It  really does not do either and I have no objection, from my standpoint, if it is discontinued.  I told her to discuss with her primary care physician, who is prescribing it.  5.  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  They did not want to do injections until they saw if Botox  was helpful.  She is going to try Botox  1 more time and if not helpful, to go back to neurosurgery for injections.  Subjective:   Richa Shor was seen today in follow up.  Patient is on primidone  and propranolol  for tremor.  She is also utilizing Botox  for head tremor.  Her last injections were in March.  We called her after that injection and she stated that she was still having severe pain in the muscles in her neck and had been referred to a surgeon about the muscles  in her neck.  She decided to hold off on Botox .  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  There are no new notes today that I see from neurosurgery or orthopedics, but the patient reports that ***.  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  Prior cervical spine MRI 2015 at [facility]:  degen changes with ? Cord signal change at C5 MRI Lumbar 10/2021:  [facility]  ALLERGIES:   Allergies  Allergen Reactions   Doxycycline      N/V, diarrhea   Sulfa Antibiotics Other (See Comments)    Causes headaches.     Grass Extracts [Gramineae Pollens] Itching and Other (See Comments)    Runny itchy eyes & nose.   Molds & Smuts Itching and Other (See Comments)    Runny itchy eyes & nose.    CURRENT MEDICATIONS:  No outpatient medications have been marked as taking for the 05/26/24 encounter (Appointment) with Raylen Ken, [place] RAMAN,  DO.      Objective:    PHYSICAL EXAMINATION:    VITALS:   There were no vitals filed for this visit.    GEN:  The patient appears stated age and is in NAD. HEENT:  Normocephalic, atraumatic.  The mucous membranes are moist.  Cardiovascular: Regular rate rhythm Lungs: Clear to auscultation bilaterally Neck/HEME:  There are no carotid bruits bilaterally. Head tremor in the yes direction but head  Head appears to be more to the left at this time  Neurological examination:  Orientation: The patient is alert and oriented x3. Cranial nerves: There is good facial symmetry. The speech is fluent and clear. Soft palate rises symmetrically and there is no tongue deviation. Hearing is intact to conversational tone. Sensation: Sensation is intact to light touch throughout.  Vibration intact bilateral ankle Motor: Strength is at least antigravity x4.   Movement examination: Tone: There is normal tone in the UE/LE Abnormal movements: there is head tremor, as above.  There is minimal postural tremor, mild, RUE>LUE.  Coordination:  There is no decremation with RAM's Gait and Station: Not tested today I have reviewed and interpreted the following labs independently   Chemistry      Component Value Date/Time   NA 140 07/23/2023 1027   K 4.1 07/23/2023 1027   CL 104 07/23/2023 1027   CO2 29 07/23/2023 1027   BUN 17 07/23/2023 1027   CREATININE 0.77 07/23/2023 1027      Component Value Date/Time   CALCIUM 9.6 07/23/2023 1027   ALKPHOS 80 07/23/2023 1027   AST 18 07/23/2023 1027   ALT 16 07/23/2023 1027   BILITOT 0.3 07/23/2023 1027      Lab Results  Component Value Date   WBC 6.6 07/23/2023   HGB 13.7 07/23/2023   HCT 42.5 07/23/2023   MCV 90.0 07/23/2023   PLT 275.0 07/23/2023   Lab Results  Component Value Date   TSH 1.14 01/20/2024     Chemistry      Component Value Date/Time   NA 140 07/23/2023 1027   K 4.1 07/23/2023 1027   CL 104 07/23/2023 1027   CO2 29  07/23/2023 1027   BUN 17 07/23/2023 1027   CREATININE 0.77 07/23/2023 1027  Component Value Date/Time   CALCIUM 9.6 07/23/2023 1027   ALKPHOS 80 07/23/2023 1027   AST 18 07/23/2023 1027   ALT 16 07/23/2023 1027   BILITOT 0.3 07/23/2023 1027         Total time spent on today's visit was *** minutes, including both face-to-face time and nonface-to-face time.  Time included that spent on review of records (prior notes available to me/labs/imaging if pertinent), discussing treatment and goals, answering patient's questions and coordinating care.  Cc:  Rollene Almarie LABOR, MD

## 2024-05-26 ENCOUNTER — Ambulatory Visit (INDEPENDENT_AMBULATORY_CARE_PROVIDER_SITE_OTHER): Admitting: Neurology

## 2024-05-26 ENCOUNTER — Other Ambulatory Visit: Payer: Self-pay | Admitting: Internal Medicine

## 2024-05-26 ENCOUNTER — Encounter: Payer: Self-pay | Admitting: Neurology

## 2024-05-26 ENCOUNTER — Other Ambulatory Visit: Payer: Self-pay | Admitting: Neurology

## 2024-05-26 VITALS — BP 124/82 | HR 77 | Ht 62.0 in | Wt 156.8 lb

## 2024-05-26 DIAGNOSIS — M542 Cervicalgia: Secondary | ICD-10-CM

## 2024-05-26 DIAGNOSIS — G250 Essential tremor: Secondary | ICD-10-CM | POA: Diagnosis not present

## 2024-05-26 DIAGNOSIS — G243 Spasmodic torticollis: Secondary | ICD-10-CM

## 2024-06-03 ENCOUNTER — Encounter: Payer: Self-pay | Admitting: Internal Medicine

## 2024-06-04 ENCOUNTER — Other Ambulatory Visit: Payer: Self-pay | Admitting: Family

## 2024-06-04 MED ORDER — PREGABALIN 75 MG PO CAPS: 75.0000 mg | ORAL_CAPSULE | Freq: Two times a day (BID) | ORAL | 1 refills | Status: AC

## 2024-06-04 NOTE — Telephone Encounter (Signed)
 Please send in to local pharmacy on file for patient please

## 2024-06-11 ENCOUNTER — Ambulatory Visit: Admitting: Neurology

## 2024-06-17 ENCOUNTER — Other Ambulatory Visit: Payer: Self-pay

## 2024-06-17 MED ORDER — MIRABEGRON ER 50 MG PO TB24: 50.0000 mg | ORAL_TABLET | Freq: Every day | ORAL | 0 refills | Status: AC

## 2024-06-19 ENCOUNTER — Ambulatory Visit: Admitting: Neurology

## 2024-07-17 ENCOUNTER — Ambulatory Visit (INDEPENDENT_AMBULATORY_CARE_PROVIDER_SITE_OTHER): Admitting: Podiatry

## 2024-07-17 DIAGNOSIS — M79674 Pain in right toe(s): Secondary | ICD-10-CM | POA: Diagnosis not present

## 2024-07-17 DIAGNOSIS — M79675 Pain in left toe(s): Secondary | ICD-10-CM

## 2024-07-17 DIAGNOSIS — B351 Tinea unguium: Secondary | ICD-10-CM

## 2024-07-17 NOTE — Progress Notes (Signed)
 Subjective: Chief Complaint  Patient presents with   Nail Problem    Patient is here for routine nail care for Pain due to onychomycosis of toenails of both feet      78 year old female with the above concerns.  She is nails are thickened elongated she not able to trim them herself.  She does not report any other concerns today.  No open lesions.  Objective: AAO x3, NAD DP/PT pulses palpable bilaterally, CRT less than 3 seconds Nails are hypertrophic, dystrophic, brittle, discolored, elongated 10.  Incurvation of the nails without any signs of infection.  Tenderness nails 1-5 bilaterally.  No open lesions or pre-ulcerative lesions are identified today.   No pain with calf compression, swelling, warmth, erythema  Assessment: Ingrown toenail, symptomatic onychomycosis  Plan: -All treatment options discussed with the patient including all alternatives, risks, complications.  -Sharply debrided nails x10 without any complications or bleeding.  -Patient encouraged to call the office with any questions, concerns, change in symptoms.   Return in about 2 months (around 09/16/2024).  Mariah Snyder DPM

## 2024-07-28 ENCOUNTER — Ambulatory Visit: Admitting: Neurology

## 2024-09-15 ENCOUNTER — Encounter: Payer: Self-pay | Admitting: Podiatry

## 2024-09-15 ENCOUNTER — Ambulatory Visit (INDEPENDENT_AMBULATORY_CARE_PROVIDER_SITE_OTHER): Admitting: Podiatry

## 2024-09-15 DIAGNOSIS — B351 Tinea unguium: Secondary | ICD-10-CM

## 2024-09-15 DIAGNOSIS — M79674 Pain in right toe(s): Secondary | ICD-10-CM

## 2024-09-15 DIAGNOSIS — M79675 Pain in left toe(s): Secondary | ICD-10-CM

## 2024-09-16 NOTE — Progress Notes (Signed)
 Subjective: Chief Complaint  Patient presents with   Ingrown Toenail    F/U Ingrown toenail, symptomatic onychomycosis. RFC Non diabetic toenail trim. 0 pain.    78 year old female with the above concerns.  She is nails are thickened elongated she not able to trim them herself.  She does not report any other concerns today.  No open lesions.  Objective: AAO x3, NAD DP/PT pulses palpable bilaterally, CRT less than 3 seconds Nails are hypertrophic, dystrophic, brittle, discolored, elongated 10.  Incurvation of the nails without any signs of infection.  Tenderness nails 1-5 bilaterally.  No open lesions or pre-ulcerative lesions are identified today.   No pain with calf compression, swelling, warmth, erythema  Assessment: Ingrown toenail, symptomatic onychomycosis  Plan: -All treatment options discussed with the patient including all alternatives, risks, complications.  -Sharply debrided nails x10 without any complications or bleeding.  -Patient encouraged to call the office with any questions, concerns, change in symptoms.   Return in about 2 months (around 11/16/2024) for nail trim .  Mariah Snyder DPM

## 2024-11-17 ENCOUNTER — Ambulatory Visit: Admitting: Podiatry

## 2024-12-07 ENCOUNTER — Ambulatory Visit: Admitting: Neurology

## 2025-03-26 ENCOUNTER — Ambulatory Visit
